# Patient Record
Sex: Male | Born: 1945 | Race: White | Hispanic: No | Marital: Married | Smoking: Former smoker
Health system: Southern US, Community
[De-identification: ages and names within clinical notes are randomized; demographics above are authoritative.]

## PROBLEM LIST (undated history)

## (undated) DIAGNOSIS — E119 Type 2 diabetes mellitus without complications: Secondary | ICD-10-CM

## (undated) DIAGNOSIS — I10 Essential (primary) hypertension: Secondary | ICD-10-CM

## (undated) DIAGNOSIS — J449 Chronic obstructive pulmonary disease, unspecified: Secondary | ICD-10-CM

## (undated) HISTORY — PX: CATARACT EXTRACTION: SUR2

## (undated) HISTORY — PX: CORONARY STENT PLACEMENT: SHX1402

## (undated) HISTORY — PX: COLONOSCOPY: SHX174

## (undated) HISTORY — PX: APPENDECTOMY: SHX54

## (undated) HISTORY — PX: CORONARY ARTERY BYPASS GRAFT: SHX141

## (undated) HISTORY — PX: WRIST SURGERY: SHX841

## (undated) HISTORY — PX: TOTAL KNEE ARTHROPLASTY: SHX125

---

## 2004-01-19 HISTORY — PX: CORONARY STENT PLACEMENT: SHX1402

## 2005-08-16 ENCOUNTER — Other Ambulatory Visit: Payer: Self-pay

## 2005-08-16 ENCOUNTER — Emergency Department: Payer: Self-pay

## 2009-10-08 ENCOUNTER — Emergency Department: Payer: Self-pay | Admitting: Emergency Medicine

## 2013-01-18 DIAGNOSIS — I2699 Other pulmonary embolism without acute cor pulmonale: Secondary | ICD-10-CM

## 2013-01-18 HISTORY — DX: Other pulmonary embolism without acute cor pulmonale: I26.99

## 2014-09-19 ENCOUNTER — Encounter: Payer: Self-pay | Admitting: *Deleted

## 2014-09-19 ENCOUNTER — Emergency Department
Admission: EM | Admit: 2014-09-19 | Discharge: 2014-09-19 | Disposition: A | Discharge status: Home / Self Care | Payer: Commercial Managed Care - HMO | Attending: Emergency Medicine | Admitting: Emergency Medicine

## 2014-09-19 ENCOUNTER — Other Ambulatory Visit: Payer: Self-pay

## 2014-09-19 DIAGNOSIS — I209 Angina pectoris, unspecified: Secondary | ICD-10-CM | POA: Diagnosis not present

## 2014-09-19 DIAGNOSIS — R079 Chest pain, unspecified: Secondary | ICD-10-CM | POA: Diagnosis present

## 2014-09-19 DIAGNOSIS — I208 Other forms of angina pectoris: Secondary | ICD-10-CM

## 2014-09-19 LAB — COMPREHENSIVE METABOLIC PANEL
ALK PHOS: 94 U/L (ref 38–126)
ALT: 35 U/L (ref 17–63)
AST: 34 U/L (ref 15–41)
Albumin: 4.5 g/dL (ref 3.5–5.0)
Anion gap: 11 (ref 5–15)
BUN: 25 mg/dL — AB (ref 6–20)
CALCIUM: 9.1 mg/dL (ref 8.9–10.3)
CO2: 22 mmol/L (ref 22–32)
CREATININE: 1.27 mg/dL — AB (ref 0.61–1.24)
Chloride: 103 mmol/L (ref 101–111)
GFR calc non Af Amer: 56 mL/min — ABNORMAL LOW (ref >60)
Glucose, Bld: 169 mg/dL — ABNORMAL HIGH (ref 65–99)
Potassium: 3.8 mmol/L (ref 3.5–5.1)
SODIUM: 136 mmol/L (ref 135–145)
Total Bilirubin: 0.4 mg/dL (ref 0.3–1.2)
Total Protein: 7.7 g/dL (ref 6.5–8.1)

## 2014-09-19 LAB — TROPONIN I: Troponin I: <0.03 ng/mL (ref <0.031)

## 2014-09-19 LAB — CBC
HCT: 42.1 % (ref 40.0–52.0)
HEMOGLOBIN: 13.8 g/dL (ref 13.0–18.0)
MCH: 29.3 pg (ref 26.0–34.0)
MCHC: 32.9 g/dL (ref 32.0–36.0)
MCV: 89.2 fL (ref 80.0–100.0)
Platelets: 304 10*3/uL (ref 150–440)
RBC: 4.72 MIL/uL (ref 4.40–5.90)
RDW: 15.7 % — ABNORMAL HIGH (ref 11.5–14.5)
WBC: 6.8 10*3/uL (ref 3.8–10.6)

## 2014-09-19 MED ORDER — ACETAMINOPHEN 325 MG PO TABS
650.0000 mg | ORAL_TABLET | Freq: Once | ORAL | Status: AC
  Administered 2014-09-19: 650 mg via ORAL

## 2014-09-19 MED ORDER — ACETAMINOPHEN 325 MG PO TABS
ORAL_TABLET | ORAL | Status: AC
  Administered 2014-09-19: 650 mg via ORAL
  Filled 2014-09-19: qty 2

## 2014-09-19 NOTE — ED Notes (Signed)
Pt to triage via wheelchair.  Pt has intermittent chest pain since last night.  Pt took ntg last night and pain went away.  Pain reoccurred today. Pt took another ntg 45 minutes ago.  No n/v/d.  Pt had episodes of diaphoresis.

## 2014-09-19 NOTE — Discharge Instructions (Signed)
Angina Pectoris  Angina pectoris, often just called angina, is extreme discomfort in your chest, neck, or arm caused by a lack of blood in the middle and thickest layer of your heart wall (myocardium). It may feel like tightness or heavy pressure. It may feel like a crushing or squeezing pain. Some people say it feels like gas or indigestion. It may go down your shoulders, back, and arms. Some people may have symptoms other than pain. These symptoms include fatigue, shortness of breath, cold sweats, or nausea. There are four different types of angina:  · Stable angina--Stable angina usually occurs in episodes of predictable frequency and duration. It usually is brought on by physical activity, emotional stress, or excitement. These are all times when the myocardium needs more oxygen. Stable angina usually lasts a few minutes and often is relieved by taking a medicine that can be taken under your tongue (sublingually). The medicine is called nitroglycerin. Stable angina is caused by a buildup of plaque inside the arteries, which restricts blood flow to the heart muscle (atherosclerosis).  · Unstable angina--Unstable angina can occur even when your body experiences little or no physical exertion. It can occur during sleep. It can also occur at rest. It can suddenly increase in severity or frequency. It might not be relieved by sublingual nitroglycerin. It can last up to 30 minutes. The most common cause of unstable angina is a blood clot that has developed on the top of plaque buildup inside a coronary artery. It can lead to a heart attack if the blood clot completely blocks the artery.  · Microvascular angina--This type of angina is caused by a disorder of tiny blood vessels called arterioles. Microvascular angina is more common in women. The pain may be more severe and last longer than other types of angina pectoris.  · Prinzmetal or variant angina--This type of angina pectoris usually occurs when your body  experiences little or no physical exertion. It especially occurs in the early morning hours. It is caused by a spasm of your coronary artery.  HOME CARE INSTRUCTIONS   · Only take over-the-counter and prescription medicines as directed by your health care provider.  · Stay active or increase your exercise as directed by your health care provider.  · Limit strenuous activity as directed by your health care provider.  · Limit heavy lifting as directed by your health care provider.  · Maintain a healthy weight.  · Learn about and eat heart-healthy foods.  · Do not use any tobacco products including cigarettes, chewing tobacco or electronic cigarettes.  SEEK IMMEDIATE MEDICAL CARE IF:   You experience the following symptoms:  · Chest, neck, deep shoulder, or arm pain or discomfort that lasts more than a few minutes.  · Chest, neck, deep shoulder, or arm pain or discomfort that goes away and comes back, repeatedly.  · Heavy sweating with discomfort, without a noticeable cause.  · Shortness of breath or difficulty breathing.  · Angina that does not get better after a few minutes of rest or after taking sublingual nitroglycerin.  These can all be symptoms of a heart attack, which is a medical emergency! Get medical help at once. Call your local emergency service (911 in [place] immediately. Do not  drive yourself to the hospital and do not  wait to for your symptoms to go away.  MAKE SURE YOU:  · Understand these instructions.  · Will watch your condition.  · Will get help right away if you are not   doing well or get worse.  Document Released: 01/04/2005 Document Revised: 01/09/2013 Document Reviewed: 05/08/2013  Exit[employer]® Patient Information ©2015 [employer]. This information is not intended to replace advice given to you by your health care provider. Make sure you discuss any questions you have with your health care provider.

## 2014-09-19 NOTE — ED Provider Notes (Signed)
[facility] Emergency Department Provider Note  ____________________________________________  Time seen: 4:50 PM  I have reviewed the triage vital signs and the nursing notes.   HISTORY  Chief Complaint Chest Pain    HPI Scott TASKER Sr. is a 69 y.o. male who complains of chest pain. He had an episode last night that was occurred while he was sitting at rest that was non-radiating describe as tightness with no shortness of breath. It felt like his usual angina so he took a nitroglycerin which resolved within a few minutes. He had no other symptoms last night so did not worry about it. Today he was accompanying his wife to her doctor's office when he became very hot and diaphoretic short of breath and recurrent of the chest pain and tightness. It was again nonradiating. He took 243 of aspirin and another nitroglycerin which caused the pain to resolve again. He is in the ED for evaluation of this recurrent pain. His primary care doctor is Cleatis Polka and he gets his care at the [place]. He denies any exertional or pleuritic symptoms recently. No recent illness.     No past medical history on file. CAD, angina  There are no active problems to display for this patient.    No past surgical history on file.   No current outpatient prescriptions on file. Nitroglycerin  Allergies Prednisone and Nitroglycerin The patient does not have a nitroglycerin allergy. He takes this at home.  No family history on file.  Social History Social History  Substance Use Topics  . Smoking status: Never Smoker   . Smokeless tobacco: Not on file  . Alcohol Use: No    Review of Systems  Constitutional:   No fever or chills. No weight changes Eyes:   No blurry vision or double vision.  ENT:   No sore throat. Cardiovascular:   Positive chest pain as above. Respiratory:   No dyspnea or cough. Gastrointestinal:   Negative for abdominal pain, vomiting and diarrhea.  No  BRBPR or melena. Genitourinary:   Negative for dysuria, urinary retention, bloody urine, or difficulty urinating. Musculoskeletal:   Negative for back pain. No joint swelling or pain. Skin:   Negative for rash. Neurological:   Negative for headaches, focal weakness or numbness. Psychiatric:  No anxiety or depression.   Endocrine:  No hot/cold intolerance, changes in energy, or sleep difficulty.  10-point ROS otherwise negative.  ____________________________________________   PHYSICAL EXAM:  VITAL SIGNS: ED Triage Vitals  Enc Vitals Group     BP 09/19/14 1641 121/75 mmHg     Pulse Rate 09/19/14 1641 93     Resp 09/19/14 1641 22     Temp 09/19/14 1641 98.1 F (36.7 C)     Temp Source 09/19/14 1641 Oral     SpO2 09/19/14 1641 95 %     Weight 09/19/14 1634 260 lb (117.935 kg)     Height 09/19/14 1634  (1.854 m)     Head Cir --      Peak Flow --      Pain Score 09/19/14 1634 2     Pain Loc --      Pain Edu? --      Excl. in GC? --      Constitutional:   Alert and oriented. Well appearing and in no distress. Eyes:   No scleral icterus. No conjunctival pallor. PERRL. EOMI ENT   Head:   Normocephalic and atraumatic.   Nose:   No  congestion/rhinnorhea. No septal hematoma   Mouth/Throat:   MMM, no pharyngeal erythema. No peritonsillar mass. No uvula shift.   Neck:   No stridor. No SubQ emphysema. No meningismus. Hematological/Lymphatic/Immunilogical:   No cervical lymphadenopathy. Cardiovascular:   RRR. Normal and symmetric distal pulses are present in all extremities. No murmurs, rubs, or gallops. Respiratory:   Normal respiratory effort without tachypnea nor retractions. Breath sounds are clear and equal bilaterally. No wheezes/rales/rhonchi. Gastrointestinal:   Soft and nontender. No distention. There is no CVA tenderness.  No rebound, rigidity, or guarding. Genitourinary:   deferred Musculoskeletal:   Nontender with normal range of motion in all  extremities. No joint effusions.  No lower extremity tenderness.  No edema. Neurologic:   Normal speech and language.  CN 2-10 normal. Motor grossly intact. No pronator drift.  Normal gait. No gross focal neurologic deficits are appreciated.  Skin:    Skin is warm, dry and intact. No rash noted.  No petechiae, purpura, or bullae. Psychiatric:   Mood and affect are normal. Speech and behavior are normal. Patient exhibits appropriate insight and judgment.  ____________________________________________    LABS (pertinent positives/negatives) (all labs ordered are listed, but only abnormal results are displayed) Labs Reviewed  CBC - Abnormal; Notable for the following:    RDW 15.7 (*)    All other components within normal limits  COMPREHENSIVE METABOLIC PANEL - Abnormal; Notable for the following:    Glucose, Bld 169 (*)    BUN 25 (*)    Creatinine, Ser 1.27 (*)    GFR calc non Af Amer 56 (*)    All other components within normal limits  TROPONIN I  TROPONIN I   ____________________________________________   EKG  Interpreted by me Normal sinus rhythm rate 86, normal axis intervals.  There is poor R progression in the anterior precordial leads. There is an incomplete right bundle branch block. Normal ST segments and T waves.  ____________________________________________    RADIOLOGY    ____________________________________________   PROCEDURES   ____________________________________________   INITIAL IMPRESSION / ASSESSMENT AND PLAN / ED COURSE  Pertinent labs & imaging results that were available during my care of the patient were reviewed by me and considered in my medical decision making (see chart for details).  Patient presents with episodes of chest pain at rest. He is now chest pain-free after taking a single nitroglycerin. We'll check troponin 2 and if he remains chest pain-free plan for outpatient follow-up.  ----------------------------------------- 8:39  PM on 09/19/2014 -----------------------------------------  Initial labs and repeat troponin all negative. Patient has not had any recurrent chest pain in the emergency department. As symptoms have been brief and rapidly resolved with a single dose of nitroglycerin and not associated with any changes in cardiac enzymes or EKG, the patient's episodes are low risk and suitable for outpatient follow-up. Instructed him to follow up with the [facility] cardiology clinic tomorrow, and if unable to see them he should pursue follow-up with our local cardiologists. Low suspicion for ACS PE TAD pneumothorax carditis mediastinitis pneumonia or sepsis. No evidence of AAA or abdominal pathology.   ____________________________________________   FINAL CLINICAL IMPRESSION(S) / ED DIAGNOSES  Final diagnoses:  Angina at rest      Sharman Cheek, MD 09/19/14 2041

## 2015-01-20 DIAGNOSIS — I5042 Chronic combined systolic (congestive) and diastolic (congestive) heart failure: Secondary | ICD-10-CM | POA: Diagnosis not present

## 2015-01-20 DIAGNOSIS — F419 Anxiety disorder, unspecified: Secondary | ICD-10-CM | POA: Diagnosis not present

## 2015-01-20 DIAGNOSIS — J449 Chronic obstructive pulmonary disease, unspecified: Secondary | ICD-10-CM | POA: Diagnosis not present

## 2015-01-20 DIAGNOSIS — I110 Hypertensive heart disease with heart failure: Secondary | ICD-10-CM | POA: Diagnosis not present

## 2015-01-20 DIAGNOSIS — I214 Non-ST elevation (NSTEMI) myocardial infarction: Secondary | ICD-10-CM | POA: Diagnosis not present

## 2015-01-20 DIAGNOSIS — I2510 Atherosclerotic heart disease of native coronary artery without angina pectoris: Secondary | ICD-10-CM | POA: Diagnosis not present

## 2015-01-20 DIAGNOSIS — M1991 Primary osteoarthritis, unspecified site: Secondary | ICD-10-CM | POA: Diagnosis not present

## 2015-01-20 DIAGNOSIS — E119 Type 2 diabetes mellitus without complications: Secondary | ICD-10-CM | POA: Diagnosis not present

## 2015-01-20 DIAGNOSIS — Z48812 Encounter for surgical aftercare following surgery on the circulatory system: Secondary | ICD-10-CM | POA: Diagnosis not present

## 2015-01-21 DIAGNOSIS — Z48812 Encounter for surgical aftercare following surgery on the circulatory system: Secondary | ICD-10-CM | POA: Diagnosis not present

## 2015-01-21 DIAGNOSIS — I110 Hypertensive heart disease with heart failure: Secondary | ICD-10-CM | POA: Diagnosis not present

## 2015-01-21 DIAGNOSIS — M1991 Primary osteoarthritis, unspecified site: Secondary | ICD-10-CM | POA: Diagnosis not present

## 2015-01-21 DIAGNOSIS — E119 Type 2 diabetes mellitus without complications: Secondary | ICD-10-CM | POA: Diagnosis not present

## 2015-01-21 DIAGNOSIS — F419 Anxiety disorder, unspecified: Secondary | ICD-10-CM | POA: Diagnosis not present

## 2015-01-21 DIAGNOSIS — J449 Chronic obstructive pulmonary disease, unspecified: Secondary | ICD-10-CM | POA: Diagnosis not present

## 2015-01-21 DIAGNOSIS — I214 Non-ST elevation (NSTEMI) myocardial infarction: Secondary | ICD-10-CM | POA: Diagnosis not present

## 2015-01-21 DIAGNOSIS — I5042 Chronic combined systolic (congestive) and diastolic (congestive) heart failure: Secondary | ICD-10-CM | POA: Diagnosis not present

## 2015-01-21 DIAGNOSIS — I2510 Atherosclerotic heart disease of native coronary artery without angina pectoris: Secondary | ICD-10-CM | POA: Diagnosis not present

## 2015-01-22 DIAGNOSIS — I214 Non-ST elevation (NSTEMI) myocardial infarction: Secondary | ICD-10-CM | POA: Diagnosis not present

## 2015-01-22 DIAGNOSIS — I110 Hypertensive heart disease with heart failure: Secondary | ICD-10-CM | POA: Diagnosis not present

## 2015-01-22 DIAGNOSIS — J449 Chronic obstructive pulmonary disease, unspecified: Secondary | ICD-10-CM | POA: Diagnosis not present

## 2015-01-22 DIAGNOSIS — M1991 Primary osteoarthritis, unspecified site: Secondary | ICD-10-CM | POA: Diagnosis not present

## 2015-01-22 DIAGNOSIS — E119 Type 2 diabetes mellitus without complications: Secondary | ICD-10-CM | POA: Diagnosis not present

## 2015-01-22 DIAGNOSIS — F419 Anxiety disorder, unspecified: Secondary | ICD-10-CM | POA: Diagnosis not present

## 2015-01-22 DIAGNOSIS — I2510 Atherosclerotic heart disease of native coronary artery without angina pectoris: Secondary | ICD-10-CM | POA: Diagnosis not present

## 2015-01-22 DIAGNOSIS — Z48812 Encounter for surgical aftercare following surgery on the circulatory system: Secondary | ICD-10-CM | POA: Diagnosis not present

## 2015-01-22 DIAGNOSIS — I5042 Chronic combined systolic (congestive) and diastolic (congestive) heart failure: Secondary | ICD-10-CM | POA: Diagnosis not present

## 2015-01-26 DIAGNOSIS — F419 Anxiety disorder, unspecified: Secondary | ICD-10-CM | POA: Diagnosis not present

## 2015-01-26 DIAGNOSIS — M1991 Primary osteoarthritis, unspecified site: Secondary | ICD-10-CM | POA: Diagnosis not present

## 2015-01-26 DIAGNOSIS — J449 Chronic obstructive pulmonary disease, unspecified: Secondary | ICD-10-CM | POA: Diagnosis not present

## 2015-01-26 DIAGNOSIS — I2510 Atherosclerotic heart disease of native coronary artery without angina pectoris: Secondary | ICD-10-CM | POA: Diagnosis not present

## 2015-01-26 DIAGNOSIS — E119 Type 2 diabetes mellitus without complications: Secondary | ICD-10-CM | POA: Diagnosis not present

## 2015-01-26 DIAGNOSIS — Z48812 Encounter for surgical aftercare following surgery on the circulatory system: Secondary | ICD-10-CM | POA: Diagnosis not present

## 2015-01-26 DIAGNOSIS — I110 Hypertensive heart disease with heart failure: Secondary | ICD-10-CM | POA: Diagnosis not present

## 2015-01-26 DIAGNOSIS — I5042 Chronic combined systolic (congestive) and diastolic (congestive) heart failure: Secondary | ICD-10-CM | POA: Diagnosis not present

## 2015-01-26 DIAGNOSIS — I214 Non-ST elevation (NSTEMI) myocardial infarction: Secondary | ICD-10-CM | POA: Diagnosis not present

## 2015-01-27 DIAGNOSIS — E119 Type 2 diabetes mellitus without complications: Secondary | ICD-10-CM | POA: Diagnosis not present

## 2015-01-27 DIAGNOSIS — I214 Non-ST elevation (NSTEMI) myocardial infarction: Secondary | ICD-10-CM | POA: Diagnosis not present

## 2015-01-27 DIAGNOSIS — I5042 Chronic combined systolic (congestive) and diastolic (congestive) heart failure: Secondary | ICD-10-CM | POA: Diagnosis not present

## 2015-01-27 DIAGNOSIS — I110 Hypertensive heart disease with heart failure: Secondary | ICD-10-CM | POA: Diagnosis not present

## 2015-01-27 DIAGNOSIS — J449 Chronic obstructive pulmonary disease, unspecified: Secondary | ICD-10-CM | POA: Diagnosis not present

## 2015-01-27 DIAGNOSIS — I2510 Atherosclerotic heart disease of native coronary artery without angina pectoris: Secondary | ICD-10-CM | POA: Diagnosis not present

## 2015-01-27 DIAGNOSIS — M1991 Primary osteoarthritis, unspecified site: Secondary | ICD-10-CM | POA: Diagnosis not present

## 2015-01-27 DIAGNOSIS — Z48812 Encounter for surgical aftercare following surgery on the circulatory system: Secondary | ICD-10-CM | POA: Diagnosis not present

## 2015-01-27 DIAGNOSIS — F419 Anxiety disorder, unspecified: Secondary | ICD-10-CM | POA: Diagnosis not present

## 2015-01-28 DIAGNOSIS — I214 Non-ST elevation (NSTEMI) myocardial infarction: Secondary | ICD-10-CM | POA: Diagnosis not present

## 2015-01-28 DIAGNOSIS — E119 Type 2 diabetes mellitus without complications: Secondary | ICD-10-CM | POA: Diagnosis not present

## 2015-01-28 DIAGNOSIS — I2510 Atherosclerotic heart disease of native coronary artery without angina pectoris: Secondary | ICD-10-CM | POA: Diagnosis not present

## 2015-01-28 DIAGNOSIS — M1991 Primary osteoarthritis, unspecified site: Secondary | ICD-10-CM | POA: Diagnosis not present

## 2015-01-28 DIAGNOSIS — I110 Hypertensive heart disease with heart failure: Secondary | ICD-10-CM | POA: Diagnosis not present

## 2015-01-28 DIAGNOSIS — J449 Chronic obstructive pulmonary disease, unspecified: Secondary | ICD-10-CM | POA: Diagnosis not present

## 2015-01-28 DIAGNOSIS — Z48812 Encounter for surgical aftercare following surgery on the circulatory system: Secondary | ICD-10-CM | POA: Diagnosis not present

## 2015-01-28 DIAGNOSIS — F419 Anxiety disorder, unspecified: Secondary | ICD-10-CM | POA: Diagnosis not present

## 2015-01-28 DIAGNOSIS — I5042 Chronic combined systolic (congestive) and diastolic (congestive) heart failure: Secondary | ICD-10-CM | POA: Diagnosis not present

## 2015-01-30 DIAGNOSIS — Z48812 Encounter for surgical aftercare following surgery on the circulatory system: Secondary | ICD-10-CM | POA: Diagnosis not present

## 2015-01-30 DIAGNOSIS — I2510 Atherosclerotic heart disease of native coronary artery without angina pectoris: Secondary | ICD-10-CM | POA: Diagnosis not present

## 2015-01-30 DIAGNOSIS — E119 Type 2 diabetes mellitus without complications: Secondary | ICD-10-CM | POA: Diagnosis not present

## 2015-01-30 DIAGNOSIS — I110 Hypertensive heart disease with heart failure: Secondary | ICD-10-CM | POA: Diagnosis not present

## 2015-01-30 DIAGNOSIS — J449 Chronic obstructive pulmonary disease, unspecified: Secondary | ICD-10-CM | POA: Diagnosis not present

## 2015-01-30 DIAGNOSIS — F419 Anxiety disorder, unspecified: Secondary | ICD-10-CM | POA: Diagnosis not present

## 2015-01-30 DIAGNOSIS — I5042 Chronic combined systolic (congestive) and diastolic (congestive) heart failure: Secondary | ICD-10-CM | POA: Diagnosis not present

## 2015-01-30 DIAGNOSIS — I214 Non-ST elevation (NSTEMI) myocardial infarction: Secondary | ICD-10-CM | POA: Diagnosis not present

## 2015-01-30 DIAGNOSIS — M1991 Primary osteoarthritis, unspecified site: Secondary | ICD-10-CM | POA: Diagnosis not present

## 2015-02-02 DIAGNOSIS — I5042 Chronic combined systolic (congestive) and diastolic (congestive) heart failure: Secondary | ICD-10-CM | POA: Diagnosis not present

## 2015-02-02 DIAGNOSIS — M1991 Primary osteoarthritis, unspecified site: Secondary | ICD-10-CM | POA: Diagnosis not present

## 2015-02-02 DIAGNOSIS — I110 Hypertensive heart disease with heart failure: Secondary | ICD-10-CM | POA: Diagnosis not present

## 2015-02-02 DIAGNOSIS — J449 Chronic obstructive pulmonary disease, unspecified: Secondary | ICD-10-CM | POA: Diagnosis not present

## 2015-02-02 DIAGNOSIS — I214 Non-ST elevation (NSTEMI) myocardial infarction: Secondary | ICD-10-CM | POA: Diagnosis not present

## 2015-02-02 DIAGNOSIS — Z48812 Encounter for surgical aftercare following surgery on the circulatory system: Secondary | ICD-10-CM | POA: Diagnosis not present

## 2015-02-02 DIAGNOSIS — F419 Anxiety disorder, unspecified: Secondary | ICD-10-CM | POA: Diagnosis not present

## 2015-02-02 DIAGNOSIS — E119 Type 2 diabetes mellitus without complications: Secondary | ICD-10-CM | POA: Diagnosis not present

## 2015-02-02 DIAGNOSIS — I2510 Atherosclerotic heart disease of native coronary artery without angina pectoris: Secondary | ICD-10-CM | POA: Diagnosis not present

## 2015-02-03 DIAGNOSIS — I5042 Chronic combined systolic (congestive) and diastolic (congestive) heart failure: Secondary | ICD-10-CM | POA: Diagnosis not present

## 2015-02-03 DIAGNOSIS — E119 Type 2 diabetes mellitus without complications: Secondary | ICD-10-CM | POA: Diagnosis not present

## 2015-02-03 DIAGNOSIS — Z48812 Encounter for surgical aftercare following surgery on the circulatory system: Secondary | ICD-10-CM | POA: Diagnosis not present

## 2015-02-03 DIAGNOSIS — M1991 Primary osteoarthritis, unspecified site: Secondary | ICD-10-CM | POA: Diagnosis not present

## 2015-02-03 DIAGNOSIS — F419 Anxiety disorder, unspecified: Secondary | ICD-10-CM | POA: Diagnosis not present

## 2015-02-03 DIAGNOSIS — J449 Chronic obstructive pulmonary disease, unspecified: Secondary | ICD-10-CM | POA: Diagnosis not present

## 2015-02-03 DIAGNOSIS — I2510 Atherosclerotic heart disease of native coronary artery without angina pectoris: Secondary | ICD-10-CM | POA: Diagnosis not present

## 2015-02-03 DIAGNOSIS — I214 Non-ST elevation (NSTEMI) myocardial infarction: Secondary | ICD-10-CM | POA: Diagnosis not present

## 2015-02-03 DIAGNOSIS — I110 Hypertensive heart disease with heart failure: Secondary | ICD-10-CM | POA: Diagnosis not present

## 2015-02-06 DIAGNOSIS — Z48812 Encounter for surgical aftercare following surgery on the circulatory system: Secondary | ICD-10-CM | POA: Diagnosis not present

## 2015-02-06 DIAGNOSIS — I110 Hypertensive heart disease with heart failure: Secondary | ICD-10-CM | POA: Diagnosis not present

## 2015-02-06 DIAGNOSIS — J449 Chronic obstructive pulmonary disease, unspecified: Secondary | ICD-10-CM | POA: Diagnosis not present

## 2015-02-06 DIAGNOSIS — F419 Anxiety disorder, unspecified: Secondary | ICD-10-CM | POA: Diagnosis not present

## 2015-02-06 DIAGNOSIS — E119 Type 2 diabetes mellitus without complications: Secondary | ICD-10-CM | POA: Diagnosis not present

## 2015-02-06 DIAGNOSIS — I2510 Atherosclerotic heart disease of native coronary artery without angina pectoris: Secondary | ICD-10-CM | POA: Diagnosis not present

## 2015-02-06 DIAGNOSIS — I214 Non-ST elevation (NSTEMI) myocardial infarction: Secondary | ICD-10-CM | POA: Diagnosis not present

## 2015-02-06 DIAGNOSIS — I5042 Chronic combined systolic (congestive) and diastolic (congestive) heart failure: Secondary | ICD-10-CM | POA: Diagnosis not present

## 2015-02-06 DIAGNOSIS — M1991 Primary osteoarthritis, unspecified site: Secondary | ICD-10-CM | POA: Diagnosis not present

## 2015-02-10 DIAGNOSIS — I110 Hypertensive heart disease with heart failure: Secondary | ICD-10-CM | POA: Diagnosis not present

## 2015-02-10 DIAGNOSIS — F419 Anxiety disorder, unspecified: Secondary | ICD-10-CM | POA: Diagnosis not present

## 2015-02-10 DIAGNOSIS — E119 Type 2 diabetes mellitus without complications: Secondary | ICD-10-CM | POA: Diagnosis not present

## 2015-02-10 DIAGNOSIS — Z48812 Encounter for surgical aftercare following surgery on the circulatory system: Secondary | ICD-10-CM | POA: Diagnosis not present

## 2015-02-10 DIAGNOSIS — J449 Chronic obstructive pulmonary disease, unspecified: Secondary | ICD-10-CM | POA: Diagnosis not present

## 2015-02-10 DIAGNOSIS — I214 Non-ST elevation (NSTEMI) myocardial infarction: Secondary | ICD-10-CM | POA: Diagnosis not present

## 2015-02-10 DIAGNOSIS — I2510 Atherosclerotic heart disease of native coronary artery without angina pectoris: Secondary | ICD-10-CM | POA: Diagnosis not present

## 2015-02-10 DIAGNOSIS — I5042 Chronic combined systolic (congestive) and diastolic (congestive) heart failure: Secondary | ICD-10-CM | POA: Diagnosis not present

## 2015-02-10 DIAGNOSIS — M1991 Primary osteoarthritis, unspecified site: Secondary | ICD-10-CM | POA: Diagnosis not present

## 2015-02-11 DIAGNOSIS — E119 Type 2 diabetes mellitus without complications: Secondary | ICD-10-CM | POA: Diagnosis not present

## 2015-02-11 DIAGNOSIS — I214 Non-ST elevation (NSTEMI) myocardial infarction: Secondary | ICD-10-CM | POA: Diagnosis not present

## 2015-02-11 DIAGNOSIS — F419 Anxiety disorder, unspecified: Secondary | ICD-10-CM | POA: Diagnosis not present

## 2015-02-11 DIAGNOSIS — J449 Chronic obstructive pulmonary disease, unspecified: Secondary | ICD-10-CM | POA: Diagnosis not present

## 2015-02-11 DIAGNOSIS — Z48812 Encounter for surgical aftercare following surgery on the circulatory system: Secondary | ICD-10-CM | POA: Diagnosis not present

## 2015-02-11 DIAGNOSIS — I2510 Atherosclerotic heart disease of native coronary artery without angina pectoris: Secondary | ICD-10-CM | POA: Diagnosis not present

## 2015-02-11 DIAGNOSIS — I5042 Chronic combined systolic (congestive) and diastolic (congestive) heart failure: Secondary | ICD-10-CM | POA: Diagnosis not present

## 2015-02-11 DIAGNOSIS — I110 Hypertensive heart disease with heart failure: Secondary | ICD-10-CM | POA: Diagnosis not present

## 2015-02-11 DIAGNOSIS — M1991 Primary osteoarthritis, unspecified site: Secondary | ICD-10-CM | POA: Diagnosis not present

## 2015-02-12 DIAGNOSIS — J449 Chronic obstructive pulmonary disease, unspecified: Secondary | ICD-10-CM | POA: Diagnosis not present

## 2015-02-12 DIAGNOSIS — I110 Hypertensive heart disease with heart failure: Secondary | ICD-10-CM | POA: Diagnosis not present

## 2015-02-12 DIAGNOSIS — I214 Non-ST elevation (NSTEMI) myocardial infarction: Secondary | ICD-10-CM | POA: Diagnosis not present

## 2015-02-12 DIAGNOSIS — Z48812 Encounter for surgical aftercare following surgery on the circulatory system: Secondary | ICD-10-CM | POA: Diagnosis not present

## 2015-02-12 DIAGNOSIS — I2510 Atherosclerotic heart disease of native coronary artery without angina pectoris: Secondary | ICD-10-CM | POA: Diagnosis not present

## 2015-02-12 DIAGNOSIS — I5042 Chronic combined systolic (congestive) and diastolic (congestive) heart failure: Secondary | ICD-10-CM | POA: Diagnosis not present

## 2015-02-12 DIAGNOSIS — E119 Type 2 diabetes mellitus without complications: Secondary | ICD-10-CM | POA: Diagnosis not present

## 2015-02-12 DIAGNOSIS — M1991 Primary osteoarthritis, unspecified site: Secondary | ICD-10-CM | POA: Diagnosis not present

## 2015-02-12 DIAGNOSIS — F419 Anxiety disorder, unspecified: Secondary | ICD-10-CM | POA: Diagnosis not present

## 2015-02-16 DIAGNOSIS — E119 Type 2 diabetes mellitus without complications: Secondary | ICD-10-CM | POA: Diagnosis not present

## 2015-02-16 DIAGNOSIS — I5042 Chronic combined systolic (congestive) and diastolic (congestive) heart failure: Secondary | ICD-10-CM | POA: Diagnosis not present

## 2015-02-16 DIAGNOSIS — Z48812 Encounter for surgical aftercare following surgery on the circulatory system: Secondary | ICD-10-CM | POA: Diagnosis not present

## 2015-02-16 DIAGNOSIS — I2510 Atherosclerotic heart disease of native coronary artery without angina pectoris: Secondary | ICD-10-CM | POA: Diagnosis not present

## 2015-02-16 DIAGNOSIS — F419 Anxiety disorder, unspecified: Secondary | ICD-10-CM | POA: Diagnosis not present

## 2015-02-16 DIAGNOSIS — I214 Non-ST elevation (NSTEMI) myocardial infarction: Secondary | ICD-10-CM | POA: Diagnosis not present

## 2015-02-16 DIAGNOSIS — J449 Chronic obstructive pulmonary disease, unspecified: Secondary | ICD-10-CM | POA: Diagnosis not present

## 2015-02-16 DIAGNOSIS — M1991 Primary osteoarthritis, unspecified site: Secondary | ICD-10-CM | POA: Diagnosis not present

## 2015-02-16 DIAGNOSIS — I110 Hypertensive heart disease with heart failure: Secondary | ICD-10-CM | POA: Diagnosis not present

## 2015-02-17 DIAGNOSIS — I110 Hypertensive heart disease with heart failure: Secondary | ICD-10-CM | POA: Diagnosis not present

## 2015-02-17 DIAGNOSIS — I5042 Chronic combined systolic (congestive) and diastolic (congestive) heart failure: Secondary | ICD-10-CM | POA: Diagnosis not present

## 2015-02-17 DIAGNOSIS — I214 Non-ST elevation (NSTEMI) myocardial infarction: Secondary | ICD-10-CM | POA: Diagnosis not present

## 2015-02-17 DIAGNOSIS — I2510 Atherosclerotic heart disease of native coronary artery without angina pectoris: Secondary | ICD-10-CM | POA: Diagnosis not present

## 2015-02-17 DIAGNOSIS — M1991 Primary osteoarthritis, unspecified site: Secondary | ICD-10-CM | POA: Diagnosis not present

## 2015-02-17 DIAGNOSIS — E119 Type 2 diabetes mellitus without complications: Secondary | ICD-10-CM | POA: Diagnosis not present

## 2015-02-17 DIAGNOSIS — F419 Anxiety disorder, unspecified: Secondary | ICD-10-CM | POA: Diagnosis not present

## 2015-02-17 DIAGNOSIS — J449 Chronic obstructive pulmonary disease, unspecified: Secondary | ICD-10-CM | POA: Diagnosis not present

## 2015-02-17 DIAGNOSIS — Z48812 Encounter for surgical aftercare following surgery on the circulatory system: Secondary | ICD-10-CM | POA: Diagnosis not present

## 2015-02-19 DIAGNOSIS — F419 Anxiety disorder, unspecified: Secondary | ICD-10-CM | POA: Diagnosis not present

## 2015-02-19 DIAGNOSIS — E119 Type 2 diabetes mellitus without complications: Secondary | ICD-10-CM | POA: Diagnosis not present

## 2015-02-19 DIAGNOSIS — I2510 Atherosclerotic heart disease of native coronary artery without angina pectoris: Secondary | ICD-10-CM | POA: Diagnosis not present

## 2015-02-19 DIAGNOSIS — I110 Hypertensive heart disease with heart failure: Secondary | ICD-10-CM | POA: Diagnosis not present

## 2015-02-19 DIAGNOSIS — I214 Non-ST elevation (NSTEMI) myocardial infarction: Secondary | ICD-10-CM | POA: Diagnosis not present

## 2015-02-19 DIAGNOSIS — I5042 Chronic combined systolic (congestive) and diastolic (congestive) heart failure: Secondary | ICD-10-CM | POA: Diagnosis not present

## 2015-02-19 DIAGNOSIS — J449 Chronic obstructive pulmonary disease, unspecified: Secondary | ICD-10-CM | POA: Diagnosis not present

## 2015-02-19 DIAGNOSIS — M1991 Primary osteoarthritis, unspecified site: Secondary | ICD-10-CM | POA: Diagnosis not present

## 2015-02-19 DIAGNOSIS — Z48812 Encounter for surgical aftercare following surgery on the circulatory system: Secondary | ICD-10-CM | POA: Diagnosis not present

## 2015-02-23 DIAGNOSIS — I2510 Atherosclerotic heart disease of native coronary artery without angina pectoris: Secondary | ICD-10-CM | POA: Diagnosis not present

## 2015-02-23 DIAGNOSIS — J449 Chronic obstructive pulmonary disease, unspecified: Secondary | ICD-10-CM | POA: Diagnosis not present

## 2015-02-23 DIAGNOSIS — Z48812 Encounter for surgical aftercare following surgery on the circulatory system: Secondary | ICD-10-CM | POA: Diagnosis not present

## 2015-02-23 DIAGNOSIS — I110 Hypertensive heart disease with heart failure: Secondary | ICD-10-CM | POA: Diagnosis not present

## 2015-02-23 DIAGNOSIS — I214 Non-ST elevation (NSTEMI) myocardial infarction: Secondary | ICD-10-CM | POA: Diagnosis not present

## 2015-02-23 DIAGNOSIS — F419 Anxiety disorder, unspecified: Secondary | ICD-10-CM | POA: Diagnosis not present

## 2015-02-23 DIAGNOSIS — M1991 Primary osteoarthritis, unspecified site: Secondary | ICD-10-CM | POA: Diagnosis not present

## 2015-02-23 DIAGNOSIS — E119 Type 2 diabetes mellitus without complications: Secondary | ICD-10-CM | POA: Diagnosis not present

## 2015-02-23 DIAGNOSIS — I5042 Chronic combined systolic (congestive) and diastolic (congestive) heart failure: Secondary | ICD-10-CM | POA: Diagnosis not present

## 2015-02-25 DIAGNOSIS — I2510 Atherosclerotic heart disease of native coronary artery without angina pectoris: Secondary | ICD-10-CM | POA: Diagnosis not present

## 2015-02-25 DIAGNOSIS — I5042 Chronic combined systolic (congestive) and diastolic (congestive) heart failure: Secondary | ICD-10-CM | POA: Diagnosis not present

## 2015-02-25 DIAGNOSIS — Z48812 Encounter for surgical aftercare following surgery on the circulatory system: Secondary | ICD-10-CM | POA: Diagnosis not present

## 2015-02-25 DIAGNOSIS — F419 Anxiety disorder, unspecified: Secondary | ICD-10-CM | POA: Diagnosis not present

## 2015-02-25 DIAGNOSIS — I214 Non-ST elevation (NSTEMI) myocardial infarction: Secondary | ICD-10-CM | POA: Diagnosis not present

## 2015-02-25 DIAGNOSIS — J449 Chronic obstructive pulmonary disease, unspecified: Secondary | ICD-10-CM | POA: Diagnosis not present

## 2015-02-25 DIAGNOSIS — I110 Hypertensive heart disease with heart failure: Secondary | ICD-10-CM | POA: Diagnosis not present

## 2015-02-25 DIAGNOSIS — E119 Type 2 diabetes mellitus without complications: Secondary | ICD-10-CM | POA: Diagnosis not present

## 2015-02-25 DIAGNOSIS — M1991 Primary osteoarthritis, unspecified site: Secondary | ICD-10-CM | POA: Diagnosis not present

## 2015-03-03 DIAGNOSIS — M1991 Primary osteoarthritis, unspecified site: Secondary | ICD-10-CM | POA: Diagnosis not present

## 2015-03-03 DIAGNOSIS — F419 Anxiety disorder, unspecified: Secondary | ICD-10-CM | POA: Diagnosis not present

## 2015-03-03 DIAGNOSIS — I2510 Atherosclerotic heart disease of native coronary artery without angina pectoris: Secondary | ICD-10-CM | POA: Diagnosis not present

## 2015-03-03 DIAGNOSIS — Z48812 Encounter for surgical aftercare following surgery on the circulatory system: Secondary | ICD-10-CM | POA: Diagnosis not present

## 2015-03-03 DIAGNOSIS — E119 Type 2 diabetes mellitus without complications: Secondary | ICD-10-CM | POA: Diagnosis not present

## 2015-03-03 DIAGNOSIS — I214 Non-ST elevation (NSTEMI) myocardial infarction: Secondary | ICD-10-CM | POA: Diagnosis not present

## 2015-03-03 DIAGNOSIS — I5042 Chronic combined systolic (congestive) and diastolic (congestive) heart failure: Secondary | ICD-10-CM | POA: Diagnosis not present

## 2015-03-03 DIAGNOSIS — J449 Chronic obstructive pulmonary disease, unspecified: Secondary | ICD-10-CM | POA: Diagnosis not present

## 2015-03-03 DIAGNOSIS — I110 Hypertensive heart disease with heart failure: Secondary | ICD-10-CM | POA: Diagnosis not present

## 2015-03-06 DIAGNOSIS — I5042 Chronic combined systolic (congestive) and diastolic (congestive) heart failure: Secondary | ICD-10-CM | POA: Diagnosis not present

## 2015-03-06 DIAGNOSIS — I214 Non-ST elevation (NSTEMI) myocardial infarction: Secondary | ICD-10-CM | POA: Diagnosis not present

## 2015-03-06 DIAGNOSIS — Z48812 Encounter for surgical aftercare following surgery on the circulatory system: Secondary | ICD-10-CM | POA: Diagnosis not present

## 2015-03-06 DIAGNOSIS — J449 Chronic obstructive pulmonary disease, unspecified: Secondary | ICD-10-CM | POA: Diagnosis not present

## 2015-03-06 DIAGNOSIS — I110 Hypertensive heart disease with heart failure: Secondary | ICD-10-CM | POA: Diagnosis not present

## 2015-03-06 DIAGNOSIS — E119 Type 2 diabetes mellitus without complications: Secondary | ICD-10-CM | POA: Diagnosis not present

## 2015-03-06 DIAGNOSIS — M1991 Primary osteoarthritis, unspecified site: Secondary | ICD-10-CM | POA: Diagnosis not present

## 2015-03-06 DIAGNOSIS — I2510 Atherosclerotic heart disease of native coronary artery without angina pectoris: Secondary | ICD-10-CM | POA: Diagnosis not present

## 2015-03-06 DIAGNOSIS — F419 Anxiety disorder, unspecified: Secondary | ICD-10-CM | POA: Diagnosis not present

## 2015-12-28 DIAGNOSIS — Z87442 Personal history of urinary calculi: Secondary | ICD-10-CM | POA: Insufficient documentation

## 2015-12-28 DIAGNOSIS — L219 Seborrheic dermatitis, unspecified: Secondary | ICD-10-CM | POA: Insufficient documentation

## 2015-12-28 DIAGNOSIS — I214 Non-ST elevation (NSTEMI) myocardial infarction: Secondary | ICD-10-CM | POA: Diagnosis not present

## 2015-12-28 DIAGNOSIS — I2510 Atherosclerotic heart disease of native coronary artery without angina pectoris: Secondary | ICD-10-CM | POA: Diagnosis not present

## 2015-12-28 DIAGNOSIS — D62 Acute posthemorrhagic anemia: Secondary | ICD-10-CM | POA: Diagnosis not present

## 2015-12-28 DIAGNOSIS — Z955 Presence of coronary angioplasty implant and graft: Secondary | ICD-10-CM | POA: Diagnosis not present

## 2015-12-28 DIAGNOSIS — Z4682 Encounter for fitting and adjustment of non-vascular catheter: Secondary | ICD-10-CM | POA: Diagnosis not present

## 2015-12-28 DIAGNOSIS — Z01810 Encounter for preprocedural cardiovascular examination: Secondary | ICD-10-CM | POA: Diagnosis not present

## 2015-12-28 DIAGNOSIS — N200 Calculus of kidney: Secondary | ICD-10-CM | POA: Diagnosis not present

## 2015-12-28 DIAGNOSIS — E785 Hyperlipidemia, unspecified: Secondary | ICD-10-CM | POA: Insufficient documentation

## 2015-12-28 DIAGNOSIS — F419 Anxiety disorder, unspecified: Secondary | ICD-10-CM | POA: Insufficient documentation

## 2015-12-28 DIAGNOSIS — G4700 Insomnia, unspecified: Secondary | ICD-10-CM | POA: Insufficient documentation

## 2015-12-28 DIAGNOSIS — H814 Vertigo of central origin: Secondary | ICD-10-CM | POA: Insufficient documentation

## 2015-12-28 DIAGNOSIS — R918 Other nonspecific abnormal finding of lung field: Secondary | ICD-10-CM | POA: Diagnosis not present

## 2015-12-28 DIAGNOSIS — J90 Pleural effusion, not elsewhere classified: Secondary | ICD-10-CM | POA: Diagnosis not present

## 2015-12-28 DIAGNOSIS — M5416 Radiculopathy, lumbar region: Secondary | ICD-10-CM | POA: Insufficient documentation

## 2015-12-28 DIAGNOSIS — Z48812 Encounter for surgical aftercare following surgery on the circulatory system: Secondary | ICD-10-CM | POA: Diagnosis not present

## 2015-12-28 DIAGNOSIS — R739 Hyperglycemia, unspecified: Secondary | ICD-10-CM | POA: Diagnosis not present

## 2015-12-28 DIAGNOSIS — I10 Essential (primary) hypertension: Secondary | ICD-10-CM | POA: Diagnosis not present

## 2015-12-28 DIAGNOSIS — J811 Chronic pulmonary edema: Secondary | ICD-10-CM | POA: Diagnosis not present

## 2015-12-28 DIAGNOSIS — I5022 Chronic systolic (congestive) heart failure: Secondary | ICD-10-CM | POA: Diagnosis not present

## 2015-12-28 DIAGNOSIS — M542 Cervicalgia: Secondary | ICD-10-CM | POA: Insufficient documentation

## 2015-12-28 DIAGNOSIS — J951 Acute pulmonary insufficiency following thoracic surgery: Secondary | ICD-10-CM | POA: Diagnosis not present

## 2015-12-28 DIAGNOSIS — I493 Ventricular premature depolarization: Secondary | ICD-10-CM | POA: Diagnosis not present

## 2015-12-28 DIAGNOSIS — I2699 Other pulmonary embolism without acute cor pulmonale: Secondary | ICD-10-CM | POA: Insufficient documentation

## 2015-12-28 DIAGNOSIS — E119 Type 2 diabetes mellitus without complications: Secondary | ICD-10-CM | POA: Diagnosis not present

## 2015-12-28 DIAGNOSIS — I700 Atherosclerosis of aorta: Secondary | ICD-10-CM | POA: Diagnosis not present

## 2015-12-28 DIAGNOSIS — Z452 Encounter for adjustment and management of vascular access device: Secondary | ICD-10-CM | POA: Diagnosis not present

## 2015-12-28 DIAGNOSIS — I519 Heart disease, unspecified: Secondary | ICD-10-CM | POA: Diagnosis not present

## 2015-12-28 DIAGNOSIS — I4891 Unspecified atrial fibrillation: Secondary | ICD-10-CM | POA: Diagnosis not present

## 2015-12-28 DIAGNOSIS — Z7901 Long term (current) use of anticoagulants: Secondary | ICD-10-CM | POA: Insufficient documentation

## 2015-12-28 DIAGNOSIS — R000 Tachycardia, unspecified: Secondary | ICD-10-CM | POA: Diagnosis not present

## 2015-12-28 DIAGNOSIS — R570 Cardiogenic shock: Secondary | ICD-10-CM | POA: Diagnosis not present

## 2015-12-28 DIAGNOSIS — R0602 Shortness of breath: Secondary | ICD-10-CM | POA: Diagnosis not present

## 2015-12-28 DIAGNOSIS — I9789 Other postprocedural complications and disorders of the circulatory system, not elsewhere classified: Secondary | ICD-10-CM | POA: Diagnosis not present

## 2015-12-28 DIAGNOSIS — I5021 Acute systolic (congestive) heart failure: Secondary | ICD-10-CM | POA: Diagnosis not present

## 2015-12-28 DIAGNOSIS — R0902 Hypoxemia: Secondary | ICD-10-CM | POA: Diagnosis not present

## 2015-12-28 DIAGNOSIS — I252 Old myocardial infarction: Secondary | ICD-10-CM | POA: Diagnosis not present

## 2015-12-28 DIAGNOSIS — I5043 Acute on chronic combined systolic (congestive) and diastolic (congestive) heart failure: Secondary | ICD-10-CM | POA: Diagnosis not present

## 2015-12-28 DIAGNOSIS — R1110 Vomiting, unspecified: Secondary | ICD-10-CM | POA: Diagnosis not present

## 2015-12-28 DIAGNOSIS — Z951 Presence of aortocoronary bypass graft: Secondary | ICD-10-CM | POA: Diagnosis not present

## 2015-12-28 DIAGNOSIS — G20 Parkinson's disease: Secondary | ICD-10-CM | POA: Insufficient documentation

## 2015-12-28 DIAGNOSIS — T82857A Stenosis of cardiac prosthetic devices, implants and grafts, initial encounter: Secondary | ICD-10-CM | POA: Diagnosis not present

## 2015-12-28 DIAGNOSIS — L570 Actinic keratosis: Secondary | ICD-10-CM | POA: Insufficient documentation

## 2015-12-28 DIAGNOSIS — M1990 Unspecified osteoarthritis, unspecified site: Secondary | ICD-10-CM | POA: Insufficient documentation

## 2015-12-28 DIAGNOSIS — R0689 Other abnormalities of breathing: Secondary | ICD-10-CM | POA: Diagnosis not present

## 2015-12-28 DIAGNOSIS — R05 Cough: Secondary | ICD-10-CM | POA: Diagnosis not present

## 2015-12-28 DIAGNOSIS — I517 Cardiomegaly: Secondary | ICD-10-CM | POA: Diagnosis not present

## 2015-12-28 DIAGNOSIS — R0989 Other specified symptoms and signs involving the circulatory and respiratory systems: Secondary | ICD-10-CM | POA: Diagnosis not present

## 2015-12-28 DIAGNOSIS — E875 Hyperkalemia: Secondary | ICD-10-CM | POA: Diagnosis not present

## 2015-12-28 DIAGNOSIS — E1165 Type 2 diabetes mellitus with hyperglycemia: Secondary | ICD-10-CM | POA: Diagnosis not present

## 2015-12-28 DIAGNOSIS — R319 Hematuria, unspecified: Secondary | ICD-10-CM | POA: Diagnosis not present

## 2015-12-28 DIAGNOSIS — Z87891 Personal history of nicotine dependence: Secondary | ICD-10-CM | POA: Diagnosis not present

## 2015-12-28 DIAGNOSIS — Z01818 Encounter for other preprocedural examination: Secondary | ICD-10-CM | POA: Diagnosis not present

## 2015-12-28 DIAGNOSIS — G8912 Acute post-thoracotomy pain: Secondary | ICD-10-CM | POA: Diagnosis not present

## 2015-12-28 DIAGNOSIS — J9811 Atelectasis: Secondary | ICD-10-CM | POA: Diagnosis not present

## 2015-12-28 DIAGNOSIS — R52 Pain, unspecified: Secondary | ICD-10-CM | POA: Diagnosis not present

## 2015-12-28 DIAGNOSIS — G2581 Restless legs syndrome: Secondary | ICD-10-CM | POA: Insufficient documentation

## 2015-12-28 DIAGNOSIS — I5030 Unspecified diastolic (congestive) heart failure: Secondary | ICD-10-CM | POA: Diagnosis not present

## 2015-12-28 DIAGNOSIS — N400 Enlarged prostate without lower urinary tract symptoms: Secondary | ICD-10-CM | POA: Insufficient documentation

## 2015-12-28 HISTORY — DX: Non-ST elevation (NSTEMI) myocardial infarction: I21.4

## 2015-12-29 HISTORY — PX: CARDIAC CATHETERIZATION: SHX172

## 2015-12-30 ENCOUNTER — Ambulatory Visit: Payer: Self-pay | Admitting: Gastroenterology

## 2015-12-31 DIAGNOSIS — I25700 Atherosclerosis of coronary artery bypass graft(s), unspecified, with unstable angina pectoris: Secondary | ICD-10-CM | POA: Insufficient documentation

## 2015-12-31 DIAGNOSIS — I9789 Other postprocedural complications and disorders of the circulatory system, not elsewhere classified: Secondary | ICD-10-CM

## 2015-12-31 DIAGNOSIS — Z951 Presence of aortocoronary bypass graft: Secondary | ICD-10-CM

## 2015-12-31 HISTORY — DX: Other postprocedural complications and disorders of the circulatory system, not elsewhere classified: I97.89

## 2015-12-31 HISTORY — PX: CORONARY ARTERY BYPASS GRAFT: SHX141

## 2015-12-31 HISTORY — DX: Presence of aortocoronary bypass graft: Z95.1

## 2016-01-08 DIAGNOSIS — I2510 Atherosclerotic heart disease of native coronary artery without angina pectoris: Secondary | ICD-10-CM | POA: Diagnosis not present

## 2016-01-08 DIAGNOSIS — I110 Hypertensive heart disease with heart failure: Secondary | ICD-10-CM | POA: Diagnosis not present

## 2016-01-08 DIAGNOSIS — J449 Chronic obstructive pulmonary disease, unspecified: Secondary | ICD-10-CM | POA: Diagnosis not present

## 2016-01-08 DIAGNOSIS — I214 Non-ST elevation (NSTEMI) myocardial infarction: Secondary | ICD-10-CM | POA: Diagnosis not present

## 2016-01-08 DIAGNOSIS — F419 Anxiety disorder, unspecified: Secondary | ICD-10-CM | POA: Diagnosis not present

## 2016-01-08 DIAGNOSIS — M1991 Primary osteoarthritis, unspecified site: Secondary | ICD-10-CM | POA: Diagnosis not present

## 2016-01-08 DIAGNOSIS — E119 Type 2 diabetes mellitus without complications: Secondary | ICD-10-CM | POA: Diagnosis not present

## 2016-01-08 DIAGNOSIS — Z48812 Encounter for surgical aftercare following surgery on the circulatory system: Secondary | ICD-10-CM | POA: Diagnosis not present

## 2016-01-08 DIAGNOSIS — I5042 Chronic combined systolic (congestive) and diastolic (congestive) heart failure: Secondary | ICD-10-CM | POA: Diagnosis not present

## 2016-01-09 DIAGNOSIS — R918 Other nonspecific abnormal finding of lung field: Secondary | ICD-10-CM | POA: Diagnosis not present

## 2016-01-09 DIAGNOSIS — R000 Tachycardia, unspecified: Secondary | ICD-10-CM | POA: Diagnosis not present

## 2016-01-09 DIAGNOSIS — S2232XA Fracture of one rib, left side, initial encounter for closed fracture: Secondary | ICD-10-CM | POA: Diagnosis not present

## 2016-01-09 DIAGNOSIS — I499 Cardiac arrhythmia, unspecified: Secondary | ICD-10-CM

## 2016-01-09 DIAGNOSIS — I4589 Other specified conduction disorders: Secondary | ICD-10-CM | POA: Diagnosis not present

## 2016-01-09 DIAGNOSIS — Z87891 Personal history of nicotine dependence: Secondary | ICD-10-CM | POA: Diagnosis not present

## 2016-01-09 DIAGNOSIS — I25709 Atherosclerosis of coronary artery bypass graft(s), unspecified, with unspecified angina pectoris: Secondary | ICD-10-CM | POA: Diagnosis not present

## 2016-01-09 DIAGNOSIS — R0602 Shortness of breath: Secondary | ICD-10-CM | POA: Diagnosis not present

## 2016-01-09 DIAGNOSIS — Z48812 Encounter for surgical aftercare following surgery on the circulatory system: Secondary | ICD-10-CM | POA: Diagnosis not present

## 2016-01-09 DIAGNOSIS — J9811 Atelectasis: Secondary | ICD-10-CM | POA: Diagnosis not present

## 2016-01-09 DIAGNOSIS — R9431 Abnormal electrocardiogram [ECG] [EKG]: Secondary | ICD-10-CM | POA: Diagnosis not present

## 2016-01-09 DIAGNOSIS — I313 Pericardial effusion (noninflammatory): Secondary | ICD-10-CM | POA: Diagnosis not present

## 2016-01-09 DIAGNOSIS — J90 Pleural effusion, not elsewhere classified: Secondary | ICD-10-CM | POA: Diagnosis not present

## 2016-01-09 DIAGNOSIS — R42 Dizziness and giddiness: Secondary | ICD-10-CM | POA: Diagnosis not present

## 2016-01-09 DIAGNOSIS — I25700 Atherosclerosis of coronary artery bypass graft(s), unspecified, with unstable angina pectoris: Secondary | ICD-10-CM | POA: Diagnosis not present

## 2016-01-09 HISTORY — DX: Cardiac arrhythmia, unspecified: I49.9

## 2016-01-10 DIAGNOSIS — I950 Idiopathic hypotension: Secondary | ICD-10-CM | POA: Diagnosis not present

## 2016-01-10 DIAGNOSIS — I2510 Atherosclerotic heart disease of native coronary artery without angina pectoris: Secondary | ICD-10-CM | POA: Diagnosis not present

## 2016-01-10 DIAGNOSIS — Z951 Presence of aortocoronary bypass graft: Secondary | ICD-10-CM | POA: Diagnosis not present

## 2016-01-10 DIAGNOSIS — I471 Supraventricular tachycardia: Secondary | ICD-10-CM | POA: Diagnosis not present

## 2016-01-10 DIAGNOSIS — R9431 Abnormal electrocardiogram [ECG] [EKG]: Secondary | ICD-10-CM | POA: Diagnosis not present

## 2016-01-10 DIAGNOSIS — K5900 Constipation, unspecified: Secondary | ICD-10-CM | POA: Diagnosis not present

## 2016-01-10 DIAGNOSIS — Z86711 Personal history of pulmonary embolism: Secondary | ICD-10-CM | POA: Diagnosis not present

## 2016-01-10 DIAGNOSIS — N179 Acute kidney failure, unspecified: Secondary | ICD-10-CM | POA: Diagnosis not present

## 2016-01-11 DIAGNOSIS — I2510 Atherosclerotic heart disease of native coronary artery without angina pectoris: Secondary | ICD-10-CM | POA: Diagnosis not present

## 2016-01-11 DIAGNOSIS — Z951 Presence of aortocoronary bypass graft: Secondary | ICD-10-CM | POA: Diagnosis not present

## 2016-01-11 DIAGNOSIS — I471 Supraventricular tachycardia: Secondary | ICD-10-CM | POA: Diagnosis not present

## 2016-01-11 DIAGNOSIS — Z86711 Personal history of pulmonary embolism: Secondary | ICD-10-CM | POA: Diagnosis not present

## 2016-01-11 DIAGNOSIS — N179 Acute kidney failure, unspecified: Secondary | ICD-10-CM | POA: Diagnosis not present

## 2016-01-13 DIAGNOSIS — J449 Chronic obstructive pulmonary disease, unspecified: Secondary | ICD-10-CM | POA: Diagnosis not present

## 2016-01-13 DIAGNOSIS — Z48812 Encounter for surgical aftercare following surgery on the circulatory system: Secondary | ICD-10-CM | POA: Diagnosis not present

## 2016-01-13 DIAGNOSIS — I2510 Atherosclerotic heart disease of native coronary artery without angina pectoris: Secondary | ICD-10-CM | POA: Diagnosis not present

## 2016-01-13 DIAGNOSIS — I110 Hypertensive heart disease with heart failure: Secondary | ICD-10-CM | POA: Diagnosis not present

## 2016-01-13 DIAGNOSIS — E119 Type 2 diabetes mellitus without complications: Secondary | ICD-10-CM | POA: Diagnosis not present

## 2016-01-13 DIAGNOSIS — M1991 Primary osteoarthritis, unspecified site: Secondary | ICD-10-CM | POA: Diagnosis not present

## 2016-01-13 DIAGNOSIS — I214 Non-ST elevation (NSTEMI) myocardial infarction: Secondary | ICD-10-CM | POA: Diagnosis not present

## 2016-01-13 DIAGNOSIS — I5042 Chronic combined systolic (congestive) and diastolic (congestive) heart failure: Secondary | ICD-10-CM | POA: Diagnosis not present

## 2016-01-13 DIAGNOSIS — F419 Anxiety disorder, unspecified: Secondary | ICD-10-CM | POA: Diagnosis not present

## 2016-01-15 DIAGNOSIS — I959 Hypotension, unspecified: Secondary | ICD-10-CM | POA: Diagnosis not present

## 2016-01-15 DIAGNOSIS — I471 Supraventricular tachycardia: Secondary | ICD-10-CM | POA: Diagnosis not present

## 2016-01-15 DIAGNOSIS — J9811 Atelectasis: Secondary | ICD-10-CM | POA: Diagnosis not present

## 2016-01-15 DIAGNOSIS — Z7982 Long term (current) use of aspirin: Secondary | ICD-10-CM | POA: Diagnosis not present

## 2016-01-15 DIAGNOSIS — Z951 Presence of aortocoronary bypass graft: Secondary | ICD-10-CM | POA: Diagnosis not present

## 2016-01-15 DIAGNOSIS — R531 Weakness: Secondary | ICD-10-CM | POA: Diagnosis not present

## 2016-01-15 DIAGNOSIS — Z48812 Encounter for surgical aftercare following surgery on the circulatory system: Secondary | ICD-10-CM | POA: Diagnosis not present

## 2016-01-15 DIAGNOSIS — J90 Pleural effusion, not elsewhere classified: Secondary | ICD-10-CM | POA: Diagnosis not present

## 2016-01-15 DIAGNOSIS — Z7901 Long term (current) use of anticoagulants: Secondary | ICD-10-CM | POA: Diagnosis not present

## 2016-01-15 DIAGNOSIS — E876 Hypokalemia: Secondary | ICD-10-CM | POA: Diagnosis not present

## 2016-01-16 DIAGNOSIS — I2510 Atherosclerotic heart disease of native coronary artery without angina pectoris: Secondary | ICD-10-CM | POA: Diagnosis not present

## 2016-01-16 DIAGNOSIS — Z48812 Encounter for surgical aftercare following surgery on the circulatory system: Secondary | ICD-10-CM | POA: Diagnosis not present

## 2016-01-16 DIAGNOSIS — I5042 Chronic combined systolic (congestive) and diastolic (congestive) heart failure: Secondary | ICD-10-CM | POA: Diagnosis not present

## 2016-01-16 DIAGNOSIS — F419 Anxiety disorder, unspecified: Secondary | ICD-10-CM | POA: Diagnosis not present

## 2016-01-16 DIAGNOSIS — I480 Paroxysmal atrial fibrillation: Secondary | ICD-10-CM | POA: Diagnosis not present

## 2016-01-16 DIAGNOSIS — E119 Type 2 diabetes mellitus without complications: Secondary | ICD-10-CM | POA: Diagnosis not present

## 2016-01-16 DIAGNOSIS — I110 Hypertensive heart disease with heart failure: Secondary | ICD-10-CM | POA: Diagnosis not present

## 2016-01-16 DIAGNOSIS — M1991 Primary osteoarthritis, unspecified site: Secondary | ICD-10-CM | POA: Diagnosis not present

## 2016-01-16 DIAGNOSIS — I214 Non-ST elevation (NSTEMI) myocardial infarction: Secondary | ICD-10-CM | POA: Diagnosis not present

## 2016-01-16 DIAGNOSIS — J449 Chronic obstructive pulmonary disease, unspecified: Secondary | ICD-10-CM | POA: Diagnosis not present

## 2016-01-16 DIAGNOSIS — Z951 Presence of aortocoronary bypass graft: Secondary | ICD-10-CM | POA: Diagnosis not present

## 2016-01-17 DIAGNOSIS — Z48812 Encounter for surgical aftercare following surgery on the circulatory system: Secondary | ICD-10-CM | POA: Diagnosis not present

## 2016-01-17 DIAGNOSIS — F419 Anxiety disorder, unspecified: Secondary | ICD-10-CM | POA: Diagnosis not present

## 2016-01-17 DIAGNOSIS — I2510 Atherosclerotic heart disease of native coronary artery without angina pectoris: Secondary | ICD-10-CM | POA: Diagnosis not present

## 2016-01-17 DIAGNOSIS — M1991 Primary osteoarthritis, unspecified site: Secondary | ICD-10-CM | POA: Diagnosis not present

## 2016-01-17 DIAGNOSIS — I214 Non-ST elevation (NSTEMI) myocardial infarction: Secondary | ICD-10-CM | POA: Diagnosis not present

## 2016-01-17 DIAGNOSIS — I110 Hypertensive heart disease with heart failure: Secondary | ICD-10-CM | POA: Diagnosis not present

## 2016-01-17 DIAGNOSIS — J449 Chronic obstructive pulmonary disease, unspecified: Secondary | ICD-10-CM | POA: Diagnosis not present

## 2016-01-17 DIAGNOSIS — E119 Type 2 diabetes mellitus without complications: Secondary | ICD-10-CM | POA: Diagnosis not present

## 2016-01-17 DIAGNOSIS — I5042 Chronic combined systolic (congestive) and diastolic (congestive) heart failure: Secondary | ICD-10-CM | POA: Diagnosis not present

## 2016-01-20 DIAGNOSIS — I509 Heart failure, unspecified: Secondary | ICD-10-CM | POA: Diagnosis not present

## 2016-01-20 DIAGNOSIS — I2510 Atherosclerotic heart disease of native coronary artery without angina pectoris: Secondary | ICD-10-CM | POA: Diagnosis not present

## 2016-01-20 DIAGNOSIS — J449 Chronic obstructive pulmonary disease, unspecified: Secondary | ICD-10-CM | POA: Diagnosis not present

## 2016-01-20 DIAGNOSIS — R072 Precordial pain: Secondary | ICD-10-CM | POA: Diagnosis not present

## 2016-01-20 DIAGNOSIS — R55 Syncope and collapse: Secondary | ICD-10-CM | POA: Diagnosis not present

## 2016-01-20 DIAGNOSIS — R079 Chest pain, unspecified: Secondary | ICD-10-CM | POA: Diagnosis not present

## 2016-01-20 DIAGNOSIS — J90 Pleural effusion, not elsewhere classified: Secondary | ICD-10-CM | POA: Diagnosis not present

## 2016-01-20 DIAGNOSIS — I959 Hypotension, unspecified: Secondary | ICD-10-CM | POA: Diagnosis not present

## 2016-01-20 DIAGNOSIS — I110 Hypertensive heart disease with heart failure: Secondary | ICD-10-CM | POA: Diagnosis not present

## 2016-01-20 DIAGNOSIS — R0602 Shortness of breath: Secondary | ICD-10-CM | POA: Diagnosis not present

## 2016-01-20 DIAGNOSIS — E785 Hyperlipidemia, unspecified: Secondary | ICD-10-CM | POA: Diagnosis not present

## 2016-01-20 DIAGNOSIS — Z951 Presence of aortocoronary bypass graft: Secondary | ICD-10-CM | POA: Diagnosis not present

## 2016-01-20 DIAGNOSIS — R918 Other nonspecific abnormal finding of lung field: Secondary | ICD-10-CM | POA: Diagnosis not present

## 2016-01-20 DIAGNOSIS — I252 Old myocardial infarction: Secondary | ICD-10-CM | POA: Diagnosis not present

## 2016-01-20 DIAGNOSIS — I471 Supraventricular tachycardia: Secondary | ICD-10-CM | POA: Diagnosis not present

## 2016-01-21 DIAGNOSIS — I2510 Atherosclerotic heart disease of native coronary artery without angina pectoris: Secondary | ICD-10-CM | POA: Diagnosis not present

## 2016-01-21 DIAGNOSIS — N179 Acute kidney failure, unspecified: Secondary | ICD-10-CM | POA: Diagnosis not present

## 2016-01-21 DIAGNOSIS — R55 Syncope and collapse: Secondary | ICD-10-CM | POA: Diagnosis not present

## 2016-01-21 DIAGNOSIS — I517 Cardiomegaly: Secondary | ICD-10-CM | POA: Diagnosis not present

## 2016-01-21 DIAGNOSIS — I519 Heart disease, unspecified: Secondary | ICD-10-CM | POA: Diagnosis not present

## 2016-01-21 DIAGNOSIS — I10 Essential (primary) hypertension: Secondary | ICD-10-CM | POA: Diagnosis not present

## 2016-01-23 DIAGNOSIS — I25700 Atherosclerosis of coronary artery bypass graft(s), unspecified, with unstable angina pectoris: Secondary | ICD-10-CM | POA: Diagnosis not present

## 2016-01-30 DIAGNOSIS — R918 Other nonspecific abnormal finding of lung field: Secondary | ICD-10-CM | POA: Diagnosis not present

## 2016-01-30 DIAGNOSIS — R0602 Shortness of breath: Secondary | ICD-10-CM | POA: Diagnosis not present

## 2016-01-30 DIAGNOSIS — I2699 Other pulmonary embolism without acute cor pulmonale: Secondary | ICD-10-CM | POA: Diagnosis not present

## 2016-01-30 DIAGNOSIS — G2581 Restless legs syndrome: Secondary | ICD-10-CM | POA: Diagnosis not present

## 2016-01-30 DIAGNOSIS — I25810 Atherosclerosis of coronary artery bypass graft(s) without angina pectoris: Secondary | ICD-10-CM | POA: Diagnosis not present

## 2016-01-30 DIAGNOSIS — I509 Heart failure, unspecified: Secondary | ICD-10-CM | POA: Diagnosis not present

## 2016-01-30 DIAGNOSIS — I2510 Atherosclerotic heart disease of native coronary artery without angina pectoris: Secondary | ICD-10-CM | POA: Diagnosis not present

## 2016-01-30 DIAGNOSIS — I110 Hypertensive heart disease with heart failure: Secondary | ICD-10-CM | POA: Diagnosis not present

## 2016-01-30 DIAGNOSIS — Z7982 Long term (current) use of aspirin: Secondary | ICD-10-CM | POA: Diagnosis not present

## 2016-01-30 DIAGNOSIS — I4581 Long QT syndrome: Secondary | ICD-10-CM | POA: Diagnosis not present

## 2016-01-30 DIAGNOSIS — I471 Supraventricular tachycardia: Secondary | ICD-10-CM | POA: Diagnosis not present

## 2016-01-30 DIAGNOSIS — F419 Anxiety disorder, unspecified: Secondary | ICD-10-CM | POA: Diagnosis not present

## 2016-01-30 DIAGNOSIS — J449 Chronic obstructive pulmonary disease, unspecified: Secondary | ICD-10-CM | POA: Insufficient documentation

## 2016-01-30 DIAGNOSIS — N400 Enlarged prostate without lower urinary tract symptoms: Secondary | ICD-10-CM | POA: Diagnosis not present

## 2016-01-30 DIAGNOSIS — J90 Pleural effusion, not elsewhere classified: Secondary | ICD-10-CM | POA: Diagnosis not present

## 2016-01-30 DIAGNOSIS — J441 Chronic obstructive pulmonary disease with (acute) exacerbation: Secondary | ICD-10-CM | POA: Diagnosis not present

## 2016-01-30 DIAGNOSIS — R079 Chest pain, unspecified: Secondary | ICD-10-CM | POA: Diagnosis not present

## 2016-01-30 DIAGNOSIS — I10 Essential (primary) hypertension: Secondary | ICD-10-CM | POA: Diagnosis not present

## 2016-01-30 DIAGNOSIS — E119 Type 2 diabetes mellitus without complications: Secondary | ICD-10-CM | POA: Diagnosis not present

## 2016-01-30 DIAGNOSIS — R05 Cough: Secondary | ICD-10-CM | POA: Diagnosis not present

## 2016-01-30 DIAGNOSIS — J029 Acute pharyngitis, unspecified: Secondary | ICD-10-CM | POA: Diagnosis not present

## 2016-01-30 DIAGNOSIS — N401 Enlarged prostate with lower urinary tract symptoms: Secondary | ICD-10-CM | POA: Diagnosis not present

## 2016-01-30 DIAGNOSIS — Z8679 Personal history of other diseases of the circulatory system: Secondary | ICD-10-CM | POA: Diagnosis not present

## 2016-01-30 DIAGNOSIS — J9811 Atelectasis: Secondary | ICD-10-CM | POA: Diagnosis not present

## 2016-01-31 DIAGNOSIS — F419 Anxiety disorder, unspecified: Secondary | ICD-10-CM | POA: Diagnosis not present

## 2016-01-31 DIAGNOSIS — I25810 Atherosclerosis of coronary artery bypass graft(s) without angina pectoris: Secondary | ICD-10-CM | POA: Diagnosis not present

## 2016-01-31 DIAGNOSIS — N401 Enlarged prostate with lower urinary tract symptoms: Secondary | ICD-10-CM | POA: Diagnosis not present

## 2016-01-31 DIAGNOSIS — E119 Type 2 diabetes mellitus without complications: Secondary | ICD-10-CM | POA: Diagnosis not present

## 2016-01-31 DIAGNOSIS — I10 Essential (primary) hypertension: Secondary | ICD-10-CM | POA: Diagnosis not present

## 2016-01-31 DIAGNOSIS — J441 Chronic obstructive pulmonary disease with (acute) exacerbation: Secondary | ICD-10-CM | POA: Diagnosis not present

## 2016-01-31 DIAGNOSIS — G2581 Restless legs syndrome: Secondary | ICD-10-CM | POA: Diagnosis not present

## 2016-01-31 DIAGNOSIS — I2699 Other pulmonary embolism without acute cor pulmonale: Secondary | ICD-10-CM | POA: Diagnosis not present

## 2016-01-31 DIAGNOSIS — Z8679 Personal history of other diseases of the circulatory system: Secondary | ICD-10-CM | POA: Diagnosis not present

## 2016-02-01 DIAGNOSIS — I25810 Atherosclerosis of coronary artery bypass graft(s) without angina pectoris: Secondary | ICD-10-CM | POA: Diagnosis not present

## 2016-02-01 DIAGNOSIS — F419 Anxiety disorder, unspecified: Secondary | ICD-10-CM | POA: Diagnosis not present

## 2016-02-01 DIAGNOSIS — I10 Essential (primary) hypertension: Secondary | ICD-10-CM | POA: Diagnosis not present

## 2016-02-01 DIAGNOSIS — N401 Enlarged prostate with lower urinary tract symptoms: Secondary | ICD-10-CM | POA: Diagnosis not present

## 2016-02-01 DIAGNOSIS — E119 Type 2 diabetes mellitus without complications: Secondary | ICD-10-CM | POA: Diagnosis not present

## 2016-02-01 DIAGNOSIS — J441 Chronic obstructive pulmonary disease with (acute) exacerbation: Secondary | ICD-10-CM | POA: Diagnosis not present

## 2016-02-01 DIAGNOSIS — I509 Heart failure, unspecified: Secondary | ICD-10-CM | POA: Diagnosis not present

## 2016-02-01 DIAGNOSIS — I2699 Other pulmonary embolism without acute cor pulmonale: Secondary | ICD-10-CM | POA: Diagnosis not present

## 2016-04-02 ENCOUNTER — Emergency Department: Payer: Medicare HMO

## 2016-04-02 ENCOUNTER — Encounter: Payer: Self-pay | Admitting: *Deleted

## 2016-04-02 ENCOUNTER — Emergency Department
Admission: EM | Admit: 2016-04-02 | Discharge: 2016-04-02 | Disposition: A | Discharge status: Home / Self Care | Payer: Medicare HMO | Attending: Emergency Medicine | Admitting: Emergency Medicine

## 2016-04-02 DIAGNOSIS — Z7982 Long term (current) use of aspirin: Secondary | ICD-10-CM | POA: Insufficient documentation

## 2016-04-02 DIAGNOSIS — Z79899 Other long term (current) drug therapy: Secondary | ICD-10-CM | POA: Diagnosis not present

## 2016-04-02 DIAGNOSIS — J449 Chronic obstructive pulmonary disease, unspecified: Secondary | ICD-10-CM | POA: Diagnosis not present

## 2016-04-02 DIAGNOSIS — G20 Parkinson's disease: Secondary | ICD-10-CM | POA: Insufficient documentation

## 2016-04-02 DIAGNOSIS — Y9301 Activity, walking, marching and hiking: Secondary | ICD-10-CM | POA: Insufficient documentation

## 2016-04-02 DIAGNOSIS — Y92512 Supermarket, store or market as the place of occurrence of the external cause: Secondary | ICD-10-CM | POA: Diagnosis not present

## 2016-04-02 DIAGNOSIS — S0990XA Unspecified injury of head, initial encounter: Secondary | ICD-10-CM

## 2016-04-02 DIAGNOSIS — S60211A Contusion of right wrist, initial encounter: Secondary | ICD-10-CM | POA: Insufficient documentation

## 2016-04-02 DIAGNOSIS — E119 Type 2 diabetes mellitus without complications: Secondary | ICD-10-CM | POA: Diagnosis not present

## 2016-04-02 DIAGNOSIS — W19XXXA Unspecified fall, initial encounter: Secondary | ICD-10-CM

## 2016-04-02 DIAGNOSIS — I10 Essential (primary) hypertension: Secondary | ICD-10-CM | POA: Insufficient documentation

## 2016-04-02 DIAGNOSIS — Z7984 Long term (current) use of oral hypoglycemic drugs: Secondary | ICD-10-CM | POA: Insufficient documentation

## 2016-04-02 DIAGNOSIS — W01198A Fall on same level from slipping, tripping and stumbling with subsequent striking against other object, initial encounter: Secondary | ICD-10-CM | POA: Diagnosis not present

## 2016-04-02 DIAGNOSIS — S022XXA Fracture of nasal bones, initial encounter for closed fracture: Secondary | ICD-10-CM | POA: Insufficient documentation

## 2016-04-02 DIAGNOSIS — Y999 Unspecified external cause status: Secondary | ICD-10-CM | POA: Insufficient documentation

## 2016-04-02 DIAGNOSIS — I2510 Atherosclerotic heart disease of native coronary artery without angina pectoris: Secondary | ICD-10-CM | POA: Diagnosis not present

## 2016-04-02 DIAGNOSIS — S0993XA Unspecified injury of face, initial encounter: Secondary | ICD-10-CM | POA: Diagnosis not present

## 2016-04-02 HISTORY — DX: Type 2 diabetes mellitus without complications: E11.9

## 2016-04-02 HISTORY — DX: Essential (primary) hypertension: I10

## 2016-04-02 HISTORY — DX: Chronic obstructive pulmonary disease, unspecified: J44.9

## 2016-04-02 LAB — BASIC METABOLIC PANEL
ANION GAP: 7 (ref 5–15)
BUN: 24 mg/dL — ABNORMAL HIGH (ref 6–20)
CALCIUM: 9.1 mg/dL (ref 8.9–10.3)
CHLORIDE: 104 mmol/L (ref 101–111)
CO2: 23 mmol/L (ref 22–32)
Creatinine, Ser: 0.96 mg/dL (ref 0.61–1.24)
GFR calc non Af Amer: >60 mL/min (ref >60)
Glucose, Bld: 158 mg/dL — ABNORMAL HIGH (ref 65–99)
POTASSIUM: 3.6 mmol/L (ref 3.5–5.1)
Sodium: 134 mmol/L — ABNORMAL LOW (ref 135–145)

## 2016-04-02 LAB — PROTIME-INR
INR: 1.09
Prothrombin Time: 14.1 seconds (ref 11.4–15.2)

## 2016-04-02 LAB — CBC
HEMATOCRIT: 37.5 % — AB (ref 40.0–52.0)
HEMOGLOBIN: 12.3 g/dL — AB (ref 13.0–18.0)
MCH: 27.4 pg (ref 26.0–34.0)
MCHC: 32.8 g/dL (ref 32.0–36.0)
MCV: 83.5 fL (ref 80.0–100.0)
Platelets: 354 10*3/uL (ref 150–440)
RBC: 4.49 MIL/uL (ref 4.40–5.90)
RDW: 18.6 % — ABNORMAL HIGH (ref 11.5–14.5)
WBC: 7.5 10*3/uL (ref 3.8–10.6)

## 2016-04-02 LAB — APTT: APTT: 39 s — AB (ref 24–36)

## 2016-04-02 MED ORDER — MORPHINE SULFATE (PF) 2 MG/ML IV SOLN: 2.0000 mg | Freq: Once | INTRAVENOUS | Status: DC

## 2016-04-02 MED ORDER — ONDANSETRON HCL 4 MG/2ML IJ SOLN
4.0000 mg | Freq: Once | INTRAMUSCULAR | Status: AC
  Administered 2016-04-02: 4 mg via INTRAVENOUS
  Filled 2016-04-02: qty 2

## 2016-04-02 MED ORDER — HYDROCODONE-ACETAMINOPHEN 5-325 MG PO TABS
1.0000 | ORAL_TABLET | Freq: Once | ORAL | Status: AC
  Administered 2016-04-02: 1 via ORAL
  Filled 2016-04-02: qty 1

## 2016-04-02 MED ORDER — MORPHINE SULFATE (PF) 4 MG/ML IV SOLN
4.0000 mg | Freq: Once | INTRAVENOUS | Status: AC
  Administered 2016-04-02: 4 mg via INTRAVENOUS
  Filled 2016-04-02: qty 1

## 2016-04-02 MED ORDER — CEPHALEXIN 500 MG PO CAPS: 500.0000 mg | ORAL_CAPSULE | Freq: Four times a day (QID) | ORAL | 0 refills | Status: DC

## 2016-04-02 NOTE — ED Triage Notes (Signed)
Pt states his right foot got caught on a curb and he fell face first, arrives with nose bleed and hematoma to forehead, states he is on eliquis, awake and alert in no acute distress, denies any LOC

## 2016-04-02 NOTE — Discharge Instructions (Signed)
You were seen in the Emergency Department (ED) today for a head injury.  If you had a CT scan done, it did not show any evidence of serious injury or bleeding.    Signs of a more serious head injury include vomiting, severe headache, excessive sleepiness or confusion, and weakness or numbness in your face, arms or legs.  Return immediately to the Emergency Department if you experience any of these more concerning symptoms.

## 2016-04-02 NOTE — ED Provider Notes (Signed)
[facility] Emergency Department Provider Note   ____________________________________________   First MD Initiated Contact with Patient 04/02/16 1526     (approximate)  I have reviewed the triage vital signs and the nursing notes.   HISTORY  Chief Complaint Fall and Epistaxis    HPI Scott BOLEY Sr. is a 71 y.o. male here for evaluation after a fall  Patient was walking into a store, tripped going over the curb falling forward grabbing himself with his hands breaking his fall, but striking his face on the pavement. There was no loss of consciousness. He reports significant pain around his nose and over a area of a "goose egg" just above the right eye. Denies any visual changes. No chest pain or trouble breathing. He is on anticoagulant 22 pulmonary embolisms.  He did not initially notice it, but now feels like his right wrist is slightly sore. Denies any neck pain numbness tingling or weakness.  Reports moderate to severe pain over the nasal bridge. Reports as a throbbing discomfort. Reports he has taken morphine and hydrocodone in the past without problems.   Past Medical History:  Diagnosis Date  . COPD (chronic obstructive pulmonary disease) (HCC)   . Diabetes mellitus without complication (HCC)   . Hypertension     Patient Active Problem List   Diagnosis Date Noted  . Actinic keratosis 12/28/2015  . Anxiety 12/28/2015  . Benign essential hypertension 12/28/2015  . Benign prostatic hyperplasia 12/28/2015  . Coronary artery disease 12/28/2015  . Diabetes mellitus (HCC) 12/28/2015  . History of renal stone 12/28/2015  . Hyperlipidemia 12/28/2015  . Insomnia 12/28/2015  . Long term current use of anticoagulant 12/28/2015  . Lumbar radiculopathy 12/28/2015  . Neck pain 12/28/2015  . Osteoarthritis 12/28/2015  . Parkinsonism (HCC) 12/28/2015  . Pulmonary embolism (HCC) 12/28/2015  . Restless legs syndrome 12/28/2015  . Seborrheic  dermatitis 12/28/2015  . Vertigo of central origin 12/28/2015    Past Surgical History:  Procedure Laterality Date  . CORONARY ARTERY BYPASS GRAFT    . CORONARY STENT PLACEMENT      Prior to Admission medications   Medication Sig Start Date End Date Taking? Authorizing Provider  acetaminophen (TYLENOL) 500 MG tablet Take 1,000 mg by mouth 3 (three) times daily.    Yes Historical Provider, MD  albuterol (PROVENTIL HFA;VENTOLIN HFA) 108 (90 Base) MCG/ACT inhaler Inhale 2 puffs into the lungs every 6 (six) hours as needed for wheezing.   Yes Historical Provider, MD  amiodarone (PACERONE) 200 MG tablet Take 200 mg by mouth daily. 01/21/16  Yes Historical Provider, MD  apixaban (ELIQUIS) 2.5 MG TABS tablet Take 2.5 mg by mouth 2 (two) times daily.   Yes Historical Provider, MD  aspirin EC 81 MG tablet Take 81 mg by mouth.   Yes Historical Provider, MD  atorvastatin (LIPITOR) 80 MG tablet Take 80 mg by mouth daily.   Yes Historical Provider, MD  Calcium Carbonate-Vitamin D 600-400 MG-UNIT tablet Take 1 tablet by mouth 2 (two) times daily with a meal.   Yes Historical Provider, MD  cetirizine (ZYRTEC) 10 MG tablet Take 10 mg by mouth daily.   Yes Historical Provider, MD  FLUoxetine (PROZAC) 20 MG tablet Take 80 mg by mouth daily.   Yes Historical Provider, MD  gabapentin (NEURONTIN) 300 MG capsule Take 600 mg by mouth 3 (three) times daily.   Yes Historical Provider, MD  glucosamine-chondroitin 500-400 MG tablet Take 1 tablet by mouth 2 (two) times daily.  Yes Historical Provider, MD  hydrocortisone 2.5 % cream Apply 1 application topically 2 (two) times daily.   Yes Historical Provider, MD  lisinopril (PRINIVIL,ZESTRIL) 2.5 MG tablet Take 2.5 mg by mouth daily. 01/06/16  Yes Historical Provider, MD  magnesium oxide (MAG-OX) 400 MG tablet Take 400 mg by mouth 2 (two) times daily.   Yes Historical Provider, MD  metFORMIN (GLUCOPHAGE) 500 MG tablet Take 500 mg by mouth 2 (two) times daily with a  meal.   Yes Historical Provider, MD  metoprolol succinate (TOPROL-XL) 25 MG 24 hr tablet Take 12.5 mg by mouth daily.  01/12/16  Yes Historical Provider, MD  mirtazapine (REMERON) 15 MG tablet Take 45 mg by mouth at bedtime.   Yes Historical Provider, MD  pramipexole (MIRAPEX) 1 MG tablet Take 0.5 mg by mouth every evening.   Yes Historical Provider, MD  prochlorperazine (COMPAZINE) 5 MG tablet Take 10 mg by mouth every 8 (eight) hours as needed for nausea.   Yes Historical Provider, MD  ranitidine (ZANTAC) 150 MG tablet Take 150 mg by mouth 2 (two) times daily.   Yes Historical Provider, MD  cephALEXin (KEFLEX) 500 MG capsule Take 1 capsule (500 mg total) by mouth 4 (four) times daily. 04/02/16   Sharyn Creamer, MD    Allergies Prednisone and Nitroglycerin  History reviewed. No pertinent family history.  Social History Social History  Substance Use Topics  . Smoking status: Never Smoker  . Smokeless tobacco: Never Used  . Alcohol use No    Review of Systems Constitutional: No fever/chills Eyes: No visual changes. ENT: No sore throat. Cardiovascular: Denies chest pain. Respiratory: Denies shortness of breath. Gastrointestinal: No abdominal pain.  No nausea, no vomiting.  No diarrhea.  No constipation. Genitourinary: Negative for dysuria. Musculoskeletal: Negative for back pain.No neck pain. No pain in his arms or legs except for discomfort over the right wrist. Skin: Negative for rash. Neurological: Negative for headaches, focal weakness or numbness.  10-point ROS otherwise negative.  ____________________________________________   PHYSICAL EXAM:  VITAL SIGNS: ED Triage Vitals [04/02/16 1449]  Enc Vitals Group     BP (!) 138/97     Pulse Rate 82     Resp 18     Temp 98.2 F (36.8 C)     Temp Source Oral     SpO2 97 %     Weight 254 lb (115.2 kg)     Height 6\' 1"  (1.854 m)     Head Circumference      Peak Flow      Pain Score 9     Pain Loc      Pain Edu?      Excl.  in GC?    Constitutional: Alert and oriented. Well appearing and in no acute distress. Eyes: Conjunctivae are normal. PERRL. EOMI. Head: Atraumatic. Nose: No congestion/rhinnorhea. Mouth/Throat: Mucous membranes are moist.  Oropharynx non-erythematous. Neck: No stridor.  No cervical spine tenderness. Full range of motion of the neck without pain or discomfort. No tenderness of thoracic or lumbar spine. Cardiovascular: Normal rate, regular rhythm. Grossly normal heart sounds.  Good peripheral circulation. Respiratory: Normal respiratory effort.  No retractions. Lungs CTAB. Gastrointestinal: Soft and nontender.  Musculoskeletal:   RIGHT Right upper extremity demonstrates normal strength, good use of all muscles. No edema bruising or contusions of the right shoulder/upper arm, right elbow, right forearm / hand. The right wrist is mildly tender over the ventral portion of the wrist without obvious deformity or bruising noted. Full  range of motion of the right right upper extremity without pain. No evidence of trauma. Strong radial pulse. Intact median/ulnar/radial neuro-muscular exam.  LEFT Left upper extremity demonstrates normal strength, good use of all muscles. No edema bruising or contusions of the left shoulder/upper arm, left elbow, left forearm / hand. Full range of motion of the left  upper extremity without pain. No evidence of trauma. Strong radial pulse. Intact median/ulnar/radial neuro-muscular exam.  Lower Extremities  No edema. Normal DP/PT pulses bilateral with good cap refill.  Normal neuro-motor function lower extremities bilateral.  RIGHT Right lower extremity demonstrates normal strength, good use of all muscles. No edema bruising or contusions of the right hip, right knee, right ankle set for a small abrasion over the left anterior knee without deformity and he is able to range it well without. Full range of motion of the right lower extremity without pain. No pain on axial  loading. No evidence of trauma.  LEFT Left lower extremity demonstrates normal strength, good use of all muscles. No edema bruising or contusions of the hip,  knee, ankle. Full range of motion of the left lower extremity without pain. No pain on axial loading. No evidence of trauma.   Neurologic:  Normal speech and language. No gross focal neurologic deficits are appreciated. No gait instability. Skin:  Skin is warm, dry and intact. No rash noted. Psychiatric: Mood and affect are normal. Speech and behavior are normal.  ____________________________________________   LABS (all labs ordered are listed, but only abnormal results are displayed)  Labs Reviewed  CBC - Abnormal; Notable for the following:       Result Value   Hemoglobin 12.3 (*)    HCT 37.5 (*)    RDW 18.6 (*)    All other components within normal limits  BASIC METABOLIC PANEL - Abnormal; Notable for the following:    Sodium 134 (*)    Glucose, Bld 158 (*)    BUN 24 (*)    All other components within normal limits  APTT - Abnormal; Notable for the following:    aPTT 39 (*)    All other components within normal limits  PROTIME-INR   ____________________________________________  EKG   ____________________________________________  RADIOLOGY  Dg Wrist Complete Right  Result Date: 04/02/2016 CLINICAL DATA:  Pt states his right foot got caught on a curb and he fell face first this afternoon. Pain in right wrist, worse with supination. IV catheter is placed in right hand. No prior injury or surgery. EXAM: RIGHT WRIST - COMPLETE 3+ VIEW COMPARISON:  None. FINDINGS: No fracture or dislocation. There is joint space narrowing with mild subchondral sclerosis and marginal osteophytes at the trapezium first metacarpal articulation, as well as at scaphoid trapezium articulation. Remaining joints are normally spaced and aligned. Bones are demineralized. Soft tissues are unremarkable. IMPRESSION: 1. No fracture, dislocation or  acute finding. 2. Osteoarthritis involving the first carpometacarpal articulation and the scaphoid trapezium articulation. Electronically Signed   By: Amie Portlandavid  Ormond M.D.   On: 04/02/2016 16:36   Ct Head Wo Contrast  Result Date: 04/02/2016 CLINICAL DATA:  Recent fall with forehead hematoma EXAM: CT HEAD WITHOUT CONTRAST TECHNIQUE: Contiguous axial images were obtained from the base of the skull through the vertex without intravenous contrast. COMPARISON:  None. FINDINGS: Brain: No evidence of acute infarction, hemorrhage, hydrocephalus, extra-axial collection or mass lesion/mass effect.Mild atrophic changes are noted. Vascular: No hyperdense vessel or unexpected calcification. Skull: Normal. Negative for fracture or focal lesion. Sinuses/Orbits: Bilateral air-fluid levels  are noted within the maxillary antra as well as mucosal retention cysts consistent with the recent injury. Air-fluid level is also noted in the sphenoid sinus. Other: Comminuted bilateral nasal bone fractures are seen with mild displacement. A right forehead hematoma is noted. IMPRESSION: Atrophic changes. Bilateral nasal bone fractures and right forehead hematoma. Electronically Signed   By: Alcide Clever M.D.   On: 04/02/2016 15:13    ____________________________________________   PROCEDURES  Procedure(s) performed: None  Procedures  Critical Care performed: No  ____________________________________________   INITIAL IMPRESSION / ASSESSMENT AND PLAN / ED COURSE  Pertinent labs & imaging results that were available during my care of the patient were reviewed by me and considered in my medical decision making (see chart for details).  Patient tripped, fell forward striking his face. Also tenderness over the right wrist. He is anticoagulated, thankfully his CT scan does not demonstrate intracranial hemorrhage however his CT does demonstrate nasal fractures and air-fluid levels in multiple sinuses. Discussed the CT results  with Dr. Elenore Rota from ear nose [facility] throat, recommends placing patient on cephalexin and follow-up in about 1 week in his clinic  ----------------------------------------- 5:29 PM on 04/02/2016 -----------------------------------------  Patient reports headache improving. He is awake alert in no distress at this time. No longer bleeding from the nose. No septal hematoma.      ____________________________________________   FINAL CLINICAL IMPRESSION(S) / ED DIAGNOSES  Final diagnoses:  Fall  Closed fracture of nasal bone, initial encounter  Closed head injury, initial encounter  Contusion of right wrist, initial encounter      NEW MEDICATIONS STARTED DURING THIS VISIT:  New Prescriptions   CEPHALEXIN (KEFLEX) 500 MG CAPSULE    Take 1 capsule (500 mg total) by mouth 4 (four) times daily.     Note:  This document was prepared using Dragon voice recognition software and may include unintentional dictation errors.     Sharyn Creamer, MD 04/02/16 [id number]

## 2016-04-02 NOTE — ED Notes (Signed)
Pt returned from US, resting in bed, family at bedside 

## 2016-04-02 NOTE — ED Notes (Signed)
Patient transported to CT 

## 2016-04-02 NOTE — ED Notes (Signed)
Pt denies any neck pain.

## 2016-04-02 NOTE — ED Notes (Signed)
EDP at bedside  

## 2016-04-07 DIAGNOSIS — S022XXA Fracture of nasal bones, initial encounter for closed fracture: Secondary | ICD-10-CM | POA: Diagnosis not present

## 2016-04-12 ENCOUNTER — Encounter: Payer: No Typology Code available for payment source | Attending: Internal Medicine | Admitting: *Deleted

## 2016-04-12 VITALS — Ht 73.0 in | Wt 260.8 lb

## 2016-04-12 DIAGNOSIS — Z951 Presence of aortocoronary bypass graft: Secondary | ICD-10-CM | POA: Diagnosis not present

## 2016-04-12 DIAGNOSIS — I2510 Atherosclerotic heart disease of native coronary artery without angina pectoris: Secondary | ICD-10-CM | POA: Diagnosis not present

## 2016-04-12 NOTE — Progress Notes (Signed)
Cardiac Individual Treatment Plan  Patient Details  Name: RIDGELY ANASTACIO Sr. MRN: [id number] Date of Birth: [birth year] Referring Provider:     Cardiac Rehab from 04/12/2016 in [facility] Cardiac and Pulmonary Rehab  Referring Provider  Hinderliter, Hessie Diener MD      Initial Encounter Date:    Cardiac Rehab from 04/12/2016 in [facility] Cardiac and Pulmonary Rehab  Date  04/12/16  Referring Provider  Hinderliter, Hessie Diener MD      Visit Diagnosis: S/P CABG (coronary artery bypass graft)  Patient's Home Medications on Admission:  Current Outpatient Prescriptions:  .  acetaminophen (TYLENOL) 500 MG tablet, Take 1,000 mg by mouth 3 (three) times daily. , Disp: , Rfl:  .  albuterol (PROVENTIL HFA;VENTOLIN HFA) 108 (90 Base) MCG/ACT inhaler, Inhale 2 puffs into the lungs every 6 (six) hours as needed for wheezing., Disp: , Rfl:  .  amiodarone (PACERONE) 200 MG tablet, Take 200 mg by mouth daily., Disp: , Rfl:  .  apixaban (ELIQUIS) 2.5 MG TABS tablet, Take 2.5 mg by mouth 2 (two) times daily., Disp: , Rfl:  .  aspirin EC 81 MG tablet, Take 81 mg by mouth., Disp: , Rfl:  .  atorvastatin (LIPITOR) 80 MG tablet, Take 80 mg by mouth daily., Disp: , Rfl:  .  Calcium Carbonate-Vitamin D 600-400 MG-UNIT tablet, Take 1 tablet by mouth 2 (two) times daily with a meal., Disp: , Rfl:  .  cephALEXin (KEFLEX) 500 MG capsule, Take 1 capsule (500 mg total) by mouth 4 (four) times daily., Disp: 40 capsule, Rfl: 0 .  cetirizine (ZYRTEC) 10 MG tablet, Take 10 mg by mouth daily., Disp: , Rfl:  .  FLUoxetine (PROZAC) 20 MG tablet, Take 80 mg by mouth daily., Disp: , Rfl:  .  gabapentin (NEURONTIN) 300 MG capsule, Take 600 mg by mouth 3 (three) times daily., Disp: , Rfl:  .  glucosamine-chondroitin 500-400 MG tablet, Take 1 tablet by mouth 2 (two) times daily., Disp: , Rfl:  .  hydrocortisone 2.5 % cream, Apply 1 application topically 2 (two) times daily., Disp: , Rfl:  .  lisinopril (PRINIVIL,ZESTRIL) 2.5 MG tablet, Take 2.5 mg by  mouth daily., Disp: , Rfl:  .  magnesium oxide (MAG-OX) 400 MG tablet, Take 400 mg by mouth 2 (two) times daily., Disp: , Rfl:  .  metFORMIN (GLUCOPHAGE) 500 MG tablet, Take 500 mg by mouth 2 (two) times daily with a meal., Disp: , Rfl:  .  metoprolol succinate (TOPROL-XL) 25 MG 24 hr tablet, Take 12.5 mg by mouth daily. , Disp: , Rfl:  .  mirtazapine (REMERON) 15 MG tablet, Take 45 mg by mouth at bedtime., Disp: , Rfl:  .  pramipexole (MIRAPEX) 1 MG tablet, Take 0.5 mg by mouth every evening., Disp: , Rfl:  .  prochlorperazine (COMPAZINE) 5 MG tablet, Take 10 mg by mouth every 8 (eight) hours as needed for nausea., Disp: , Rfl:  .  ranitidine (ZANTAC) 150 MG tablet, Take 150 mg by mouth 2 (two) times daily., Disp: , Rfl:   Past Medical History: Past Medical History:  Diagnosis Date  . COPD (chronic obstructive pulmonary disease) (HCC)   . Diabetes mellitus without complication (HCC)   . Hypertension     Tobacco Use: History  Smoking Status  . Never Smoker  Smokeless Tobacco  . Never Used    Labs: Recent Review Flowsheet Data    There is no flowsheet data to display.       Exercise Target Goals: Date:  04/12/16  Exercise Program Goal: Individual exercise prescription set with THRR, safety & activity barriers. Participant demonstrates ability to understand and report RPE using BORG scale, to self-measure pulse accurately, and to acknowledge the importance of the exercise prescription.  Exercise Prescription Goal: Starting with aerobic activity 30 plus minutes a day, 3 days per week for initial exercise prescription. Provide home exercise prescription and guidelines that participant acknowledges understanding prior to discharge.  Activity Barriers & Risk Stratification:     Activity Barriers & Cardiac Risk Stratification - 04/12/16 1308      Activity Barriers & Cardiac Risk Stratification   Activity Barriers Arthritis;Back Problems;Joint Problems;Deconditioning;Muscular  Weakness;Shortness of Breath;Assistive Device;History of Falls;Balance Concerns  fractured hip in [employer]   Cardiac Risk Stratification High      6 Minute Walk:     6 Minute Walk    Row Name 04/12/16 1532         6 Minute Walk   Phase Initial     Distance 1130 feet     Walk Time 6 minutes     # of Rest Breaks 0     MPH 2.14     METS 2.47     RPE 15     Perceived Dyspnea  4     VO2 Peak 8.66     Symptoms Yes (comment)     Comments SOB     Resting HR 77 bpm     Resting BP 180/80  recheck 134/74     Max Ex. HR 93 bpm     Max Ex. BP 186/74     2 Minute Post BP 146/74        Oxygen Initial Assessment:   Oxygen Re-Evaluation:   Oxygen Discharge (Final Oxygen Re-Evaluation):   Initial Exercise Prescription:     Initial Exercise Prescription - 04/12/16 1500      Date of Initial Exercise RX and Referring Provider   Date 04/12/16   Referring Provider Hinderliter, Alan MD     Treadmill   MPH 1.8   Grade 0.5   Minutes 15   METs 2.5     NuStep   Level 2   SPM 80   Minutes 15   METs 2     Biostep-RELP   Level 3   SPM 50   Minutes 15   METs 2     Prescription Details   Frequency (times per week) 2   Duration Progress to 45 minutes of aerobic exercise without signs/symptoms of physical distress     Intensity   THRR 40-80% of Max Heartrate 106-135   Ratings of Perceived Exertion 11-13   Perceived Dyspnea 0-4     Progression   Progression Continue to progress workloads to maintain intensity without signs/symptoms of physical distress.     Resistance Training   Training Prescription Yes   Weight 2 lbs   Reps 10-15      Perform Capillary Blood Glucose checks as needed.  Exercise Prescription Changes:   Exercise Comments:   Exercise Goals and Review:      Exercise Goals    Row Name 04/12/16 1536             Exercise Goals   Increase Physical Activity Yes       Intervention Provide advice, education, support and counseling about  physical activity/exercise needs.;Develop an individualized exercise prescription for aerobic and resistive training based on initial evaluation findings, risk stratification, comorbidities and participant's personal goals.  Expected Outcomes Achievement of increased cardiorespiratory fitness and enhanced flexibility, muscular endurance and strength shown through measurements of functional capacity and personal statement of participant.       Increase Strength and Stamina Yes  more energy and strength       Intervention Provide advice, education, support and counseling about physical activity/exercise needs.;Develop an individualized exercise prescription for aerobic and resistive training based on initial evaluation findings, risk stratification, comorbidities and participant's personal goals.       Expected Outcomes Achievement of increased cardiorespiratory fitness and enhanced flexibility, muscular endurance and strength shown through measurements of functional capacity and personal statement of participant.          Exercise Goals Re-Evaluation :   Discharge Exercise Prescription (Final Exercise Prescription Changes):   Nutrition:  Target Goals: Understanding of nutrition guidelines, daily intake of sodium 1500mg , cholesterol 200mg , calories 30% from fat and 7% or less from saturated fats, daily to have 5 or more servings of fruits and vegetables.  Biometrics:     Pre Biometrics - 04/12/16 1536      Pre Biometrics   Height 6\' 1"  (1.854 m)   Weight 260 lb 12.8 oz (118.3 kg)   Waist Circumference 47 inches   Hip Circumference 47 inches   Waist to Hip Ratio 1 %   BMI (Calculated) 34.5   Single Leg Stand 9.28 seconds       Nutrition Therapy Plan and Nutrition Goals:     Nutrition Therapy & Goals - 04/12/16 1308      Nutrition Therapy   Drug/Food Interactions Statins/Certain Fruits      Nutrition Discharge: Rate Your Plate Scores:   Nutrition Goals  Re-Evaluation:   Nutrition Goals Discharge (Final Nutrition Goals Re-Evaluation):   Psychosocial: Target Goals: Acknowledge presence or absence of significant depression and/or stress, maximize coping skills, provide positive support system. Participant is able to verbalize types and ability to use techniques and skills needed for reducing stress and depression.   Initial Review & Psychosocial Screening:     Initial Psych Review & Screening - 04/12/16 1311      Initial Review   Current issues with Current Stress Concerns   Source of Stress Concerns Chronic Illness;Occupation     Family Dynamics   Good Support System? Yes     Barriers   Psychosocial barriers to participate in program The patient should benefit from training in stress management and relaxation.     Screening Interventions   Interventions Yes;Encouraged to exercise;Program counselor consult   Expected Outcomes Short Term goal: Utilizing psychosocial counselor, staff and physician to assist with identification of specific Stressors or current issues interfering with healing process. Setting desired goal for each stressor or current issue identified.;Long Term Goal: Stressors or current issues are controlled or eliminated.;Short Term goal: Identification and review with participant of any Quality of Life or Depression concerns found by scoring the questionnaire.      Quality of Life Scores:    PHQ-9: Recent Review Flowsheet Data    Depression screen [facility] 2/9 04/12/2016   Decreased Interest 0   Down, Depressed, Hopeless 2   PHQ - 2 Score 2   Altered sleeping 2   Tired, decreased energy 3   Change in appetite 0   Feeling bad or failure about yourself  1   Trouble concentrating 2   Moving slowly or fidgety/restless 0   Suicidal thoughts 1   PHQ-9 Score 11   Difficult doing work/chores Somewhat difficult  Interpretation of Total Score  Total Score Depression Severity:  1-4 = Minimal depression, 5-9 = Mild  depression, 10-14 = Moderate depression, 15-19 = Moderately severe depression, 20-27 = Severe depression   Psychosocial Evaluation and Intervention:   Psychosocial Re-Evaluation:   Psychosocial Discharge (Final Psychosocial Re-Evaluation):   Vocational Rehabilitation: Provide vocational rehab assistance to qualifying candidates.   Vocational Rehab Evaluation & Intervention:     Vocational Rehab - 04/12/16 1255      Initial Vocational Rehab Evaluation & Intervention   Assessment shows need for Vocational Rehabilitation No      Education: Education Goals: Education classes will be provided on a weekly basis, covering required topics. Participant will state understanding/return demonstration of topics presented.  Learning Barriers/Preferences:     Learning Barriers/Preferences - 04/12/16 1255      Learning Barriers/Preferences   Learning Barriers Sight;Hearing   Learning Preferences None      Education Topics: General Nutrition Guidelines/Fats and Fiber: -Group instruction provided by verbal, written material, models and posters to present the general guidelines for heart healthy nutrition. Gives an explanation and review of dietary fats and fiber.   Controlling Sodium/Reading Food Labels: -Group verbal and written material supporting the discussion of sodium use in heart healthy nutrition. Review and explanation with models, verbal and written materials for utilization of the food label.   Exercise Physiology & Risk Factors: - Group verbal and written instruction with models to review the exercise physiology of the cardiovascular system and associated critical values. Details cardiovascular disease risk factors and the goals associated with each risk factor.   Aerobic Exercise & Resistance Training: - Gives group verbal and written discussion on the health impact of inactivity. On the components of aerobic and resistive training programs and the benefits of this  training and how to safely progress through these programs.   Flexibility, Balance, General Exercise Guidelines: - Provides group verbal and written instruction on the benefits of flexibility and balance training programs. Provides general exercise guidelines with specific guidelines to those with heart or lung disease. Demonstration and skill practice provided.   Stress Management: - Provides group verbal and written instruction about the health risks of elevated stress, cause of high stress, and healthy ways to reduce stress.   Depression: - Provides group verbal and written instruction on the correlation between heart/lung disease and depressed mood, treatment options, and the stigmas associated with seeking treatment.   Anatomy & Physiology of the Heart: - Group verbal and written instruction and models provide basic cardiac anatomy and physiology, with the coronary electrical and arterial systems. Review of: AMI, Angina, Valve disease, Heart Failure, Cardiac Arrhythmia, Pacemakers, and the ICD.   Cardiac Procedures: - Group verbal and written instruction and models to describe the testing methods done to diagnose heart disease. Reviews the outcomes of the test results. Describes the treatment choices: Medical Management, Angioplasty, or Coronary Bypass Surgery.   Cardiac Medications: - Group verbal and written instruction to review commonly prescribed medications for heart disease. Reviews the medication, class of the drug, and side effects. Includes the steps to properly store meds and maintain the prescription regimen.   Go Sex-Intimacy & Heart Disease, Get SMART - Goal Setting: - Group verbal and written instruction through game format to discuss heart disease and the return to sexual intimacy. Provides group verbal and written material to discuss and apply goal setting through the application of the S.M.A.R.T. Method.   Other Matters of the Heart: - Provides group verbal,  written materials  and models to describe Heart Failure, Angina, Valve Disease, and Diabetes in the realm of heart disease. Includes description of the disease process and treatment options available to the cardiac patient.   Exercise & Equipment Safety: - Individual verbal instruction and demonstration of equipment use and safety with use of the equipment.   Cardiac Rehab from 04/12/2016 in [facility] Cardiac and Pulmonary Rehab  Date  04/12/16  Educator  C. EnterkinRN  Instruction Review Code  1- partially meets, needs review/practice      Infection Prevention: - Provides verbal and written material to individual with discussion of infection control including proper hand washing and proper equipment cleaning during exercise session.   Cardiac Rehab from 04/12/2016 in [facility] Cardiac and Pulmonary Rehab  Date  04/12/16  Educator  C. Lafayette Dunlevy,RN  Instruction Review Code  2- meets goals/outcomes      Falls Prevention: - Provides verbal and written material to individual with discussion of falls prevention and safety.   Cardiac Rehab from 04/12/2016 in [facility] Cardiac and Pulmonary Rehab  Date  04/12/16  Educator  C. EnterkinRN  Instruction Review Code  1- partially meets, needs review/practice      Diabetes: - Individual verbal and written instruction to review signs/symptoms of diabetes, desired ranges of glucose level fasting, after meals and with exercise. Advice that pre and post exercise glucose checks will be done for 3 sessions at entry of program.   Cardiac Rehab from 04/12/2016 in [facility] Cardiac and Pulmonary Rehab  Date  04/12/16  Educator  C. EnterkinRN  Instruction Review Code  1- partially meets, needs review/practice       Knowledge Questionnaire Score:     Knowledge Questionnaire Score - 04/12/16 1255      Knowledge Questionnaire Score   Pre Score 18/28      Core Components/Risk Factors/Patient Goals at Admission:     Personal Goals and Risk Factors at Admission -  04/12/16 1308      Core Components/Risk Factors/Patient Goals on Admission    Weight Management Yes;Obesity;Weight Maintenance   Intervention Weight Management: Develop a combined nutrition and exercise program designed to reach desired caloric intake, while maintaining appropriate intake of nutrient and fiber, sodium and fats, and appropriate energy expenditure required for the weight goal.;Weight Management: Provide education and appropriate resources to help participant work on and attain dietary goals.;Weight Management/Obesity: Establish reasonable short term and long term weight goals.;Obesity: Provide education and appropriate resources to help participant work on and attain dietary goals.   Admit Weight 260 lb 12.8 oz (118.3 kg)   Goal Weight: Short Term 230 lb (104.3 kg)   Goal Weight: Long Term 185 lb (83.9 kg)   Expected Outcomes Short Term: Continue to assess and modify interventions until short term weight is achieved;Weight Loss: Understanding of general recommendations for a balanced deficit meal plan, which promotes 1-2 lb weight loss per week and includes a negative energy balance of [phone] kcal/d;Understanding recommendations for meals to include 15-35% energy as protein, 25-35% energy from fat, 35-60% energy from carbohydrates, less than 200mg  of dietary cholesterol, 20-35 gm of total fiber daily;Understanding of distribution of calorie intake throughout the day with the consumption of 4-5 meals/snacks   Improve shortness of breath with ADL's Yes   Intervention Provide education, individualized exercise plan and daily activity instruction to help decrease symptoms of SOB with activities of daily living.   Expected Outcomes Short Term: Achieves a reduction of symptoms when performing activities of daily living.   Develop more efficient  breathing techniques such as purse lipped breathing and diaphragmatic breathing; and practicing self-pacing with activity Yes   Intervention Provide  education, demonstration and support about specific breathing techniuqes utilized for more efficient breathing. Include techniques such as pursed lipped breathing, diaphragmatic breathing and self-pacing activity.   Expected Outcomes Short Term: Participant will be able to demonstrate and use breathing techniques as needed throughout daily activities.   Diabetes Yes   Intervention Provide education about signs/symptoms and action to take for hypo/hyperglycemia.;Provide education about proper nutrition, including hydration, and aerobic/resistive exercise prescription along with prescribed medications to achieve blood glucose in normal ranges: Fasting glucose 65-99 mg/dL   Expected Outcomes Short Term: Participant verbalizes understanding of the signs/symptoms and immediate care of hyper/hypoglycemia, proper foot care and importance of medication, aerobic/resistive exercise and nutrition plan for blood glucose control.;Long Term: Attainment of HbA1C < 7%.   Hypertension Yes   Intervention Monitor prescription use compliance.;Provide education on lifestyle modifcations including regular physical activity/exercise, weight management, moderate sodium restriction and increased consumption of fresh fruit, vegetables, and low fat dairy, alcohol moderation, and smoking cessation.   Expected Outcomes Short Term: Continued assessment and intervention until BP is < 140/73mm HG in hypertensive participants. < 130/21mm HG in hypertensive participants with diabetes, heart failure or chronic kidney disease.;Long Term: Maintenance of blood pressure at goal levels.   Lipids Yes   Intervention Provide education and support for participant on nutrition & aerobic/resistive exercise along with prescribed medications to achieve LDL 70mg , HDL >40mg .   Expected Outcomes Short Term: Participant states understanding of desired cholesterol values and is compliant with medications prescribed. Participant is following exercise  prescription and nutrition guidelines.;Long Term: Cholesterol controlled with medications as prescribed, with individualized exercise RX and with personalized nutrition plan. Value goals: LDL < 70mg , HDL > 40 mg.   Stress Yes   Intervention Offer individual and/or small group education and counseling on adjustment to heart disease, stress management and health-related lifestyle change. Teach and support self-help strategies.;Refer participants experiencing significant psychosocial distress to appropriate mental health specialists for further evaluation and treatment. When possible, include family members and significant others in education/counseling sessions.   Expected Outcomes Short Term: Participant demonstrates changes in health-related behavior, relaxation and other stress management skills, ability to obtain effective social support, and compliance with psychotropic medications if prescribed.;Long Term: Emotional wellbeing is indicated by absence of clinically significant psychosocial distress or social isolation.      Core Components/Risk Factors/Patient Goals Review:    Core Components/Risk Factors/Patient Goals at Discharge (Final Review):    ITP Comments:     ITP Comments    Row Name 04/12/16 1305           ITP Comments ITP Created during Medical Review, Documenation of DX Veteran's Admin Notes which will be scanned.           Comments: Ready to start Cardiac Rehab. Evelio got short of breath during his 6 minute walk although his pulse ox was 95% upon completion. He used his can also.

## 2016-04-12 NOTE — Progress Notes (Signed)
Daily Session Note  Patient Details  Name: Scott BLAHUT Sr. MRN: [id number] Date of Birth: 03-Oct-1945 Referring Provider:    Encounter Date: 04/12/2016  Check In:     Session Check In - 04/12/16 1303      Check-In   Location [facility]-Cardiac & Pulmonary Rehab   Staff Present Heath Lark, RN, BSN, CCRP;Stokes Rattigan, RN, Levie Heritage, MA, ACSM RCEP, Exercise Physiologist   Supervising physician immediately available to respond to emergencies See telemetry face sheet for immediately available ER MD   Medication changes reported     No   Fall or balance concerns reported    Yes   Warm-up and Cool-down Performed as group-led instruction   Resistance Training Performed Yes   VAD Patient? No     Pain Assessment   Currently in Pain? No/denies         History  Smoking Status  . Never Smoker  Smokeless Tobacco  . Never Used    Goals Met:  Personal goals reviewed No report of cardiac concerns or symptoms  Goals Unmet:  Not Applicable  Comments:     Dr. Emily Filbert is Medical Director for [facility].

## 2016-04-12 NOTE — Patient Instructions (Addendum)
Patient Instructions  Patient Details  Name: Scott REFFETT Sr. MRN: [id number] Date of Birth: 12/01/1945 Referring Provider:  [facility]  Below are the personal goals you chose as well as exercise and nutrition goals. Our goal is to help you keep on track towards obtaining and maintaining your goals. We will be discussing your progress on these goals with you throughout the program.  Initial Exercise Prescription:     Initial Exercise Prescription - 04/12/16 1500      Date of Initial Exercise RX and Referring Provider   Date 04/12/16   Referring Provider Hinderliter, Alan MD     Treadmill   MPH 1.8   Grade 0.5   Minutes 15   METs 2.5     NuStep   Level 2   SPM 80   Minutes 15   METs 2     Biostep-RELP   Level 3   SPM 50   Minutes 15   METs 2     Prescription Details   Frequency (times per week) 2   Duration Progress to 45 minutes of aerobic exercise without signs/symptoms of physical distress     Intensity   THRR 40-80% of Max Heartrate 106-135   Ratings of Perceived Exertion 11-13   Perceived Dyspnea 0-4     Progression   Progression Continue to progress workloads to maintain intensity without signs/symptoms of physical distress.     Resistance Training   Training Prescription Yes   Weight 2 lbs   Reps 10-15      Exercise Goals: Frequency: Be able to perform aerobic exercise three times per week working toward 3-5 days per week.  Intensity: Work with a perceived exertion of 11 (fairly light) - 15 (hard) as tolerated. Follow your new exercise prescription and watch for changes in prescription as you progress with the program. Changes will be reviewed with you when they are made.  Duration: You should be able to do 30 minutes of continuous aerobic exercise in addition to a 5 minute warm-up and a 5 minute cool-down routine.  Nutrition Goals: Your personal nutrition goals will be established when you do your nutrition analysis with the  dietician.  The following are nutrition guidelines to follow: Cholesterol < 200mg /day Sodium < 1500mg /day Fiber: Men over 50 yrs - 30 grams per day  Personal Goals:     Personal Goals and Risk Factors at Admission - 04/12/16 1308      Core Components/Risk Factors/Patient Goals on Admission    Weight Management Yes;Obesity;Weight Maintenance   Intervention Weight Management: Develop a combined nutrition and exercise program designed to reach desired caloric intake, while maintaining appropriate intake of nutrient and fiber, sodium and fats, and appropriate energy expenditure required for the weight goal.;Weight Management: Provide education and appropriate resources to help participant work on and attain dietary goals.;Weight Management/Obesity: Establish reasonable short term and long term weight goals.;Obesity: Provide education and appropriate resources to help participant work on and attain dietary goals.   Admit Weight 257 lb 11.2 oz (116.9 kg)   Goal Weight: Short Term 230 lb (104.3 kg)   Goal Weight: Long Term 185 lb (83.9 kg)   Expected Outcomes Short Term: Continue to assess and modify interventions until short term weight is achieved;Weight Loss: Understanding of general recommendations for a balanced deficit meal plan, which promotes 1-2 lb weight loss per week and includes a negative energy balance of [phone] kcal/d;Understanding recommendations for meals to include 15-35% energy as protein, 25-35% energy from  fat, 35-60% energy from carbohydrates, less than 200mg  of dietary cholesterol, 20-35 gm of total fiber daily;Understanding of distribution of calorie intake throughout the day with the consumption of 4-5 meals/snacks   Improve shortness of breath with ADL's Yes   Intervention Provide education, individualized exercise plan and daily activity instruction to help decrease symptoms of SOB with activities of daily living.   Expected Outcomes Short Term: Achieves a reduction of  symptoms when performing activities of daily living.   Develop more efficient breathing techniques such as purse lipped breathing and diaphragmatic breathing; and practicing self-pacing with activity Yes   Intervention Provide education, demonstration and support about specific breathing techniuqes utilized for more efficient breathing. Include techniques such as pursed lipped breathing, diaphragmatic breathing and self-pacing activity.   Expected Outcomes Short Term: Participant will be able to demonstrate and use breathing techniques as needed throughout daily activities.   Diabetes Yes   Intervention Provide education about signs/symptoms and action to take for hypo/hyperglycemia.;Provide education about proper nutrition, including hydration, and aerobic/resistive exercise prescription along with prescribed medications to achieve blood glucose in normal ranges: Fasting glucose 65-99 mg/dL   Expected Outcomes Short Term: Participant verbalizes understanding of the signs/symptoms and immediate care of hyper/hypoglycemia, proper foot care and importance of medication, aerobic/resistive exercise and nutrition plan for blood glucose control.;Long Term: Attainment of HbA1C < 7%.   Hypertension Yes   Intervention Monitor prescription use compliance.;Provide education on lifestyle modifcations including regular physical activity/exercise, weight management, moderate sodium restriction and increased consumption of fresh fruit, vegetables, and low fat dairy, alcohol moderation, and smoking cessation.   Expected Outcomes Short Term: Continued assessment and intervention until BP is < 140/6990mm HG in hypertensive participants. < 130/6080mm HG in hypertensive participants with diabetes, heart failure or chronic kidney disease.;Long Term: Maintenance of blood pressure at goal levels.   Lipids Yes   Intervention Provide education and support for participant on nutrition & aerobic/resistive exercise along with prescribed  medications to achieve LDL 70mg , HDL >40mg .   Expected Outcomes Short Term: Participant states understanding of desired cholesterol values and is compliant with medications prescribed. Participant is following exercise prescription and nutrition guidelines.;Long Term: Cholesterol controlled with medications as prescribed, with individualized exercise RX and with personalized nutrition plan. Value goals: LDL < 70mg , HDL > 40 mg.   Stress Yes   Intervention Offer individual and/or small group education and counseling on adjustment to heart disease, stress management and health-related lifestyle change. Teach and support self-help strategies.;Refer participants experiencing significant psychosocial distress to appropriate mental health specialists for further evaluation and treatment. When possible, include family members and significant others in education/counseling sessions.   Expected Outcomes Short Term: Participant demonstrates changes in health-related behavior, relaxation and other stress management skills, ability to obtain effective social support, and compliance with psychotropic medications if prescribed.;Long Term: Emotional wellbeing is indicated by absence of clinically significant psychosocial distress or social isolation.      Tobacco Use Initial Evaluation: History  Smoking Status  . Never Smoker  Smokeless Tobacco  . Never Used    Copy of goals given to participant.

## 2016-04-14 ENCOUNTER — Encounter: Payer: Self-pay | Admitting: *Deleted

## 2016-04-14 DIAGNOSIS — Z951 Presence of aortocoronary bypass graft: Secondary | ICD-10-CM

## 2016-04-14 NOTE — Progress Notes (Signed)
Cardiac Individual Treatment Plan  Patient Details  Name: Scott ISHAM Sr. MRN: [id number] Date of Birth: November 28, 1945 Referring Provider:     Cardiac Rehab from 04/12/2016 in [facility] Cardiac and Pulmonary Rehab  Referring Provider  Hinderliter, Antony Haste MD      Initial Encounter Date:    Cardiac Rehab from 04/12/2016 in [facility] Cardiac and Pulmonary Rehab  Date  04/12/16  Referring Provider  Hinderliter, Antony Haste MD      Visit Diagnosis: S/P CABG (coronary artery bypass graft)  Patient's Home Medications on Admission:  Current Outpatient Prescriptions:  .  acetaminophen (TYLENOL) 500 MG tablet, Take 1,000 mg by mouth 3 (three) times daily. , Disp: , Rfl:  .  albuterol (PROVENTIL HFA;VENTOLIN HFA) 108 (90 Base) MCG/ACT inhaler, Inhale 2 puffs into the lungs every 6 (six) hours as needed for wheezing., Disp: , Rfl:  .  amiodarone (PACERONE) 200 MG tablet, Take 200 mg by mouth daily., Disp: , Rfl:  .  apixaban (ELIQUIS) 2.5 MG TABS tablet, Take 2.5 mg by mouth 2 (two) times daily., Disp: , Rfl:  .  aspirin EC 81 MG tablet, Take 81 mg by mouth., Disp: , Rfl:  .  atorvastatin (LIPITOR) 80 MG tablet, Take 80 mg by mouth daily., Disp: , Rfl:  .  Calcium Carbonate-Vitamin D 600-400 MG-UNIT tablet, Take 1 tablet by mouth 2 (two) times daily with a meal., Disp: , Rfl:  .  cephALEXin (KEFLEX) 500 MG capsule, Take 1 capsule (500 mg total) by mouth 4 (four) times daily., Disp: 40 capsule, Rfl: 0 .  cetirizine (ZYRTEC) 10 MG tablet, Take 10 mg by mouth daily., Disp: , Rfl:  .  FLUoxetine (PROZAC) 20 MG tablet, Take 80 mg by mouth daily., Disp: , Rfl:  .  gabapentin (NEURONTIN) 300 MG capsule, Take 600 mg by mouth 3 (three) times daily., Disp: , Rfl:  .  glucosamine-chondroitin 500-400 MG tablet, Take 1 tablet by mouth 2 (two) times daily., Disp: , Rfl:  .  hydrocortisone 2.5 % cream, Apply 1 application topically 2 (two) times daily., Disp: , Rfl:  .  lisinopril (PRINIVIL,ZESTRIL) 2.5 MG tablet, Take 2.5 mg by  mouth daily., Disp: , Rfl:  .  magnesium oxide (MAG-OX) 400 MG tablet, Take 400 mg by mouth 2 (two) times daily., Disp: , Rfl:  .  metFORMIN (GLUCOPHAGE) 500 MG tablet, Take 500 mg by mouth 2 (two) times daily with a meal., Disp: , Rfl:  .  metoprolol succinate (TOPROL-XL) 25 MG 24 hr tablet, Take 12.5 mg by mouth daily. , Disp: , Rfl:  .  mirtazapine (REMERON) 15 MG tablet, Take 45 mg by mouth at bedtime., Disp: , Rfl:  .  pramipexole (MIRAPEX) 1 MG tablet, Take 0.5 mg by mouth every evening., Disp: , Rfl:  .  prochlorperazine (COMPAZINE) 5 MG tablet, Take 10 mg by mouth every 8 (eight) hours as needed for nausea., Disp: , Rfl:  .  ranitidine (ZANTAC) 150 MG tablet, Take 150 mg by mouth 2 (two) times daily., Disp: , Rfl:   Past Medical History: Past Medical History:  Diagnosis Date  . COPD (chronic obstructive pulmonary disease) ([place])   . Diabetes mellitus without complication ([place])   . Hypertension     Tobacco Use: History  Smoking Status  . Never Smoker  Smokeless Tobacco  . Never Used    Labs: Recent Review Flowsheet Data    There is no flowsheet data to display.       Exercise Target Goals:  Exercise Program Goal: Individual exercise prescription set with THRR, safety & activity barriers. Participant demonstrates ability to understand and report RPE using BORG scale, to self-measure pulse accurately, and to acknowledge the importance of the exercise prescription.  Exercise Prescription Goal: Starting with aerobic activity 30 plus minutes a day, 3 days per week for initial exercise prescription. Provide home exercise prescription and guidelines that participant acknowledges understanding prior to discharge.  Activity Barriers & Risk Stratification:     Activity Barriers & Cardiac Risk Stratification - 04/12/16 1308      Activity Barriers & Cardiac Risk Stratification   Activity Barriers Arthritis;Back Problems;Joint Problems;Deconditioning;Muscular  Weakness;Shortness of Breath;Assistive Device;History of Falls;Balance Concerns  fractured hip in [employer]   Cardiac Risk Stratification High      6 Minute Walk:     6 Minute Walk    Row Name 04/12/16 1532         6 Minute Walk   Phase Initial     Distance 1130 feet     Walk Time 6 minutes     # of Rest Breaks 0     MPH 2.14     METS 2.47     RPE 15     Perceived Dyspnea  4     VO2 Peak 8.66     Symptoms Yes (comment)     Comments SOB     Resting HR 77 bpm     Resting BP 180/80  recheck 134/74     Max Ex. HR 93 bpm     Max Ex. BP 186/74     2 Minute Post BP 146/74        Oxygen Initial Assessment:   Oxygen Re-Evaluation:   Oxygen Discharge (Final Oxygen Re-Evaluation):   Initial Exercise Prescription:     Initial Exercise Prescription - 04/12/16 1500      Date of Initial Exercise RX and Referring Provider   Date 04/12/16   Referring Provider Hinderliter, Alan MD     Treadmill   MPH 1.8   Grade 0.5   Minutes 15   METs 2.5     NuStep   Level 2   SPM 80   Minutes 15   METs 2     Biostep-RELP   Level 3   SPM 50   Minutes 15   METs 2     Prescription Details   Frequency (times per week) 2   Duration Progress to 45 minutes of aerobic exercise without signs/symptoms of physical distress     Intensity   THRR 40-80% of Max Heartrate 106-135   Ratings of Perceived Exertion 11-13   Perceived Dyspnea 0-4     Progression   Progression Continue to progress workloads to maintain intensity without signs/symptoms of physical distress.     Resistance Training   Training Prescription Yes   Weight 2 lbs   Reps 10-15      Perform Capillary Blood Glucose checks as needed.  Exercise Prescription Changes:   Exercise Comments:   Exercise Goals and Review:     Exercise Goals    Row Name 04/12/16 1536             Exercise Goals   Increase Physical Activity Yes       Intervention Provide advice, education, support and counseling about  physical activity/exercise needs.;Develop an individualized exercise prescription for aerobic and resistive training based on initial evaluation findings, risk stratification, comorbidities and participant's personal goals.       Expected Outcomes  Achievement of increased cardiorespiratory fitness and enhanced flexibility, muscular endurance and strength shown through measurements of functional capacity and personal statement of participant.       Increase Strength and Stamina Yes  more energy and strength       Intervention Provide advice, education, support and counseling about physical activity/exercise needs.;Develop an individualized exercise prescription for aerobic and resistive training based on initial evaluation findings, risk stratification, comorbidities and participant's personal goals.       Expected Outcomes Achievement of increased cardiorespiratory fitness and enhanced flexibility, muscular endurance and strength shown through measurements of functional capacity and personal statement of participant.          Exercise Goals Re-Evaluation :   Discharge Exercise Prescription (Final Exercise Prescription Changes):   Nutrition:  Target Goals: Understanding of nutrition guidelines, daily intake of sodium 1500mg , cholesterol 200mg , calories 30% from fat and 7% or less from saturated fats, daily to have 5 or more servings of fruits and vegetables.  Biometrics:     Pre Biometrics - 04/12/16 1536      Pre Biometrics   Height 6\' 1"  (1.854 m)   Weight 260 lb 12.8 oz (118.3 kg)   Waist Circumference 47 inches   Hip Circumference 47 inches   Waist to Hip Ratio 1 %   BMI (Calculated) 34.5   Single Leg Stand 9.28 seconds       Nutrition Therapy Plan and Nutrition Goals:     Nutrition Therapy & Goals - 04/12/16 1308      Nutrition Therapy   Drug/Food Interactions Statins/Certain Fruits      Nutrition Discharge: Rate Your Plate Scores:   Nutrition Goals  Re-Evaluation:   Nutrition Goals Discharge (Final Nutrition Goals Re-Evaluation):   Psychosocial: Target Goals: Acknowledge presence or absence of significant depression and/or stress, maximize coping skills, provide positive support system. Participant is able to verbalize types and ability to use techniques and skills needed for reducing stress and depression.   Initial Review & Psychosocial Screening:     Initial Psych Review & Screening - 04/12/16 1311      Initial Review   Current issues with Current Stress Concerns   Source of Stress Concerns Chronic Illness;Occupation     Family Dynamics   Good Support System? Yes     Barriers   Psychosocial barriers to participate in program The patient should benefit from training in stress management and relaxation.     Screening Interventions   Interventions Yes;Encouraged to exercise;Program counselor consult   Expected Outcomes Short Term goal: Utilizing psychosocial counselor, staff and physician to assist with identification of specific Stressors or current issues interfering with healing process. Setting desired goal for each stressor or current issue identified.;Long Term Goal: Stressors or current issues are controlled or eliminated.;Short Term goal: Identification and review with participant of any Quality of Life or Depression concerns found by scoring the questionnaire.      Quality of Life Scores:    PHQ-9: Recent Review Flowsheet Data    Dep[facility]2016   Decreased Interest 0   Down, Depressed, Hopeless 2   PHQ - 2 Score 2   Altered sleeping 2   Tired, decreased energy 3   Change in appetite 0   Feeling bad or failure about yourself  1   Trouble concentrating 2   Moving slowly or fidgety/restless 0   Suicidal thoughts 1   PHQ-9 Score 11   Difficult doing work/chores Somewhat difficult     Interpretation  of Total Score  Total Score Depression Severity:  1-4 = Minimal depression, 5-9 = Mild  depression, 10-14 = Moderate depression, 15-19 = Moderately severe depression, 20-27 = Severe depression   Psychosocial Evaluation and Intervention:   Psychosocial Re-Evaluation:   Psychosocial Discharge (Final Psychosocial Re-Evaluation):   Vocational Rehabilitation: Provide vocational rehab assistance to qualifying candidates.   Vocational Rehab Evaluation & Intervention:     Vocational Rehab - 04/12/16 1255      Initial Vocational Rehab Evaluation & Intervention   Assessment shows need for Vocational Rehabilitation No      Education: Education Goals: Education classes will be provided on a weekly basis, covering required topics. Participant will state understanding/return demonstration of topics presented.  Learning Barriers/Preferences:     Learning Barriers/Preferences - 04/12/16 1255      Learning Barriers/Preferences   Learning Barriers Sight;Hearing   Learning Preferences None      Education Topics: General Nutrition Guidelines/Fats and Fiber: -Group instruction provided by verbal, written material, models and posters to present the general guidelines for heart healthy nutrition. Gives an explanation and review of dietary fats and fiber.   Controlling Sodium/Reading Food Labels: -Group verbal and written material supporting the discussion of sodium use in heart healthy nutrition. Review and explanation with models, verbal and written materials for utilization of the food label.   Exercise Physiology & Risk Factors: - Group verbal and written instruction with models to review the exercise physiology of the cardiovascular system and associated critical values. Details cardiovascular disease risk factors and the goals associated with each risk factor.   Aerobic Exercise & Resistance Training: - Gives group verbal and written discussion on the health impact of inactivity. On the components of aerobic and resistive training programs and the benefits of this  training and how to safely progress through these programs.   Flexibility, Balance, General Exercise Guidelines: - Provides group verbal and written instruction on the benefits of flexibility and balance training programs. Provides general exercise guidelines with specific guidelines to those with heart or lung disease. Demonstration and skill practice provided.   Stress Management: - Provides group verbal and written instruction about the health risks of elevated stress, cause of high stress, and healthy ways to reduce stress.   Depression: - Provides group verbal and written instruction on the correlation between heart/lung disease and depressed mood, treatment options, and the stigmas associated with seeking treatment.   Anatomy & Physiology of the Heart: - Group verbal and written instruction and models provide basic cardiac anatomy and physiology, with the coronary electrical and arterial systems. Review of: AMI, Angina, Valve disease, Heart Failure, Cardiac Arrhythmia, Pacemakers, and the ICD.   Cardiac Procedures: - Group verbal and written instruction and models to describe the testing methods done to diagnose heart disease. Reviews the outcomes of the test results. Describes the treatment choices: Medical Management, Angioplasty, or Coronary Bypass Surgery.   Cardiac Medications: - Group verbal and written instruction to review commonly prescribed medications for heart disease. Reviews the medication, class of the drug, and side effects. Includes the steps to properly store meds and maintain the prescription regimen.   Go Sex-Intimacy & Heart Disease, Get SMART - Goal Setting: - Group verbal and written instruction through game format to discuss heart disease and the return to sexual intimacy. Provides group verbal and written material to discuss and apply goal setting through the application of the S.M.A.R.T. Method.   Other Matters of the Heart: - Provides group verbal,  written materials and  models to describe Heart Failure, Angina, Valve Disease, and Diabetes in the realm of heart disease. Includes description of the disease process and treatment options available to the cardiac patient.   Exercise & Equipment Safety: - Individual verbal instruction and demonstration of equipment use and safety with use of the equipment.   Cardiac Rehab from 04/12/2016 in [facility] Cardiac and Pulmonary Rehab  Date  04/12/16  Educator  C. EnterkinRN  Instruction Review Code  1- partially meets, needs review/practice      Infection Prevention: - Provides verbal and written material to individual with discussion of infection control including proper hand washing and proper equipment cleaning during exercise session.   Cardiac Rehab from 04/12/2016 in [facility] Cardiac and Pulmonary Rehab  Date  04/12/16  Educator  C. Enterkin,RN  Instruction Review Code  2- meets goals/outcomes      Falls Prevention: - Provides verbal and written material to individual with discussion of falls prevention and safety.   Cardiac Rehab from 04/12/2016 in [facility] Cardiac and Pulmonary Rehab  Date  04/12/16  Educator  C. EnterkinRN  Instruction Review Code  1- partially meets, needs review/practice      Diabetes: - Individual verbal and written instruction to review signs/symptoms of diabetes, desired ranges of glucose level fasting, after meals and with exercise. Advice that pre and post exercise glucose checks will be done for 3 sessions at entry of program.   Cardiac Rehab from 04/12/2016 in [facility] Cardiac and Pulmonary Rehab  Date  04/12/16  Educator  C. EnterkinRN  Instruction Review Code  1- partially meets, needs review/practice       Knowledge Questionnaire Score:     Knowledge Questionnaire Score - 04/12/16 1255      Knowledge Questionnaire Score   Pre Score 18/28      Core Components/Risk Factors/Patient Goals at Admission:     Personal Goals and Risk Factors at Admission -  04/12/16 1308      Core Components/Risk Factors/Patient Goals on Admission    Weight Management Yes;Obesity;Weight Maintenance   Intervention Weight Management: Develop a combined nutrition and exercise program designed to reach desired caloric intake, while maintaining appropriate intake of nutrient and fiber, sodium and fats, and appropriate energy expenditure required for the weight goal.;Weight Management: Provide education and appropriate resources to help participant work on and attain dietary goals.;Weight Management/Obesity: Establish reasonable short term and long term weight goals.;Obesity: Provide education and appropriate resources to help participant work on and attain dietary goals.   Admit Weight 260 lb 12.8 oz (118.3 kg)   Goal Weight: Short Term 230 lb (104.3 kg)   Goal Weight: Long Term 185 lb (83.9 kg)   Expected Outcomes Short Term: Continue to assess and modify interventions until short term weight is achieved;Weight Loss: Understanding of general recommendations for a balanced deficit meal plan, which promotes 1-2 lb weight loss per week and includes a negative energy balance of [phone] kcal/d;Understanding recommendations for meals to include 15-35% energy as protein, 25-35% energy from fat, 35-60% energy from carbohydrates, less than 200mg  of dietary cholesterol, 20-35 gm of total fiber daily;Understanding of distribution of calorie intake throughout the day with the consumption of 4-5 meals/snacks   Improve shortness of breath with ADL's Yes   Intervention Provide education, individualized exercise plan and daily activity instruction to help decrease symptoms of SOB with activities of daily living.   Expected Outcomes Short Term: Achieves a reduction of symptoms when performing activities of daily living.   Develop more efficient breathing  techniques such as purse lipped breathing and diaphragmatic breathing; and practicing self-pacing with activity Yes   Intervention Provide  education, demonstration and support about specific breathing techniuqes utilized for more efficient breathing. Include techniques such as pursed lipped breathing, diaphragmatic breathing and self-pacing activity.   Expected Outcomes Short Term: Participant will be able to demonstrate and use breathing techniques as needed throughout daily activities.   Diabetes Yes   Intervention Provide education about signs/symptoms and action to take for hypo/hyperglycemia.;Provide education about proper nutrition, including hydration, and aerobic/resistive exercise prescription along with prescribed medications to achieve blood glucose in normal ranges: Fasting glucose 65-99 mg/dL   Expected Outcomes Short Term: Participant verbalizes understanding of the signs/symptoms and immediate care of hyper/hypoglycemia, proper foot care and importance of medication, aerobic/resistive exercise and nutrition plan for blood glucose control.;Long Term: Attainment of HbA1C < 7%.   Hypertension Yes   Intervention Monitor prescription use compliance.;Provide education on lifestyle modifcations including regular physical activity/exercise, weight management, moderate sodium restriction and increased consumption of fresh fruit, vegetables, and low fat dairy, alcohol moderation, and smoking cessation.   Expected Outcomes Short Term: Continued assessment and intervention until BP is < 140/82mm HG in hypertensive participants. < 130/4mm HG in hypertensive participants with diabetes, heart failure or chronic kidney disease.;Long Term: Maintenance of blood pressure at goal levels.   Lipids Yes   Intervention Provide education and support for participant on nutrition & aerobic/resistive exercise along with prescribed medications to achieve LDL 70mg , HDL >40mg .   Expected Outcomes Short Term: Participant states understanding of desired cholesterol values and is compliant with medications prescribed. Participant is following exercise  prescription and nutrition guidelines.;Long Term: Cholesterol controlled with medications as prescribed, with individualized exercise RX and with personalized nutrition plan. Value goals: LDL < 70mg , HDL > 40 mg.   Stress Yes   Intervention Offer individual and/or small group education and counseling on adjustment to heart disease, stress management and health-related lifestyle change. Teach and support self-help strategies.;Refer participants experiencing significant psychosocial distress to appropriate mental health specialists for further evaluation and treatment. When possible, include family members and significant others in education/counseling sessions.   Expected Outcomes Short Term: Participant demonstrates changes in health-related behavior, relaxation and other stress management skills, ability to obtain effective social support, and compliance with psychotropic medications if prescribed.;Long Term: Emotional wellbeing is indicated by absence of clinically significant psychosocial distress or social isolation.      Core Components/Risk Factors/Patient Goals Review:    Core Components/Risk Factors/Patient Goals at Discharge (Final Review):    ITP Comments:     ITP Comments    Row Name 04/12/16 1305 04/14/16 0611         ITP Comments ITP Created during Medical Review, Documenation of DX Veteran's Admin Notes which will be scanned.  30 day review. Continue with ITP unless directed changes per Medical Director review  New to program         Comments:

## 2016-04-20 ENCOUNTER — Encounter: Payer: Non-veteran care | Attending: Internal Medicine | Admitting: *Deleted

## 2016-04-20 DIAGNOSIS — Z951 Presence of aortocoronary bypass graft: Secondary | ICD-10-CM | POA: Diagnosis not present

## 2016-04-20 DIAGNOSIS — I2510 Atherosclerotic heart disease of native coronary artery without angina pectoris: Secondary | ICD-10-CM | POA: Diagnosis not present

## 2016-04-20 LAB — GLUCOSE, CAPILLARY
Glucose-Capillary: 120 mg/dL — ABNORMAL HIGH (ref 65–99)
Glucose-Capillary: 133 mg/dL — ABNORMAL HIGH (ref 65–99)

## 2016-04-20 NOTE — Progress Notes (Signed)
Daily Session Note  Patient Details  Name: Scott RUFFINS Sr. MRN: [id number] Date of Birth: January 14, 1946 Referring Provider:     Cardiac Rehab from 04/12/2016 in [facility] Cardiac and Pulmonary Rehab  Referring Provider  Frankey Poot MD      Encounter Date: 04/20/2016  Check In:     Session Check In - 04/20/16 0901      Check-In   Location [facility]-Cardiac & Pulmonary Rehab   Staff Present Alberteen Sam, MA, ACSM RCEP, Exercise Physiologist;Susanne Bice, RN, BSN, CCRP;Laureen Owens Shark, BS, RRT, Respiratory Therapist   Supervising physician immediately available to respond to emergencies See telemetry face sheet for immediately available ER MD   Medication changes reported     No   Fall or balance concerns reported    No   Warm-up and Cool-down Performed on first and last piece of equipment   Resistance Training Performed Yes   VAD Patient? No     Pain Assessment   Currently in Pain? No/denies   Multiple Pain Sites No         History  Smoking Status  . Never Smoker  Smokeless Tobacco  . Never Used    Goals Met:  Exercise tolerated well Personal goals reviewed No report of cardiac concerns or symptoms Strength training completed today  Goals Unmet:  Not Applicable  Comments: First full day of exercise!  Patient was oriented to gym and equipment including functions, settings, policies, and procedures.  Patient's individual exercise prescription and treatment plan were reviewed.  All starting workloads were established based on the results of the 6 minute walk test done at initial orientation visit.  The plan for exercise progression was also introduced and progression will be customized based on patient's performance and goals.    Dr. Emily Filbert is Medical Director for [facility] and [facility].

## 2016-04-22 DIAGNOSIS — Z951 Presence of aortocoronary bypass graft: Secondary | ICD-10-CM

## 2016-04-22 LAB — GLUCOSE, CAPILLARY
Glucose-Capillary: 123 mg/dL — ABNORMAL HIGH (ref 65–99)
Glucose-Capillary: 121 mg/dL — ABNORMAL HIGH (ref 65–99)

## 2016-04-22 NOTE — Progress Notes (Addendum)
Daily Session Note  Patient Details  Name: Scott MATHESON Sr. MRN: [id number] Date of Birth: Oct 31, 1945 Referring Provider:     Cardiac Rehab from 04/12/2016 in [facility] Cardiac and Pulmonary Rehab  Referring Provider  Frankey Poot MD      Encounter Date: 04/22/2016  Check In:     Session Check In - 04/22/16 0836      Check-In   Location [facility]-Cardiac & Pulmonary Rehab   Staff Present Heath Lark, RN, BSN, CCRP;Jessica Luan Pulling, MA, ACSM RCEP, Exercise Physiologist;Buffie Herne Oletta Darter, BA, ACSM CEP, Exercise Physiologist   Supervising physician immediately available to respond to emergencies See telemetry face sheet for immediately available ER MD   Medication changes reported     No   Fall or balance concerns reported    No   Warm-up and Cool-down Performed on first and last piece of equipment   Resistance Training Performed Yes   VAD Patient? No     Pain Assessment   Currently in Pain? No/denies         History  Smoking Status  . Never Smoker  Smokeless Tobacco  . Never Used    Goals Met:  Proper associated with RPD/PD & O2 Sat Exercise tolerated well No report of cardiac concerns or symptoms Strength training completed today  Goals Unmet:  Not Applicable  Comments: Pt able to follow exercise prescription today without complaint.  Will continue to monitor for progression.  His weight was up two pounds today and he was encouraged to contact his doctor.  He was complaining of increased shortness of breath and swelling.  He says that his shortness of breath was not new.  Dr. Emily Filbert is Medical Director for [place] and [facility].

## 2016-04-27 ENCOUNTER — Encounter: Payer: Non-veteran care | Admitting: Respiratory Therapy

## 2016-04-27 DIAGNOSIS — Z951 Presence of aortocoronary bypass graft: Secondary | ICD-10-CM

## 2016-04-27 LAB — GLUCOSE, CAPILLARY
GLUCOSE-CAPILLARY: 101 mg/dL — AB (ref 65–99)
Glucose-Capillary: 88 mg/dL (ref 65–99)

## 2016-04-27 NOTE — Progress Notes (Signed)
Daily Session Note ° °Patient Details  °Name: Scott E Saperstein Sr. °MRN: [id number] °Date of Birth: 10/26/1945 °Referring Provider:   °  Cardiac Rehab from 04/12/2016 in [facility] Cardiac and Pulmonary Rehab  °Referring Provider  Hinderliter, Alan MD  °  ° ° °Encounter Date: 04/27/2016 ° °Check In: °  °  °Session Check In - 04/27/16 0837   °  ° Check-In  ° Location [facility]-Cardiac & Pulmonary Re[facility]  ° Staff Present Jessica Hawkins, MA, ACSM RCEP, Exercise Physiologist;Susanne Bice, RN, BSN, CCRP;Laureen Brown, BS, RRT, Respiratory Therapist  ° Supervising physician immediately available to respond to emergencies See telemetry face sheet for immediately available ER MD  ° Medication changes reported     No  ° Fall or balance concerns reported    No  ° Warm-up and Cool-down Performed on first and last piece of equipment  ° Resistance Training Performed Yes  ° VAD Patient? No  °  ° Pain Assessment  ° Currently in Pain? No/denies  ° Multiple Pain Sites No  °  ° ° ° ° ° °History  °Smoking Status  °• Never Smoker  °Smokeless Tobacco  °• Never Used  ° ° °Goals Met:  °Proper associated with RPD/PD & O2 Sat °Independence with exercise equipment °Exercise tolerated well °No report of cardiac concerns or symptoms °Strength training completed today ° °Goals Unmet:  °Not Applicable ° °Comments: Pt able to follow exercise prescription today without complaint.  Will continue to monitor for progression. ° ° °Dr. Mark Miller is Medical Director for [facility] and [facility]. °

## 2016-04-29 ENCOUNTER — Telehealth: Payer: Self-pay | Admitting: *Deleted

## 2016-04-29 NOTE — Telephone Encounter (Signed)
Scott Acevedo called out today saying that he had to take his wife to the doctor today.

## 2016-05-06 DIAGNOSIS — Z951 Presence of aortocoronary bypass graft: Secondary | ICD-10-CM

## 2016-05-06 NOTE — Progress Notes (Signed)
Daily Session Note  Patient Details  Name: GLENN GULLICKSON Sr. MRN: [id number] Date of Birth: 12-22-1945 Referring Provider:     Cardiac Rehab from 04/12/2016 in [facility] Cardiac and Pulmonary Rehab  Referring Provider  Frankey Poot MD      Encounter Date: 05/06/2016  Check In:     Session Check In - 05/06/16 0848      Check-In   Location [facility]-Cardiac & Pulmonary Rehab   Staff Present Alberteen Sam, MA, ACSM RCEP, Exercise Physiologist;Amanda Oletta Darter, BA, ACSM CEP, Exercise Physiologist;Other  Darel Hong RN   Supervising physician immediately available to respond to emergencies See telemetry face sheet for immediately available ER MD   Medication changes reported     No   Fall or balance concerns reported    No   Warm-up and Cool-down Performed on first and last piece of equipment   Resistance Training Performed Yes   VAD Patient? No     Pain Assessment   Currently in Pain? No/denies   Multiple Pain Sites No         History  Smoking Status  . Never Smoker  Smokeless Tobacco  . Never Used    Goals Met:  Independence with exercise equipment Exercise tolerated well Personal goals reviewed No report of cardiac concerns or symptoms Strength training completed today  Goals Unmet:  Not Applicable  Comments: Pt able to follow exercise prescription today without complaint.  Will continue to monitor for progression.  See ITP for goals reviewed Reviewed home exercise with pt today.  Pt plans to walk at home for exercise.  Reviewed THR, pulse, RPE, sign and symptoms, and when to call 911 or MD.  Also discussed weather considerations and indoor options.  Pt voiced understanding.    Dr. Emily Filbert is Medical Director for [place] and [facility].

## 2016-05-11 ENCOUNTER — Encounter: Payer: Non-veteran care | Admitting: Respiratory Therapy

## 2016-05-11 DIAGNOSIS — Z951 Presence of aortocoronary bypass graft: Secondary | ICD-10-CM

## 2016-05-11 NOTE — Progress Notes (Signed)
Daily Session Note  Patient Details  Name: Scott BAUMGARTEN Sr. MRN: [id number] Date of Birth: 03/15/1945 Referring Provider:     Cardiac Rehab from 04/12/2016 in [facility] Cardiac and Pulmonary Rehab  Referring Provider  Frankey Poot MD      Encounter Date: 05/11/2016  Check In:     Session Check In - 05/11/16 0948      Check-In   Location [facility]-Cardiac & Pulmonary Rehab   Staff Present Alberteen Sam, MA, ACSM RCEP, Exercise Physiologist;Susanne Bice, RN, BSN, CCRP;Laureen Owens Shark, BS, RRT, Respiratory Therapist   Supervising physician immediately available to respond to emergencies See telemetry face sheet for immediately available ER MD   Medication changes reported     No   Fall or balance concerns reported    No   Warm-up and Cool-down Performed on first and last piece of equipment   Resistance Training Performed Yes   VAD Patient? No     Pain Assessment   Currently in Pain? No/denies   Multiple Pain Sites No         History  Smoking Status  . Never Smoker  Smokeless Tobacco  . Never Used    Goals Met:  Independence with exercise equipment Exercise tolerated well No report of cardiac concerns or symptoms Strength training completed today  Goals Unmet:  Not Applicable  Comments:  Pt able to follow exercise prescription today without complaint.  Will continue to monitor for progression.  He talked to his doctor last week about his weight gain.  They did not feel that it was heart failure, but rather that he needed to control his eating and portion sizes better.   Dr. Emily Filbert is Medical Director for [place] and [facility].

## 2016-05-12 ENCOUNTER — Encounter: Payer: Self-pay | Admitting: *Deleted

## 2016-05-12 DIAGNOSIS — Z951 Presence of aortocoronary bypass graft: Secondary | ICD-10-CM

## 2016-05-12 NOTE — Progress Notes (Signed)
Cardiac Individual Treatment Plan  Patient Details  Name: Scott ISHAM Sr. MRN: [id number] Date of Birth: November 28, 1945 Referring Provider:     Cardiac Rehab from 04/12/2016 in [facility] Cardiac and Pulmonary Rehab  Referring Provider  Hinderliter, Antony Haste MD      Initial Encounter Date:    Cardiac Rehab from 04/12/2016 in [facility] Cardiac and Pulmonary Rehab  Date  04/12/16  Referring Provider  Hinderliter, Antony Haste MD      Visit Diagnosis: S/P CABG (coronary artery bypass graft)  Patient's Home Medications on Admission:  Current Outpatient Prescriptions:  .  acetaminophen (TYLENOL) 500 MG tablet, Take 1,000 mg by mouth 3 (three) times daily. , Disp: , Rfl:  .  albuterol (PROVENTIL HFA;VENTOLIN HFA) 108 (90 Base) MCG/ACT inhaler, Inhale 2 puffs into the lungs every 6 (six) hours as needed for wheezing., Disp: , Rfl:  .  amiodarone (PACERONE) 200 MG tablet, Take 200 mg by mouth daily., Disp: , Rfl:  .  apixaban (ELIQUIS) 2.5 MG TABS tablet, Take 2.5 mg by mouth 2 (two) times daily., Disp: , Rfl:  .  aspirin EC 81 MG tablet, Take 81 mg by mouth., Disp: , Rfl:  .  atorvastatin (LIPITOR) 80 MG tablet, Take 80 mg by mouth daily., Disp: , Rfl:  .  Calcium Carbonate-Vitamin D 600-400 MG-UNIT tablet, Take 1 tablet by mouth 2 (two) times daily with a meal., Disp: , Rfl:  .  cephALEXin (KEFLEX) 500 MG capsule, Take 1 capsule (500 mg total) by mouth 4 (four) times daily., Disp: 40 capsule, Rfl: 0 .  cetirizine (ZYRTEC) 10 MG tablet, Take 10 mg by mouth daily., Disp: , Rfl:  .  FLUoxetine (PROZAC) 20 MG tablet, Take 80 mg by mouth daily., Disp: , Rfl:  .  gabapentin (NEURONTIN) 300 MG capsule, Take 600 mg by mouth 3 (three) times daily., Disp: , Rfl:  .  glucosamine-chondroitin 500-400 MG tablet, Take 1 tablet by mouth 2 (two) times daily., Disp: , Rfl:  .  hydrocortisone 2.5 % cream, Apply 1 application topically 2 (two) times daily., Disp: , Rfl:  .  lisinopril (PRINIVIL,ZESTRIL) 2.5 MG tablet, Take 2.5 mg by  mouth daily., Disp: , Rfl:  .  magnesium oxide (MAG-OX) 400 MG tablet, Take 400 mg by mouth 2 (two) times daily., Disp: , Rfl:  .  metFORMIN (GLUCOPHAGE) 500 MG tablet, Take 500 mg by mouth 2 (two) times daily with a meal., Disp: , Rfl:  .  metoprolol succinate (TOPROL-XL) 25 MG 24 hr tablet, Take 12.5 mg by mouth daily. , Disp: , Rfl:  .  mirtazapine (REMERON) 15 MG tablet, Take 45 mg by mouth at bedtime., Disp: , Rfl:  .  pramipexole (MIRAPEX) 1 MG tablet, Take 0.5 mg by mouth every evening., Disp: , Rfl:  .  prochlorperazine (COMPAZINE) 5 MG tablet, Take 10 mg by mouth every 8 (eight) hours as needed for nausea., Disp: , Rfl:  .  ranitidine (ZANTAC) 150 MG tablet, Take 150 mg by mouth 2 (two) times daily., Disp: , Rfl:   Past Medical History: Past Medical History:  Diagnosis Date  . COPD (chronic obstructive pulmonary disease) ([place])   . Diabetes mellitus without complication ([place])   . Hypertension     Tobacco Use: History  Smoking Status  . Never Smoker  Smokeless Tobacco  . Never Used    Labs: Recent Review Flowsheet Data    There is no flowsheet data to display.       Exercise Target Goals:  Exercise Program Goal: Individual exercise prescription set with THRR, safety & activity barriers. Participant demonstrates ability to understand and report RPE using BORG scale, to self-measure pulse accurately, and to acknowledge the importance of the exercise prescription.  Exercise Prescription Goal: Starting with aerobic activity 30 plus minutes a day, 3 days per week for initial exercise prescription. Provide home exercise prescription and guidelines that participant acknowledges understanding prior to discharge.  Activity Barriers & Risk Stratification:     Activity Barriers & Cardiac Risk Stratification - 04/12/16 1308      Activity Barriers & Cardiac Risk Stratification   Activity Barriers Arthritis;Back Problems;Joint Problems;Deconditioning;Muscular  Weakness;Shortness of Breath;Assistive Device;History of Falls;Balance Concerns  fractured hip in [employer]   Cardiac Risk Stratification High      6 Minute Walk:     6 Minute Walk    Row Name 04/12/16 1532         6 Minute Walk   Phase Initial     Distance 1130 feet     Walk Time 6 minutes     # of Rest Breaks 0     MPH 2.14     METS 2.47     RPE 15     Perceived Dyspnea  4     VO2 Peak 8.66     Symptoms Yes (comment)     Comments SOB     Resting HR 77 bpm     Resting BP 180/80  recheck 134/74     Max Ex. HR 93 bpm     Max Ex. BP 186/74     2 Minute Post BP 146/74        Oxygen Initial Assessment:   Oxygen Re-Evaluation:   Oxygen Discharge (Final Oxygen Re-Evaluation):   Initial Exercise Prescription:     Initial Exercise Prescription - 04/12/16 1500      Date of Initial Exercise RX and Referring Provider   Date 04/12/16   Referring Provider Hinderliter, Alan MD     Treadmill   MPH 1.8   Grade 0.5   Minutes 15   METs 2.5     NuStep   Level 2   SPM 80   Minutes 15   METs 2     Biostep-RELP   Level 3   SPM 50   Minutes 15   METs 2     Prescription Details   Frequency (times per week) 2   Duration Progress to 45 minutes of aerobic exercise without signs/symptoms of physical distress     Intensity   THRR 40-80% of Max Heartrate 106-135   Ratings of Perceived Exertion 11-13   Perceived Dyspnea 0-4     Progression   Progression Continue to progress workloads to maintain intensity without signs/symptoms of physical distress.     Resistance Training   Training Prescription Yes   Weight 2 lbs   Reps 10-15      Perform Capillary Blood Glucose checks as needed.  Exercise Prescription Changes:     Exercise Prescription Changes    Row Name 04/22/16 1400 05/07/16 1500           Response to Exercise   Blood Pressure (Admit) 126/80 150/82      Blood Pressure (Exercise) 128/62 152/74      Blood Pressure (Exit) 114/56 128/74       Heart Rate (Admit) 74 bpm 71 bpm      Heart Rate (Exercise) 103 bpm 99 bpm      Heart Rate (Exit) 94  bpm 77 bpm      Rating of Perceived Exertion (Exercise) 13 13      Symptoms shortness of breath shortness of breath on the treadmill      Duration Progress to 45 minutes of aerobic exercise without signs/symptoms of physical distress Progress to 45 minutes of aerobic exercise without signs/symptoms of physical distress      Intensity THRR unchanged THRR unchanged        Progression   Progression Continue to progress workloads to maintain intensity without signs/symptoms of physical distress. Continue to progress workloads to maintain intensity without signs/symptoms of physical distress.      Average METs 2.7 2.3        Resistance Training   Training Prescription Yes Yes      Weight 3 lbs 3 lbs      Reps 10-15 10-15        Interval Training   Interval Training No No        Treadmill   MPH 1.8 1.8      Grade 0.5 0.5      Minutes 15 15      METs 2.5 2.5        NuStep   Level 1 2      SPM 80 100      Minutes 15 15      METs 3.6 2.5        Biostep-RELP   Level 1 1      SPM 50 50      Minutes 15 15      METs 2 2        Home Exercise Plan   Plans to continue exercise at  - Home (comment)  walking      Frequency  - Add 2 additional days to program exercise sessions.      Initial Home Exercises Provided  - 05/06/16         Exercise Comments:     Exercise Comments    Row Name 04/20/16 5498 04/22/16 1433         Exercise Comments First full day of exercise!  Patient was oriented to gym and equipment including functions, settings, policies, and procedures.  Patient's individual exercise prescription and treatment plan were reviewed.  All starting workloads were established based on the results of the 6 minute walk test done at initial orientation visit.  The plan for exercise progression was also introduced and progression will be customized based on patient's performance  and goals. His weight was up two pounds today and he was encouraged to contact his doctor.  He was complaining of increased shortness of breath and swelling.  He says that his shortness of breath was not new.         Exercise Goals and Review:     Exercise Goals    Row Name 04/12/16 1536             Exercise Goals   Increase Physical Activity Yes       Intervention Provide advice, education, support and counseling about physical activity/exercise needs.;Develop an individualized exercise prescription for aerobic and resistive training based on initial evaluation findings, risk stratification, comorbidities and participant's personal goals.       Expected Outcomes Achievement of increased cardiorespiratory fitness and enhanced flexibility, muscular endurance and strength shown through measurements of functional capacity and personal statement of participant.       Increase Strength and Stamina Yes  more energy and strength  Intervention Provide advice, education, support and counseling about physical activity/exercise needs.;Develop an individualized exercise prescription for aerobic and resistive training based on initial evaluation findings, risk stratification, comorbidities and participant's personal goals.       Expected Outcomes Achievement of increased cardiorespiratory fitness and enhanced flexibility, muscular endurance and strength shown through measurements of functional capacity and personal statement of participant.          Exercise Goals Re-Evaluation :     Exercise Goals Re-Evaluation    Row Name 04/22/16 1431 05/06/16 1042 05/07/16 1550         Exercise Goal Re-Evaluation   Exercise Goals Review Increase Physical Activity;Increase Strenth and Stamina Increase Physical Activity;Increase Strenth and Stamina Increase Physical Activity;Increase Strenth and Stamina     Comments Scott Acevedo has completed two full days of exercise.  He is off to a good start.  We will  continue to monitor his progression. Reviewed home exercise with pt today.  Pt plans to walk at home for exercise.  Reviewed THR, pulse, RPE, sign and symptoms, and when to call 911 or MD.  Also discussed weather considerations and indoor options.  Pt voiced understanding  He is already doing two days a week at home.  He has been able to tell a difference in how he is feeling as his strength has started to improve.  We will continue to work with him. Scott Acevedo has been doing well in rehab.  We have discussed using this time for himself and letting us know how exercise really is going for him.  He has SOB on the treadmill and usually doesn't want [place] to know about it.     Expected Outcomes Short and Long: Dahl will attend classes to work on strength and stamina. Short: Continue to exercise two days a week at home consistently.  Long: Add in thrid day at home and continue to work on increasing his stamina. Short: Add in his home exercise consistently.  Long: Continue to come to class to work on strength and stamina        Discharge Exercise Prescription (Final Exercise Prescription Changes):     Exercise Prescription Changes - 05/07/16 1500      Response to Exercise   Blood Pressure (Admit) 150/82   Blood Pressure (Exercise) 152/74   Blood Pressure (Exit) 128/74   Heart Rate (Admit) 71 bpm   Heart Rate (Exercise) 99 bpm   Heart Rate (Exit) 77 bpm   Rating of Perceived Exertion (Exercise) 13   Symptoms shortness of breath on the treadmill   Duration Progress to 45 minutes of aerobic exercise without signs/symptoms of physical distress   Intensity THRR unchanged     Progression   Progression Continue to progress workloads to maintain intensity without signs/symptoms of physical distress.   Average METs 2.3     Resistance Training   Training Prescription Yes   Weight 3 lbs   Reps 10-15     Interval Training   Interval Training No     Treadmill   MPH 1.8   Grade 0.5   Minutes 15   METs  2.5     NuStep   Level 2   SPM 100   Minutes 15   METs 2.5     Biostep-RELP   Level 1   SPM 50   Minutes 15   METs 2     Home Exercise Plan   Plans to continue exercise at Home (comment)  walking   Frequency Add 2  additional days to program exercise sessions.   Initial Home Exercises Provided 05/06/16      Nutrition:  Target Goals: Understanding of nutrition guidelines, daily intake of sodium '1500mg'$ , cholesterol '200mg'$ , calories 30% from fat and 7% or less from saturated fats, daily to have 5 or more servings of fruits and vegetables.  Biometrics:     Pre Biometrics - 04/12/16 1536      Pre Biometrics   Height '6\' 1"'$  (1.854 m)   Weight 260 lb 12.8 oz (118.3 kg)   Waist Circumference 47 inches   Hip Circumference 47 inches   Waist to Hip Ratio 1 %   BMI (Calculated) 34.5   Single Leg Stand 9.28 seconds       Nutrition Therapy Plan and Nutrition Goals:     Nutrition Therapy & Goals - 05/11/16 1108      Nutrition Therapy   Diet Instructed patient accompanied by his wife on a meal plan based on 2000 calories including heart healthy dietary guidelines as well as diabetes dietary guidelines.   Drug/Food Interactions Statins/Certain Fruits   Protein (specify units) 8 ounces   Fiber 30 grams   Whole Grain Foods 3 servings   Saturated Fats 13 max. grams   Fruits and Vegetables 5 servings/day   Sodium 2000 grams  '1500mg'$  idfeal     Personal Nutrition Goals   Nutrition Goal Increase fruits and vegetables to at least 5 servings daily.   Personal Goal #2 Read labels for saturated fat, trans fat and sodium   Personal Goal #3 Rinse canned vegetables; use fresh or frozen whenever possible.   Personal Goal #4 Balance meals with protein, 3-4 servings of carbohydrate and non-starchy vegetables. Refer to "Diabetes Plate" hand-out and "Planning a Balanced Meal" handout.     Intervention Plan   Intervention Prescribe, educate and counsel regarding individualized specific  dietary modifications aiming towards targeted core components such as weight, hypertension, lipid management, diabetes, heart failure and other comorbidities.;Nutrition handout(s) given to patient.   Expected Outcomes Short Term Goal: Understand basic principles of dietary content, such as calories, fat, sodium, cholesterol and nutrients.;Short Term Goal: A plan has been developed with personal nutrition goals set during dietitian appointment.;Long Term Goal: Adherence to prescribed nutrition plan.      Nutrition Discharge: Rate Your Plate Scores:   Nutrition Goals Re-Evaluation:     Nutrition Goals Re-Evaluation    Row Name 05/06/16 1054             Goals   Comment Scott Acevedo has started to work on his diet some.  He is trying to stay away from white grains.  He has an appointment to meet with the dietician on Tuesday April 24.       Expected Outcome Set nutrition goals.          Nutrition Goals Discharge (Final Nutrition Goals Re-Evaluation):     Nutrition Goals Re-Evaluation - 05/06/16 1054      Goals   Comment Scott Acevedo has started to work on his diet some.  He is trying to stay away from white grains.  He has an appointment to meet with the dietician on Tuesday April 24.   Expected Outcome Set nutrition goals.      Psychosocial: Target Goals: Acknowledge presence or absence of significant depression and/or stress, maximize coping skills, provide positive support system. Participant is able to verbalize types and ability to use techniques and skills needed for reducing stress and depression.   Initial Review & Psychosocial Screening:  Initial Psych Review & Screening - 04/12/16 1311      Initial Review   Current issues with Current Stress Concerns   Source of Stress Concerns Chronic Illness;Occupation     Family Dynamics   [place]? Yes     Barriers   Psychosocial barriers to participate in program The patient should benefit from training in stress  management and relaxation.     Screening Interventions   Interventions Yes;Encouraged to exercise;Program counselor consult   Expected Outcomes Short Term goal: Utilizing psychosocial counselor, staff and physician to assist with identification of specific Stressors or current issues interfering with healing process. Setting desired goal for each stressor or current issue identified.;Long Term Goal: Stressors or current issues are controlled or eliminated.;Short Term goal: Identification and review with participant of any Quality of Life or Depression concerns found by scoring the questionnaire.      Quality of Life Scores:    PHQ-9: Recent Review Flowsheet Data    Depression screen [facility] 2/9 04/12/2016   Decreased Interest 0   Down, Depressed, Hopeless 2   PHQ - 2 Score 2   Altered sleeping 2   Tired, decreased energy 3   Change in appetite 0   Feeling bad or failure about yourself  1   Trouble concentrating 2   Moving slowly or fidgety/restless 0   Suicidal thoughts 1   PHQ-9 Score 11   Difficult doing work/chores Somewhat difficult     Interpretation of Total Score  Total Score Depression Severity:  1-4 = Minimal depression, 5-9 = Mild depression, 10-14 = Moderate depression, 15-19 = Moderately severe depression, 20-27 = Severe depression   Psychosocial Evaluation and Intervention:     Psychosocial Evaluation - 05/06/16 1049      Psychosocial Evaluation & Interventions   Interventions Stress management education;Encouraged to exercise with the program and follow exercise prescription   Comments Donavyn has not yet met with Juliann Pulse.  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  He wears his pastoral care badges every day as most days he is on the go.  Tuesday was a rough day with multiple appointments in different locations and his son was having surgery as well.  He has admitted that his doctor has encouraged him to slow down some.  He is starting to take that to heart and rested most  of the day yesterday.  He finds his stress relief in his stamp collection.  He would like to be able to get off some of his meds, breathe better, and lose weight while in the program.  We will continue to check in with him to see how he is handling his stressors.   Expected Outcomes Short: Take more time for self and exercise.  Long: Continue to focus on self care as well.   Continue Psychosocial Services  Follow up required by counselor      Psychosocial Re-Evaluation:   Psychosocial Discharge (Final Psychosocial Re-Evaluation):   Vocational Rehabilitation: Provide vocational rehab assistance to qualifying candidates.   Vocational Rehab Evaluation & Intervention:     Vocational Rehab - 04/12/16 1255      Initial Vocational Rehab Evaluation & Intervention   Assessment shows need for Vocational Rehabilitation No      Education: Education Goals: Education classes will be provided on a weekly basis, covering required topics. Participant will state understanding/return demonstration of topics presented.  Learning Barriers/Preferences:     Learning Barriers/Preferences - 04/12/16 1255      Learning  Barriers/Preferences   Learning Barriers Sight;Hearing   Learning Preferences None      Education Topics: General Nutrition Guidelines/Fats and Fiber: -Group instruction provided by verbal, written material, models and posters to present the general guidelines for heart healthy nutrition. Gives an explanation and review of dietary fats and fiber.   Controlling Sodium/Reading Food Labels: -Group verbal and written material supporting the discussion of sodium use in heart healthy nutrition. Review and explanation with models, verbal and written materials for utilization of the food label.   Exercise Physiology & Risk Factors: - Group verbal and written instruction with models to review the exercise physiology of the cardiovascular system and associated critical values. Details  cardiovascular disease risk factors and the goals associated with each risk factor.   Aerobic Exercise & Resistance Training: - Gives group verbal and written discussion on the health impact of inactivity. On the components of aerobic and resistive training programs and the benefits of this training and how to safely progress through these programs.   Flexibility, Balance, General Exercise Guidelines: - Provides group verbal and written instruction on the benefits of flexibility and balance training programs. Provides general exercise guidelines with specific guidelines to those with heart or lung disease. Demonstration and skill practice provided.   Stress Management: - Provides group verbal and written instruction about the health risks of elevated stress, cause of high stress, and healthy ways to reduce stress.   Cardiac Rehab from 05/11/2016 in [facility] Cardiac and Pulmonary Rehab  Date  04/20/16  Educator  [facility]  Instruction Review Code  2- meets goals/outcomes      Depression: - Provides group verbal and written instruction on the correlation between heart/lung disease and depressed mood, treatment options, and the stigmas associated with seeking treatment.   Anatomy & Physiology of the Heart: - Group verbal and written instruction and models provide basic cardiac anatomy and physiology, with the coronary electrical and arterial systems. Review of: AMI, Angina, Valve disease, Heart Failure, Cardiac Arrhythmia, Pacemakers, and the ICD.   Cardiac Rehab from 05/11/2016 in [facility] Cardiac and Pulmonary Rehab  Date  04/22/16  Educator  SB  Instruction Review Code  2- meets goals/outcomes      Cardiac Procedures: - Group verbal and written instruction and models to describe the testing methods done to diagnose heart disease. Reviews the outcomes of the test results. Describes the treatment choices: Medical Management, Angioplasty, or Coronary Bypass Surgery.   [facility]11[facility]2016 [facility]iac [facility]  Date  04/27/16  Educator  SB  Instruction Review Code  2- meets goals/outcomes      Cardiac Medications: - Group verbal and written instruction to review commonly prescribed medications for heart disease. Reviews the medication, class of the drug, and side effects. Includes the steps to properly store meds and maintain the prescription regimen.   Cardiac Rehab from 05/11/2016 in [facility] Cardiac and Pulmonary Rehab  Date  05/06/16  Educator  KS  Instruction Review Code  2- meets goals/outcomes      Go Sex-Intimacy & Heart Disease, Get SMART - Goal Setting: - Group verbal and written instruction through game format to discuss heart disease and the return to sexual intimacy. Provides group verbal and written material to discuss and apply goal setting through the application of the S.M.A.R.T. Method.   Cardiac Rehab from 05/11/2016 in [facility] Cardiac and Pulmonary Rehab  Date  04/27/16  Educator  SB  Instruction Review Code  2- meets goals/outcomes  Other Matters of the Heart: - Provides group verbal, written materials and models to describe Heart Failure, Angina, Valve Disease, and Diabetes in the realm of heart disease. Includes description of the disease process and treatment options available to the cardiac patient.   Cardiac Rehab from 05/11/2016 in [facility] Cardiac and Pulmonary Rehab  Date  04/22/16  Educator  SB  Instruction Review Code  2- meets goals/outcomes      Exercise & Equipment Safety: - Individual verbal instruction and demonstration of equipment use and safety with use of the equipment.   Cardiac Rehab from 05/11/2016 in [facility] Cardiac and Pulmonary Rehab  Date  04/12/16  Educator  C. EnterkinRN  Instruction Review Code  1- partially meets, needs review/practice      Infection Prevention: - Provides verbal and written material to individual with discussion of infection control including proper hand washing and proper equipment cleaning  during exercise session.   Cardiac Rehab from 05/11/2016 in [facility] Cardiac and Pulmonary Rehab  Date  04/12/16  Educator  C. Enterkin,RN  Instruction Review Code  2- meets goals/outcomes      Falls Prevention: - Provides verbal and written material to individual with discussion of falls prevention and safety.   Cardiac Rehab from 05/11/2016 in [facility] Cardiac and Pulmonary Rehab  Date  04/12/16  Educator  C. Dillard  Instruction Review Code  1- partially meets, needs review/practice      Diabetes: - Individual verbal and written instruction to review signs/symptoms of diabetes, desired ranges of glucose level fasting, after meals and with exercise. Advice that pre and post exercise glucose checks will be done for 3 sessions at entry of program.   Cardiac Rehab from 05/11/2016 in [facility] Cardiac and Pulmonary Rehab  Date  04/12/16  Educator  C. [place]  Instruction Review Code  1- partially meets, needs review/practice       Knowledge Questionnaire Score:     Knowledge Questionnaire Score - 04/12/16 1255      Knowledge Questionnaire Score   Pre Score 18/28      Core Components/Risk Factors/Patient Goals at Admission:     Personal Goals and Risk Factors at Admission - 04/12/16 1308      Core Components/Risk Factors/Patient Goals on Admission    Weight Management Yes;Obesity;Weight Maintenance   Intervention Weight Management: Develop a combined nutrition and exercise program designed to reach desired caloric intake, while maintaining appropriate intake of nutrient and fiber, sodium and fats, and appropriate energy expenditure required for the weight goal.;Weight Management: Provide education and appropriate resources to help participant work on and attain dietary goals.;Weight Management/Obesity: Establish reasonable short term and long term weight goals.;Obesity: Provide education and appropriate resources to help participant work on and attain dietary goals.   Admit Weight 260  lb 12.8 oz (118.3 kg)   Goal Weight: Short Term 230 lb (104.3 kg)   Goal Weight: Long Term 185 lb (83.9 kg)   Expected Outcomes Short Term: Continue to assess and modify interventions until short term weight is achieved;Weight Loss: Understanding of general recommendations for a balanced deficit meal plan, which promotes 1-2 lb weight loss per week and includes a negative energy balance of [phone] kcal/d;Understanding recommendations for meals to include 15-35% energy as protein, 25-35% energy from fat, 35-60% energy from carbohydrates, less than '200mg'$  of dietary cholesterol, 20-35 gm of total fiber daily;Understanding of distribution of calorie intake throughout the day with the consumption of 4-5 meals/snacks   Improve shortness of breath with ADL's Yes  Intervention Provide education, individualized exercise plan and daily activity instruction to help decrease symptoms of SOB with activities of daily living.   Expected Outcomes Short Term: Achieves a reduction of symptoms when performing activities of daily living.   Develop more efficient breathing techniques such as purse lipped breathing and diaphragmatic breathing; and practicing self-pacing with activity Yes   Intervention Provide education, demonstration and support about specific breathing techniuqes utilized for more efficient breathing. Include techniques such as pursed lipped breathing, diaphragmatic breathing and self-pacing activity.   Expected Outcomes Short Term: Participant will be able to demonstrate and use breathing techniques as needed throughout daily activities.   Diabetes Yes   Intervention Provide education about signs/symptoms and action to take for hypo/hyperglycemia.;Provide education about proper nutrition, including hydration, and aerobic/resistive exercise prescription along with prescribed medications to achieve blood glucose in normal ranges: Fasting glucose 65-99 mg/dL   Expected Outcomes Short Term: Participant  verbalizes understanding of the signs/symptoms and immediate care of hyper/hypoglycemia, proper foot care and importance of medication, aerobic/resistive exercise and nutrition plan for blood glucose control.;Long Term: Attainment of HbA1C < 7%.   Hypertension Yes   Intervention Monitor prescription use compliance.;Provide education on lifestyle modifcations including regular physical activity/exercise, weight management, moderate sodium restriction and increased consumption of fresh fruit, vegetables, and low fat dairy, alcohol moderation, and smoking cessation.   Expected Outcomes Short Term: Continued assessment and intervention until BP is < 140/86m HG in hypertensive participants. < 130/880mHG in hypertensive participants with diabetes, heart failure or chronic kidney disease.;Long Term: Maintenance of blood pressure at goal levels.   Lipids Yes   Intervention Provide education and support for participant on nutrition & aerobic/resistive exercise along with prescribed medications to achieve LDL '70mg'$ , HDL >'40mg'$ .   Expected Outcomes Short Term: Participant states understanding of desired cholesterol values and is compliant with medications prescribed. Participant is following exercise prescription and nutrition guidelines.;Long Term: Cholesterol controlled with medications as prescribed, with individualized exercise RX and with personalized nutrition plan. Value goals: LDL < '70mg'$ , HDL > 40 mg.   Stress Yes   Intervention Offer individual and/or small group education and counseling on adjustment to heart disease, stress management and health-related lifestyle change. Teach and support self-help strategies.;Refer participants experiencing significant psychosocial distress to appropriate mental health specialists for further evaluation and treatment. When possible, include family members and significant others in education/counseling sessions.   Expected Outcomes Short Term: Participant demonstrates  changes in health-related behavior, relaxation and other stress management skills, ability to obtain effective social support, and compliance with psychotropic medications if prescribed.;Long Term: Emotional wellbeing is indicated by absence of clinically significant psychosocial distress or social isolation.      Core Components/Risk Factors/Patient Goals Review:      Goals and Risk Factor Review    Row Name 05/06/16 1043 05/11/16 1036           Core Components/Risk Factors/Patient Goals Review   Personal Goals Review Weight Management/Obesity;Develop more efficient breathing techniques such as purse lipped breathing and diaphragmatic breathing and practicing self-pacing with activity.;Improve shortness of breath with ADL's;Diabetes;Hypertension;Lipids Weight Management/Obesity      Review JaFelipeas been doing well in rehab.  He is still struggling with weight gain and shortness of breath.  [facility] [place] [facility] [place] [place] to keep exercising and drink plenty of water.  Since his weight was up again today and his breathing continues to not improve, I encouraged him to call them again to let  them know he has gained 4 lbs in a week.  We did review how to do pursed lip and diaphragmatic breathing to help with breath control during activity.  His blood sugars and statins have been doing well and stay stable. However, his blood pressures continue to be all over the map, no consistentsy.  He says that he is checking it at home and keeping a log which I encouraged him to show to his doctor at his next visit.   He talked to his doctor last week about his weight gain.  They did not feel that it was heart failure, but rather that he needed to control his eating and portion sizes better.      Expected Outcomes Short: Call doctor about shortness of breath and weight gain.  Long: Continue to work on weight loss, risk factors, and pursed lip breathing.   Short: Has appt with dietician today.  Long:  Work on improved diet and control.         Core Components/Risk Factors/Patient Goals at Discharge (Final Review):      Goals and Risk Factor Review - 05/11/16 1036      Core Components/Risk Factors/Patient Goals Review   Personal Goals Review Weight Management/Obesity   Review He talked to his doctor last week about his weight gain.  They did not feel that it was heart failure, but rather that he needed to control his eating and portion sizes better.   Expected Outcomes Short: Has appt with dietician today.  Long: Work on improved diet and control.      ITP Comments:     ITP Comments    Row Name 04/12/16 1305 04/14/16 0611 05/12/16 0552       ITP Comments ITP Created during Medical Review, Documenation of [facility] [employer]'s Admin Notes which will be scanned.  30 day review. Continue with ITP unless directed changes per Medical Director review  New to program 30 day review. Continue with ITP unless directed changes per Medical Director review        Comments:

## 2016-05-13 DIAGNOSIS — Z951 Presence of aortocoronary bypass graft: Secondary | ICD-10-CM | POA: Diagnosis not present

## 2016-05-13 NOTE — Progress Notes (Signed)
Daily Session Note  Patient Details  Name: SHYQUAN STALLBAUMER Sr. MRN: [id number] Date of Birth: 1945-01-22 Referring Provider:     Cardiac Rehab from 04/12/2016 in [facility] Cardiac and Pulmonary Rehab  Referring Provider  Frankey Poot MD      Encounter Date: 05/13/2016  Check In:     Session Check In - 05/13/16 1020      Check-In   Location [facility]-Cardiac & Pulmonary Rehab   Staff Present Alberteen Sam, MA, ACSM RCEP, Exercise Physiologist;Caroline Matters Oletta Darter, BA, ACSM CEP, Exercise Physiologist;Other  Darel Hong RN   Supervising physician immediately available to respond to emergencies [facility] immediately available ER MD   Medication changes reported     No   Fall or balance concerns reported    No   Warm-up and Cool-down Performed on first and last piece of equipment   Resistance Training Performed Yes   VAD Patient? No     Pain Assessment   Currently in Pain? No/denies         History  Smoking Status  . Never Smoker  Smokeless Tobacco  . Never Used    Goals Met:  Independence with exercise equipment Exercise tolerated well No report of cardiac concerns or symptoms Strength training completed today  Goals Unmet:  Not Applicable  Comments: Pt able to follow exercise prescription today without complaint.  Will continue to monitor for progression.    Dr. Emily Filbert is Medical Director for [place] and [facility].

## 2016-05-18 ENCOUNTER — Encounter: Payer: Self-pay | Admitting: Respiratory Therapy

## 2016-05-18 ENCOUNTER — Encounter: Payer: No Typology Code available for payment source | Attending: Internal Medicine

## 2016-05-18 ENCOUNTER — Telehealth: Payer: Self-pay | Admitting: Respiratory Therapy

## 2016-05-18 DIAGNOSIS — I2510 Atherosclerotic heart disease of native coronary artery without angina pectoris: Secondary | ICD-10-CM | POA: Insufficient documentation

## 2016-05-18 DIAGNOSIS — Z951 Presence of aortocoronary bypass graft: Secondary | ICD-10-CM | POA: Insufficient documentation

## 2016-05-18 NOTE — Telephone Encounter (Signed)
Scott Acevedo called out today due to another appointment.

## 2016-05-20 DIAGNOSIS — Z951 Presence of aortocoronary bypass graft: Secondary | ICD-10-CM

## 2016-05-20 DIAGNOSIS — I2510 Atherosclerotic heart disease of native coronary artery without angina pectoris: Secondary | ICD-10-CM | POA: Diagnosis not present

## 2016-05-20 NOTE — Progress Notes (Signed)
Daily Session Note  Patient Details  Name: ABOUBACAR MATSUO Sr. MRN: [id number] Date of Birth: January 25, 1945 Referring Provider:     Cardiac Rehab from 04/12/2016 in [facility] Cardiac and Pulmonary Rehab  Referring Provider  Frankey Poot MD      Encounter Date: 05/20/2016  Check In:     Session Check In - 05/20/16 0854      Check-In   Location [facility]-Cardiac & Pulmonary Rehab   Staff Present Alberteen Sam, MA, ACSM RCEP, Exercise Physiologist;Amanda Oletta Darter, BA, ACSM CEP, Exercise Physiologist;Other  Darel Hong RN   Supervising physician immediately available to respond to emergencies See telemetry face sheet for immediately available ER MD   Medication changes reported     No   Fall or balance concerns reported    No   Warm-up and Cool-down Performed on first and last piece of equipment   Resistance Training Performed Yes   VAD Patient? No     Pain Assessment   Currently in Pain? No/denies         History  Smoking Status  . Never Smoker  Smokeless Tobacco  . Never Used    Goals Met:  Independence with exercise equipment Exercise tolerated well No report of cardiac concerns or symptoms Strength training completed today  Goals Unmet:  Not Applicable  Comments: Pt able to follow exercise prescription today without complaint.  Will continue to monitor for progression.    Dr. Emily Filbert is Medical Director for [place] and [facility].

## 2016-05-25 ENCOUNTER — Encounter: Payer: No Typology Code available for payment source | Admitting: Respiratory Therapy

## 2016-05-25 DIAGNOSIS — Z951 Presence of aortocoronary bypass graft: Secondary | ICD-10-CM | POA: Diagnosis not present

## 2016-05-25 NOTE — Progress Notes (Signed)
Daily Session Note  Patient Details  Name: Scott KNOOP Sr. MRN: [id number] Date of Birth: 12/03/1945 Referring Provider:     Cardiac Rehab from 04/12/2016 in [facility] Cardiac and Pulmonary Rehab  Referring Provider  Frankey Poot MD      Encounter Date: 05/25/2016  Check In:     Session Check In - 05/25/16 0839      Check-In   Location [facility]-Cardiac & Pulmonary Rehab   Staff Present Alberteen Sam, MA, ACSM RCEP, Exercise Physiologist;Laureen Owens Shark, BS, RRT, Respiratory Therapist;Carroll Enterkin, RN, BSN   Supervising physician immediately available to respond to emergencies See telemetry face sheet for immediately available ER MD   Medication changes reported     No   Fall or balance concerns reported    No   Warm-up and Cool-down Performed on first and last piece of equipment   Resistance Training Performed Yes   VAD Patient? No     Pain Assessment   Currently in Pain? No/denies   Multiple Pain Sites No         History  Smoking Status   Never Smoker  Smokeless Tobacco   Never Used    Goals Met:  Proper associated with RPD/PD & O2 Sat Independence with exercise equipment Exercise tolerated well No report of cardiac concerns or symptoms Strength training completed today  Goals Unmet:  Not Applicable  Comments: Pt able to follow exercise prescription today without complaint.  Will continue to monitor for progression.   Dr. Emily Filbert is Medical Director for [place] and [facility].

## 2016-06-03 ENCOUNTER — Encounter: Payer: No Typology Code available for payment source | Admitting: *Deleted

## 2016-06-03 DIAGNOSIS — Z951 Presence of aortocoronary bypass graft: Secondary | ICD-10-CM | POA: Diagnosis not present

## 2016-06-03 NOTE — Progress Notes (Signed)
Daily Session Note  Patient Details  Name: Scott ENIS Sr. MRN: [id number] Date of Birth: 1945-06-20 Referring Provider:     Cardiac Rehab from 04/12/2016 in [facility] Cardiac and Pulmonary Rehab  Referring Provider  Frankey Poot MD      Encounter Date: 06/03/2016  Check In:     Session Check In - 06/03/16 0833      Check-In   Location [facility]-Cardiac & Pulmonary Rehab   Staff Present Renita Papa, RN BSN;Jessica Luan Pulling, MA, ACSM RCEP, Exercise Physiologist;Amanda Oletta Darter, BA, ACSM CEP, Exercise Physiologist;Krista Frederico Hamman, RN BSN   Supervising physician immediately available to respond to emergencies See telemetry face sheet for immediately available ER MD   Medication changes reported     No   Fall or balance concerns reported    No   Tobacco Cessation No Change   Warm-up and Cool-down Performed on first and last piece of equipment   Resistance Training Performed Yes   VAD Patient? No     Pain Assessment   Currently in Pain? No/denies   Multiple Pain Sites No           Exercise Prescription Changes - 06/02/16 1000      Response to Exercise   Blood Pressure (Admit) 126/76   Blood Pressure (Exercise) 122/69   Blood Pressure (Exit) 126/70   Heart Rate (Admit) 79 bpm   Heart Rate (Exercise) 103 bpm   Heart Rate (Exit) 74 bpm   Rating of Perceived Exertion (Exercise) 12   Symptoms shortness of breath on the treadmill   Duration Continue with 45 min of aerobic exercise without signs/symptoms of physical distress.   Intensity THRR unchanged     Progression   Progression Continue to progress workloads to maintain intensity without signs/symptoms of physical distress.   Average METs 2.3     Resistance Training   Training Prescription Yes   Weight 4 lbs   Reps 10-15     Interval Training   Interval Training No     Treadmill   MPH 1.8   Grade 0.5   Minutes 15   METs 2.5     NuStep   Level 3   Minutes 15   METs 2.4     Biostep-RELP   Level 3   Minutes  15   METs 2     Home Exercise Plan   Plans to continue exercise at Home (comment)  walking   Frequency Add 2 additional days to program exercise sessions.   Initial Home Exercises Provided 05/06/16      History  Smoking Status  . Never Smoker  Smokeless Tobacco  . Never Used    Goals Met:  Independence with exercise equipment Exercise tolerated well Personal goals reviewed No report of cardiac concerns or symptoms Strength training completed today  Goals Unmet:  Not Applicable  Comments: Pt able to follow exercise prescription today without complaint.  Will continue to monitor for progression.    Dr. Emily Filbert is Medical Director for [place] and [facility].

## 2016-06-08 ENCOUNTER — Encounter: Payer: No Typology Code available for payment source | Admitting: Respiratory Therapy

## 2016-06-08 DIAGNOSIS — Z951 Presence of aortocoronary bypass graft: Secondary | ICD-10-CM | POA: Diagnosis not present

## 2016-06-08 NOTE — Progress Notes (Signed)
Daily Session Note  Patient Details  Name: Scott Acevedo. MRN: [id number] Date of Birth: 1945/12/04 Referring Provider:     Cardiac Rehab from 04/12/2016 in [facility] Cardiac and Pulmonary Rehab  Referring Provider  Frankey Poot MD      Encounter Date: 06/08/2016  Check In:     Session Check In - 06/08/16 0828      Check-In   Location [facility]-Cardiac & Pulmonary Rehab   Staff Present Heath Lark, RN, BSN, Gordy Councilman, MA, ACSM RCEP, Exercise Physiologist;Laureen Janell Quiet, RRT, Respiratory Therapist   Supervising physician immediately available to respond to emergencies See telemetry face sheet for immediately available ER MD   Medication changes reported     No   Fall or balance concerns reported    No   Tobacco Cessation No Change   Warm-up and Cool-down Performed on first and last piece of equipment   Resistance Training Performed Yes   VAD Patient? No     Pain Assessment   Currently in Pain? No/denies   Multiple Pain Sites No         History  Smoking Status   Never Smoker  Smokeless Tobacco   Never Used    Goals Met:  Proper associated with RPD/PD & O2 Sat Independence with exercise equipment Exercise tolerated well No report of cardiac concerns or symptoms Strength training completed today  Goals Unmet:  Not Applicable  Comments: **Pt able to follow exercise prescription today without complaint.  Will continue to monitor for progression.*   Dr. Emily Filbert is Medical Director for [place] and [facility].

## 2016-06-09 ENCOUNTER — Encounter: Payer: Self-pay | Admitting: *Deleted

## 2016-06-09 DIAGNOSIS — Z951 Presence of aortocoronary bypass graft: Secondary | ICD-10-CM

## 2016-06-09 NOTE — Progress Notes (Signed)
Cardiac Individual Treatment Plan  Patient Details  Name: Scott ISHAM Sr. MRN: [id number] Date of Birth: November 28, 1945 Referring Provider:     Cardiac Rehab from 04/12/2016 in [facility] Cardiac and Pulmonary Rehab  Referring Provider  Hinderliter, Antony Haste MD      Initial Encounter Date:    Cardiac Rehab from 04/12/2016 in [facility] Cardiac and Pulmonary Rehab  Date  04/12/16  Referring Provider  Hinderliter, Antony Haste MD      Visit Diagnosis: S/P CABG (coronary artery bypass graft)  Patient's Home Medications on Admission:  Current Outpatient Prescriptions:  .  acetaminophen (TYLENOL) 500 MG tablet, Take 1,000 mg by mouth 3 (three) times daily. , Disp: , Rfl:  .  albuterol (PROVENTIL HFA;VENTOLIN HFA) 108 (90 Base) MCG/ACT inhaler, Inhale 2 puffs into the lungs every 6 (six) hours as needed for wheezing., Disp: , Rfl:  .  amiodarone (PACERONE) 200 MG tablet, Take 200 mg by mouth daily., Disp: , Rfl:  .  apixaban (ELIQUIS) 2.5 MG TABS tablet, Take 2.5 mg by mouth 2 (two) times daily., Disp: , Rfl:  .  aspirin EC 81 MG tablet, Take 81 mg by mouth., Disp: , Rfl:  .  atorvastatin (LIPITOR) 80 MG tablet, Take 80 mg by mouth daily., Disp: , Rfl:  .  Calcium Carbonate-Vitamin D 600-400 MG-UNIT tablet, Take 1 tablet by mouth 2 (two) times daily with a meal., Disp: , Rfl:  .  cephALEXin (KEFLEX) 500 MG capsule, Take 1 capsule (500 mg total) by mouth 4 (four) times daily., Disp: 40 capsule, Rfl: 0 .  cetirizine (ZYRTEC) 10 MG tablet, Take 10 mg by mouth daily., Disp: , Rfl:  .  FLUoxetine (PROZAC) 20 MG tablet, Take 80 mg by mouth daily., Disp: , Rfl:  .  gabapentin (NEURONTIN) 300 MG capsule, Take 600 mg by mouth 3 (three) times daily., Disp: , Rfl:  .  glucosamine-chondroitin 500-400 MG tablet, Take 1 tablet by mouth 2 (two) times daily., Disp: , Rfl:  .  hydrocortisone 2.5 % cream, Apply 1 application topically 2 (two) times daily., Disp: , Rfl:  .  lisinopril (PRINIVIL,ZESTRIL) 2.5 MG tablet, Take 2.5 mg by  mouth daily., Disp: , Rfl:  .  magnesium oxide (MAG-OX) 400 MG tablet, Take 400 mg by mouth 2 (two) times daily., Disp: , Rfl:  .  metFORMIN (GLUCOPHAGE) 500 MG tablet, Take 500 mg by mouth 2 (two) times daily with a meal., Disp: , Rfl:  .  metoprolol succinate (TOPROL-XL) 25 MG 24 hr tablet, Take 12.5 mg by mouth daily. , Disp: , Rfl:  .  mirtazapine (REMERON) 15 MG tablet, Take 45 mg by mouth at bedtime., Disp: , Rfl:  .  pramipexole (MIRAPEX) 1 MG tablet, Take 0.5 mg by mouth every evening., Disp: , Rfl:  .  prochlorperazine (COMPAZINE) 5 MG tablet, Take 10 mg by mouth every 8 (eight) hours as needed for nausea., Disp: , Rfl:  .  ranitidine (ZANTAC) 150 MG tablet, Take 150 mg by mouth 2 (two) times daily., Disp: , Rfl:   Past Medical History: Past Medical History:  Diagnosis Date  . COPD (chronic obstructive pulmonary disease) ([place])   . Diabetes mellitus without complication ([place])   . Hypertension     Tobacco Use: History  Smoking Status  . Never Smoker  Smokeless Tobacco  . Never Used    Labs: Recent Review Flowsheet Data    There is no flowsheet data to display.       Exercise Target Goals:  Exercise Program Goal: Individual exercise prescription set with THRR, safety & activity barriers. Participant demonstrates ability to understand and report RPE using BORG scale, to self-measure pulse accurately, and to acknowledge the importance of the exercise prescription.  Exercise Prescription Goal: Starting with aerobic activity 30 plus minutes a day, 3 days per week for initial exercise prescription. Provide home exercise prescription and guidelines that participant acknowledges understanding prior to discharge.  Activity Barriers & Risk Stratification:     Activity Barriers & Cardiac Risk Stratification - 04/12/16 1308      Activity Barriers & Cardiac Risk Stratification   Activity Barriers Arthritis;Back Problems;Joint Problems;Deconditioning;Muscular  Weakness;Shortness of Breath;Assistive Device;History of Falls;Balance Concerns  fractured hip in [employer]   Cardiac Risk Stratification High      6 Minute Walk:     6 Minute Walk    Row Name 04/12/16 1532         6 Minute Walk   Phase Initial     Distance 1130 feet     Walk Time 6 minutes     # of Rest Breaks 0     MPH 2.14     METS 2.47     RPE 15     Perceived Dyspnea  4     VO2 Peak 8.66     Symptoms Yes (comment)     Comments SOB     Resting HR 77 bpm     Resting BP 180/80  recheck 134/74     Max Ex. HR 93 bpm     Max Ex. BP 186/74     2 Minute Post BP 146/74        Oxygen Initial Assessment:   Oxygen Re-Evaluation:   Oxygen Discharge (Final Oxygen Re-Evaluation):   Initial Exercise Prescription:     Initial Exercise Prescription - 04/12/16 1500      Date of Initial Exercise RX and Referring Provider   Date 04/12/16   Referring Provider Hinderliter, Alan MD     Treadmill   MPH 1.8   Grade 0.5   Minutes 15   METs 2.5     NuStep   Level 2   SPM 80   Minutes 15   METs 2     Biostep-RELP   Level 3   SPM 50   Minutes 15   METs 2     Prescription Details   Frequency (times per week) 2   Duration Progress to 45 minutes of aerobic exercise without signs/symptoms of physical distress     Intensity   THRR 40-80% of Max Heartrate 106-135   Ratings of Perceived Exertion 11-13   Perceived Dyspnea 0-4     Progression   Progression Continue to progress workloads to maintain intensity without signs/symptoms of physical distress.     Resistance Training   Training Prescription Yes   Weight 2 lbs   Reps 10-15      Perform Capillary Blood Glucose checks as needed.  Exercise Prescription Changes:     Exercise Prescription Changes    Row Name 04/22/16 1400 05/07/16 1500 05/18/16 1500 06/02/16 1000       Response to Exercise   Blood Pressure (Admit) 126/80 150/82 138/78 126/76    Blood Pressure (Exercise) 128/62 152/74 134/72 122/69     Blood Pressure (Exit) 114/56 128/74 110/68 126/70    Heart Rate (Admit) 74 bpm 71 bpm 87 bpm 79 bpm    Heart Rate (Exercise) 103 bpm 99 bpm 111 bpm 103 bpm  Heart Rate (Exit) 94 bpm 77 bpm 85 bpm 74 bpm    Rating of Perceived Exertion (Exercise) _0 Symptoms shortness of breath shortness of breath on the treadmill shortness of breath on the treadmill shortness of breath on the treadmill    Duration Progress to 45 minutes of aerobic exercise without signs/symptoms of physical distress Progress to 45 minutes of aerobic exercise without signs/symptoms of physical distress Continue with 45 min of aerobic exercise without signs/symptoms of physical distress. Continue with 45 min of aerobic exercise without signs/symptoms of physical distress.    Intensity THRR unchanged THRR unchanged THRR unchanged THRR unchanged      Progression   Progression Continue to progress workloads to maintain intensity without signs/symptoms of physical distress. Continue to progress workloads to maintain intensity without signs/symptoms of physical distress. Continue to progress workloads to maintain intensity without signs/symptoms of physical distress. Continue to progress workloads to maintain intensity without signs/symptoms of physical distress.    Average METs 2.7 2.3 2.67 2.3      Resistance Training   Training Prescription Yes Yes Yes Yes    Weight 3 lbs 3 lbs 4 lbs 4 lbs    Reps 10-15 10-15 10-15 10-15      Interval Training   Interval Training No No No No      Treadmill   MPH 1.8 1.8 1.8 1.8    Grade 0.5 0.5 0.5 0.5    Minutes _1 METs 2.5 2.5 2.5 2.5      NuStep   Level _2 SPM 80 100 100  -    Minutes _3 METs 3.6 2.5 2.5 2.4      Biostep-RELP   Level _4 SPM 50 50  -  -    Minutes _5 METs _6 Home Exercise Plan   Plans to continue exercise at  - Home (comment)  walking Home (comment)  walking Home (comment)  walking     Frequency  - Add 2 additional days to program exercise sessions. Add 2 additional days to program exercise sessions. Add 2 additional days to program exercise sessions.    Initial Home Exercises Provided  - 05/06/16 05/06/16 05/06/16       Exercise Comments:     Exercise Comments    Row Name 04/20/16 7412 04/22/16 1433         Exercise Comments First full day of exercise!  Patient was oriented to gym and equipment including functions, settings, policies, and procedures.  Patient's individual exercise prescription and treatment plan were reviewed.  All starting workloads were established based on the results of the 6 minute walk test done at initial orientation visit.  The plan for exercise progression was also introduced and progression will be customized based on patient's performance and goals. His weight was up two pounds today and he was encouraged to contact his doctor.  He was complaining of increased shortness of breath and swelling.  He says that his shortness of breath was not new.         Exercise Goals and Review:     Exercise Goals    Row Name 04/12/16 1536             Exercise Goals   Increase Physical Activity Yes  Intervention Provide advice, education, support and counseling about physical activity/exercise needs.;Develop an individualized exercise prescription for aerobic and resistive training based on initial evaluation findings, risk stratification, comorbidities and participant's personal goals.       Expected Outcomes Achievement of increased cardiorespiratory fitness and enhanced flexibility, muscular endurance and strength shown through measurements of functional capacity and personal statement of participant.       Increase Strength and Stamina Yes  more energy and strength       Intervention Provide advice, education, support and counseling about physical activity/exercise needs.;Develop an individualized exercise prescription for aerobic and  resistive training based on initial evaluation findings, risk stratification, comorbidities and participant's personal goals.       Expected Outcomes Achievement of increased cardiorespiratory fitness and enhanced flexibility, muscular endurance and strength shown through measurements of functional capacity and personal statement of participant.          Exercise Goals Re-Evaluation :     Exercise Goals Re-Evaluation    Row Name 04/22/16 1431 05/06/16 1042 05/07/16 1550 05/18/16 1523 06/02/16 1035     Exercise Goal Re-Evaluation   Exercise Goals Review Increase Physical Activity;Increase Strenth and Stamina Increase Physical Activity;Increase Strenth and Stamina Increase Physical Activity;Increase Strenth and Stamina Increase Physical Activity;Increase Strenth and Stamina Increase Physical Activity;Increase Strenth and Stamina   Comments Scott Acevedo has completed two full days of exercise.  He is off to a good start.  We will continue to monitor his progression. Reviewed home exercise with pt today.  Pt plans to walk at home for exercise.  Reviewed THR, pulse, RPE, sign and symptoms, and when to call 911 or MD.  Also discussed weather considerations and indoor options.  Pt voiced understanding  He is already doing two days a week at home.  He has been able to tell a difference in how he is feeling as his strength has started to improve.  We will continue to work with him. Scott Acevedo has been doing well in rehab.  We have discussed using this time for himself and letting us know how exercise really is going for him.  He has SOB on the treadmill and usually doesn't want [place] to know about it. Scott Acevedo continues to do well in rehab.  He seems to be enjoying coming to classes.  He is now up to 3.0 METs on the BioStep.  We will be moving him up on that soon!  He has also increased his weights up to 4lbs.  We will continue to monitor his progression. Scott Acevedo has been doing well in rehab.  He has missed the last week due to  doctor's appointments for him and his wife.  He is up to level 2 now on the BioStep.  We will continue to monitor his progress.   Expected Outcomes Short and Long: Scott Acevedo will attend classes to work on strength and stamina. Short: Continue to exercise two days a week at home consistently.  Long: Add in thrid day at home and continue to work on increasing his stamina. Short: Add in his home exercise consistently.  Long: Continue to come to class to work on strength and stamina Short: Increase workload on the BioStep and increase spm on NuStep.  Long: Continue to come to class and add in exercise at home. Short: Increase speed on treadmill.  Long: Continue to exercise regularly.   [place] Name 06/03/16 1025             Exercise Goal Re-Evaluation   Exercise Goals  Review Increase Physical Activity;Increase Strenth and Stamina       Comments Scott Acevedo can tell the difference that rehab is making for him.  He can tell that he has increased his stamina and his endurance.  He is also less short of breath than when he started.  He is walking some with his wife and doing some squats to build up strength at home.         Expected Outcomes Short: Increase time walking at home.  Long: Exercise more regularly          Discharge Exercise Prescription (Final Exercise Prescription Changes):     Exercise Prescription Changes - 06/02/16 1000      Response to Exercise   Blood Pressure (Admit) 126/76   Blood Pressure (Exercise) 122/69   Blood Pressure (Exit) 126/70   Heart Rate (Admit) 79 bpm   Heart Rate (Exercise) 103 bpm   Heart Rate (Exit) 74 bpm   Rating of Perceived Exertion (Exercise) 12   Symptoms shortness of breath on the treadmill   Duration Continue with 45 min of aerobic exercise without signs/symptoms of physical distress.   Intensity THRR unchanged     Progression   Progression Continue to progress workloads to maintain intensity without signs/symptoms of physical distress.   Average METs 2.3      Resistance Training   Training Prescription Yes   Weight 4 lbs   Reps 10-15     Interval Training   Interval Training No     Treadmill   MPH 1.8   Grade 0.5   Minutes 15   METs 2.5     NuStep   Level 3   Minutes 15   METs 2.4     Biostep-RELP   Level 3   Minutes 15   METs 2     Home Exercise Plan   Plans to continue exercise at Home (comment)  walking   Frequency Add 2 additional days to program exercise sessions.   Initial Home Exercises Provided 05/06/16      Nutrition:  Target Goals: Understanding of nutrition guidelines, daily intake of sodium <1534m, cholesterol <2051m calories 30% from fat and 7% or less from saturated fats, daily to have 5 or more servings of fruits and vegetables.  Biometrics:     Pre Biometrics - 04/12/16 1536      Pre Biometrics   Height _0  (1.854 m)   Weight 260 lb 12.8 oz (118.3 kg)   Waist Circumference 47 inches   Hip Circumference 47 inches   Waist to Hip Ratio 1 %   BMI (Calculated) 34.5   Single Leg Stand 9.28 seconds       Nutrition Therapy Plan and Nutrition Goals:     Nutrition Therapy & Goals - 05/11/16 1108      Nutrition Therapy   Diet Instructed patient accompanied by his wife on a meal plan based on 2000 calories including heart healthy dietary guidelines as well as diabetes dietary guidelines.   Drug/Food Interactions Statins/Certain Fruits   Protein (specify units) 8 ounces   Fiber 30 grams   Whole Grain Foods 3 servings   Saturated Fats 13 max. grams   Fruits and Vegetables 5 servings/day   Sodium 2000 grams  [id number]     Personal Nutrition Goals   Nutrition Goal Increase fruits and vegetables to at least 5 servings daily.   Personal Goal #2 Read labels for saturated fat, trans fat and sodium   Personal Goal #3  Rinse canned vegetables; use fresh or frozen whenever possible.   Personal Goal #4 Balance meals with protein, 3-4 servings of carbohydrate and non-starchy vegetables. Refer to  "Diabetes Plate" hand-out and "Planning a Balanced Meal" handout.     Intervention Plan   Intervention Prescribe, educate and counsel regarding individualized specific dietary modifications aiming towards targeted core components such as weight, hypertension, lipid management, diabetes, heart failure and other comorbidities.;Nutrition handout(s) given to patient.   Expected Outcomes Short Term Goal: Understand basic principles of dietary content, such as calories, fat, sodium, cholesterol and nutrients.;Short Term Goal: A plan has been developed with personal nutrition goals set during dietitian appointment.;Long Term Goal: Adherence to prescribed nutrition plan.      Nutrition Discharge: Rate Your Plate Scores:   Nutrition Goals Re-Evaluation:     Nutrition Goals Re-Evaluation    Row Name 05/06/16 1054 06/03/16 1039           Goals   Nutrition Goal  - Increase fruits and vegetables to at least 5 servings daily. Read labels, Rinse canned vegetables.  More protein.      Comment Scott Acevedo has started to work on his diet some.  He is trying to stay away from white grains.  He has an appointment to meet with the dietician on Tuesday April 24. Sayan has increased his intake of fruits and vegetables, but his weakness is a tomato sandwich.  They are reading labels.  His wife has started to rinse some vegetables, but not all of them yet.  They are working on more protein, but still need more.  They are still eating white grains and was encouraged to switch to whole grains.      Expected Outcome Set nutrition goals. Short: Eat more protein and watch sugars and simple carbs.  Long: Eat a heart healthy diet.         Nutrition Goals Discharge (Final Nutrition Goals Re-Evaluation):     Nutrition Goals Re-Evaluation - 06/03/16 1039      Goals   Nutrition Goal Increase fruits and vegetables to at least 5 servings daily. Read labels, Rinse canned vegetables.  More protein.   Comment Scott Acevedo has increased  his intake of fruits and vegetables, but his weakness is a tomato sandwich.  They are reading labels.  His wife has started to rinse some vegetables, but not all of them yet.  They are working on more protein, but still need more.  They are still eating white grains and was encouraged to switch to whole grains.   Expected Outcome Short: Eat more protein and watch sugars and simple carbs.  Long: Eat a heart healthy diet.      Psychosocial: Target Goals: Acknowledge presence or absence of significant depression and/or stress, maximize coping skills, provide positive support system. Participant is able to verbalize types and ability to use techniques and skills needed for reducing stress and depression.   Initial Review & Psychosocial Screening:     Initial Psych Review & Screening - 04/12/16 1311      Initial Review   Current issues with Current Stress Concerns   Source of Stress Concerns Chronic Illness;Occupation     Family Dynamics   [place]? Yes     Barriers   Psychosocial barriers to participate in program The patient should benefit from training in stress management and relaxation.     Screening Interventions   Interventions Yes;Encouraged to exercise;Program counselor consult   Expected Outcomes Short Term goal: Utilizing psychosocial counselor, staff  and physician to assist with identification of specific Stressors or current issues interfering with healing process. Setting desired goal for each stressor or current issue identified.;Long Term Goal: Stressors or current issues are controlled or eliminated.;Short Term goal: Identification and review with participant of any Quality of Life or Depression concerns found by scoring the questionnaire.      Quality of Life Scores:    PHQ-9: Recent Review Flowsheet Data    Depression screen [facility] 2/9 04/12/2016   Decreased Interest 0   Down, Depressed, Hopeless 2   PHQ - 2 Score 2   Altered sleeping 2   Tired, decreased  energy 3   Change in appetite 0   Feeling bad or failure about yourself  1   Trouble concentrating 2   Moving slowly or fidgety/restless 0   Suicidal thoughts 1   PHQ-9 Score 11   Difficult doing work/chores Somewhat difficult     Interpretation of Total Score  Total Score Depression Severity:  1-4 = Minimal depression, 5-9 = Mild depression, 10-14 = Moderate depression, 15-19 = Moderately severe depression, 20-27 = Severe depression   Psychosocial Evaluation and Intervention:     Psychosocial Evaluation - 05/06/16 1049      Psychosocial Evaluation & Interventions   Interventions Stress management education;Encouraged to exercise with the program and follow exercise prescription   Comments Scott Acevedo has not yet met with Juliann Pulse.  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  He wears his pastoral care badges every day as most days he is on the go.  Tuesday was a rough day with multiple appointments in different locations and his son was having surgery as well.  He has admitted that his doctor has encouraged him to slow down some.  He is starting to take that to heart and rested most of the day yesterday.  He finds his stress relief in his stamp collection.  He would like to be able to get off some of his meds, breathe better, and lose weight while in the program.  We will continue to check in with him to see how he is handling his stressors.   Expected Outcomes Short: Take more time for self and exercise.  Long: Continue to focus on self care as well.   Continue Psychosocial Services  Follow up required by counselor      Psychosocial Re-Evaluation:     Psychosocial Re-Evaluation    [place] Name 06/03/16 [phone]             Psychosocial Re-Evaluation   Current issues with Current Sleep Concerns;Current Stress Concerns       Comments Scott Acevedo is still not sleeping very well despite being on 3 does of a medication.  He does want to talk to his physcian more about it.  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  He has also recently added that his wife has a broken arm currently and then his car died on his way into rehab today.  He continues to have a lot on his plate.  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  He is working on improving his self care.  Overall he is generally positive and has felt the program has been a blessing to him and eye opening.       Expected Outcomes Short: Continue to come to class, talk to doctor about sleep.  Long: Maintain postive outlook and his prayer time.  Interventions Encouraged to attend Cardiac Rehabilitation for the exercise;Stress management education       Continue Psychosocial Services  Follow up required by staff         Initial Review   Source of Stress Concerns Chronic Illness;Occupation          Psychosocial Discharge (Final Psychosocial Re-Evaluation):     Psychosocial Re-Evaluation - 06/03/16 0922      Psychosocial Re-Evaluation   Current issues with Current Sleep Concerns;Current Stress Concerns   Comments Scott Acevedo is still not sleeping very well despite being on 3 does of a medication.  He does want to talk to his physcian more about it.  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  He has also recently added that his wife has a broken arm currently and then his car died on his way into rehab today.  He continues to have a lot on his plate.  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  He is working on improving his self care.  Overall he is generally positive and has felt the program has been a blessing to him and eye opening.   Expected Outcomes Short: Continue to come to class, talk to doctor about sleep.  Long: Maintain postive outlook and his prayer time.   Interventions Encouraged to attend Cardiac Rehabilitation for the exercise;Stress management education   Continue Psychosocial Services  Follow up required by staff     Initial  Review   Source of Stress Concerns Chronic Illness;Occupation      Vocational Rehabilitation: Provide vocational rehab assistance to qualifying candidates.   Vocational Rehab Evaluation & Intervention:     Vocational Rehab - 04/12/16 1255      Initial Vocational Rehab Evaluation & Intervention   Assessment shows need for Vocational Rehabilitation No      Education: Education Goals: Education classes will be provided on a weekly basis, covering required topics. Participant will state understanding/return demonstration of topics presented.  Learning Barriers/Preferences:     Learning Barriers/Preferences - 04/12/16 1255      Learning Barriers/Preferences   Learning Barriers Sight;Hearing   Learning Preferences None      Education Topics: General Nutrition Guidelines/Fats and Fiber: -Group instruction provided by verbal, written material, models and posters to present the general guidelines for heart healthy nutrition. Gives an explanation and review of dietary fats and fiber.   Controlling Sodium/Reading Food Labels: -Group verbal and written material supporting the discussion of sodium use in heart healthy nutrition. Review and explanation with models, verbal and written materials for utilization of the food label.   Cardiac Rehab from 06/08/2016 in [facility] Cardiac and Pulmonary Rehab  Date  05/25/16  Educator  PI  Instruction Review Code  2- meets goals/outcomes      Exercise Physiology & Risk Factors: - Group verbal and written instruction with models to review the exercise physiology of the cardiovascular system and associated critical values. Details cardiovascular disease risk factors and the goals associated with each risk factor.   Aerobic Exercise & Resistance Training: - Gives group verbal and written discussion on the health impact of inactivity. On the components of aerobic and resistive training programs and the benefits of this training and how to safely  progress through these programs.   Cardiac Rehab from 06/08/2016 in [facility] Cardiac and Pulmonary Rehab  Date  06/03/16  Educator  [facility]  Instruction Review Code  2- meets goals/outcomes  Flexibility, Balance, General Exercise Guidelines: - Provides group verbal and written instruction on the benefits of flexibility and balance training programs. Provides general exercise guidelines with specific guidelines to those with heart or lung disease. Demonstration and skill practice provided.   Cardiac Rehab from 06/08/2016 in [facility] Cardiac and Pulmonary Rehab  Date  06/08/16  Educator  [facility]  Instruction Review Code  2- meets goals/outcomes      Stress Management: - Provides group verbal and written instruction about the health risks of elevated stress, cause of high stress, and healthy ways to reduce stress.   Cardiac Rehab from 06/08/2016 in [facility] Cardiac and Pulmonary Rehab  Date  04/20/16  Educator  [facility]  Instruction Review Code  2- meets goals/outcomes      Depression: - Provides group verbal and written instruction on the correlation between heart/lung disease and depressed mood, treatment options, and the stigmas associated with seeking treatment.   Anatomy & Physiology of the Heart: - Group verbal and written instruction and models provide basic cardiac anatomy and physiology, with the coronary electrical and arterial systems. Review of: AMI, Angina, Valve disease, Heart Failure, Cardiac Arrhythmia, Pacemakers, and the ICD.   Cardiac Rehab from 06/08/2016 in [facility] Cardiac and Pulmonary Rehab  Date  05/20/16  Educator  [place]  Instruction Review Code  2- meets goals/outcomes      Cardiac Procedures: - Group verbal and written instruction and models to describe the testing methods done to diagnose heart disease. Reviews the outcomes of the test results. Describes the treatment choices: Medical Management, Angioplasty, or Coronary Bypass Surgery.   Cardiac Rehab from 06/08/2016 in [facility]  Cardiac and Pulmonary Rehab  Date  04/27/16  Educator  [place]  Instruction Review Code  2- meets goals/outcomes      Cardiac Medications: - Group verbal and written instruction to review commonly prescribed medications for heart disease. Reviews the medication, class of the drug, and side effects. Includes the steps to properly store meds and maintain the prescription regimen.   Cardiac Rehab from 06/08/2016 in [facility] Cardiac and Pulmonary Rehab  Date  05/06/16  Educator  [place]  Instruction Review Code  2- meets goals/outcomes      Go Sex-Intimacy & Heart Disease, Get SMART - Goal Setting: - Group verbal and written instruction through game format to discuss heart disease and the return to sexual intimacy. Provides group verbal and written material to discuss and apply goal setting through the application of the S.M.A.R.T. Method.   Cardiac Rehab from 06/08/2016 in [facility] Cardiac and Pulmonary Rehab  Date  04/27/16  Educator  SB  Instruction Review Code  2- meets goals/outcomes      Other Matters of the Heart: - Provides group verbal, written materials and models to describe Heart Failure, Angina, Valve Disease, and Diabetes in the realm of heart disease. Includes description of the disease process and treatment options available to the cardiac patient.   Cardiac Rehab from 06/08/2016 in [facility] Cardiac and Pulmonary Rehab  Date  04/22/16  Educator  SB  Instruction Review Code  2- meets goals/outcomes      Exercise & Equipment Safety: - Individual verbal instruction and demonstration of equipment use and safety with use of the equipment.   Cardiac Rehab from 06/08/2016 in [facility] Cardiac and Pulmonary Rehab  Date  04/12/16  Educator  C. [place]  Instruction Review Code  1- partially meets, needs review/practice      Infection Prevention: - Provides verbal and written material to individual with  discussion of infection control including proper hand washing and proper equipment cleaning  during exercise session.   Cardiac Rehab from 06/08/2016 in [facility] Cardiac and Pulmonary Rehab  Date  04/12/16  Educator  C. [place],RN  Instruction Review Code  2- meets goals/outcomes      Falls Prevention: - Provides verbal and written material to individual with discussion of falls prevention and safety.   Cardiac Rehab from 06/08/2016 in [facility] Cardiac and Pulmonary Rehab  Date  04/12/16  Educator  [place]. [place]  Instruction Review Code  1- partially meets, needs review/practice      Diabetes: - Individual verbal and written instruction to review signs/symptoms of diabetes, desired ranges of glucose level fasting, after meals and with exercise. Advice that pre and post exercise glucose checks will be done for 3 sessions at entry of program.   Cardiac Rehab from 06/08/2016 in [facility] Cardiac and Pulmonary Rehab  Date  04/12/16  Educator  C. [place]  Instruction Review Code  1- partially meets, needs review/practice       Knowledge Questionnaire Score:     Knowledge Questionnaire Score - 04/12/16 1255      Knowledge Questionnaire Score   Pre Score 18/28      Core Components/Risk Factors/Patient Goals at Admission:     Personal Goals and Risk Factors at Admission - 04/12/16 1308      Core Components/Risk Factors/Patient Goals on Admission    Weight Management Yes;Obesity;Weight Maintenance   Intervention Weight Management: Develop a combined nutrition and exercise program designed to reach desired caloric intake, while maintaining appropriate intake of nutrient and fiber, sodium and fats, and appropriate energy expenditure required for the weight goal.;Weight Management: Provide education and appropriate resources to help participant work on and attain dietary goals.;Weight Management/Obesity: Establish reasonable short term and long term weight goals.;Obesity: Provide education and appropriate resources to help participant work on and attain dietary goals.   Admit Weight 260  lb 12.8 oz (118.3 kg)   Goal Weight: Short Term 230 lb (104.3 kg)   Goal Weight: Long Term 185 lb (83.9 kg)   Expected Outcomes Short Term: Continue to assess and modify interventions until short term weight is achieved;Weight Loss: Understanding of general recommendations for a balanced deficit meal plan, which promotes 1-2 lb weight loss per week and includes a negative energy balance of [phone] kcal/d;Understanding recommendations for meals to include 15-35% energy as protein, 25-35% energy from fat, 35-60% energy from carbohydrates, less than 237m of dietary cholesterol, 20-35 gm of total fiber daily;Understanding of distribution of calorie intake throughout the day with the consumption of 4-5 meals/snacks   Improve shortness of breath with ADL's Yes   Intervention Provide education, individualized exercise plan and daily activity instruction to help decrease symptoms of SOB with activities of daily living.   Expected Outcomes Short Term: Achieves a reduction of symptoms when performing activities of daily living.   Develop more efficient breathing techniques such as purse lipped breathing and diaphragmatic breathing; and practicing self-pacing with activity Yes   Intervention Provide education, demonstration and support about specific breathing techniuqes utilized for more efficient breathing. Include techniques such as pursed lipped breathing, diaphragmatic breathing and self-pacing activity.   Expected Outcomes Short Term: Participant will be able to demonstrate and use breathing techniques as needed throughout daily activities.   Diabetes Yes   Intervention Provide education about signs/symptoms and action to take for hypo/hyperglycemia.;Provide education about proper nutrition, including hydration, and aerobic/resistive exercise prescription along with prescribed medications to achieve  blood glucose in normal ranges: Fasting glucose 65-99 mg/dL   Expected Outcomes Short Term: Participant  verbalizes understanding of the signs/symptoms and immediate care of hyper/hypoglycemia, proper foot care and importance of medication, aerobic/resistive exercise and nutrition plan for blood glucose control.;Long Term: Attainment of HbA1C < 7%.   Hypertension Yes   Intervention Monitor prescription use compliance.;Provide education on lifestyle modifcations including regular physical activity/exercise, weight management, moderate sodium restriction and increased consumption of fresh fruit, vegetables, and low fat dairy, alcohol moderation, and smoking cessation.   Expected Outcomes Short Term: Continued assessment and intervention until BP is < 140/19m HG in hypertensive participants. < 130/828mHG in hypertensive participants with diabetes, heart failure or chronic kidney disease.;Long Term: Maintenance of blood pressure at goal levels.   Lipids Yes   Intervention Provide education and support for participant on nutrition & aerobic/resistive exercise along with prescribed medications to achieve LDL <7063mHDL >83m73m Expected Outcomes Short Term: Participant states understanding of desired cholesterol values and is compliant with medications prescribed. Participant is following exercise prescription and nutrition guidelines.;Long Term: Cholesterol controlled with medications as prescribed, with individualized exercise RX and with personalized nutrition plan. Value goals: LDL < 70mg52mL > 40 mg.   Stress Yes   Intervention Offer individual and/or small group education and counseling on adjustment to heart disease, stress management and health-related lifestyle change. Teach and support self-help strategies.;Refer participants experiencing significant psychosocial distress to appropriate mental health specialists for further evaluation and treatment. When possible, include family members and significant others in education/counseling sessions.   Expected Outcomes Short Term: Participant demonstrates  changes in health-related behavior, relaxation and other stress management skills, ability to obtain effective social support, and compliance with psychotropic medications if prescribed.;Long Term: Emotional wellbeing is indicated by absence of clinically significant psychosocial distress or social isolation.      Core Components/Risk Factors/Patient Goals Review:      Goals and Risk Factor Review    Row Name 05/06/16 1043 05/11/16 1036 06/03/16 1034         Core Components/Risk Factors/Patient Goals Review   Personal Goals Review Weight Management/Obesity;Develop more efficient breathing techniques such as purse lipped breathing and diaphragmatic breathing and practicing self-pacing with activity.;Improve shortness of breath with ADL's;Diabetes;Hypertension;Lipids Weight Management/Obesity Weight Management/Obesity;Hypertension;Diabetes;Lipids     Review JamesYalebeen doing well in rehab.  He is still struggling with weight gain and shortness of breath.  [facility] [place] [facility] [place] to keep exercising and drink plenty of water.  Since his weight was up again today and his breathing continues to not improve, I encouraged him to call them again to let them know he has gained 4 lbs in a week.  We did review how to do pursed lip and diaphragmatic breathing to help with breath control during activity.  His blood sugars and statins have been doing well and stay stable. However, his blood pressures continue to be all over the map, no consistentsy.  He says that he is checking it at home and keeping a log which I encouraged him to show to his doctor at his next visit.   He talked to his doctor last week about his weight gain.  They did not feel that it was heart failure, but rather that he needed to control his eating and portion sizes better. JamesOttis to ER last week and was found to have fluid in his lungs.  He started Lasix and is feeling better.  His blood pressures are good and  stable.  Blood sugars have beena little high, but trending down.  He is still eating more sweets than he should.  No problems with his statins.     Expected Outcomes Short: Call doctor about shortness of breath and weight gain.  Long: Continue to work on weight loss, risk factors, and pursed lip breathing.   Short: Has appt with dietician today.  Long: Work on improved diet and control. Short: Continue to work on weight loss.  Long: Improve diet and control.        Core Components/Risk Factors/Patient Goals at Discharge (Final Review):      Goals and Risk Factor Review - 06/03/16 1034      Core Components/Risk Factors/Patient Goals Review   Personal Goals Review Weight Management/Obesity;Hypertension;Diabetes;Lipids   Review Labib went to ER last week and was found to have fluid in his lungs.  He started Lasix and is feeling better.  His blood pressures are good and stable.  Blood sugars have beena little high, but trending down.  He is still eating more sweets than he should.  No problems with his statins.   Expected Outcomes Short: Continue to work on weight loss.  Long: Improve diet and control.      ITP Comments:     ITP Comments    Row Name 04/12/16 1305 04/14/16 0611 05/12/16 0552 05/18/16 0844 06/09/16 0855   ITP Comments ITP Created during Medical Review, Documenation of DX Veteran's Admin Notes which will be scanned.  30 day review. Continue with ITP unless directed changes per Medical Director review  New to program 30 day review. Continue with ITP unless directed changes per Medical Director review Mr Rachell Cipro called out today due to another appointment. 30 day review. Continue with ITP unless directed changes per Medical Director review      Comments:

## 2016-06-10 ENCOUNTER — Encounter: Payer: No Typology Code available for payment source | Admitting: *Deleted

## 2016-06-10 DIAGNOSIS — Z951 Presence of aortocoronary bypass graft: Secondary | ICD-10-CM

## 2016-06-10 NOTE — Progress Notes (Signed)
Daily Session Note  Patient Details  Name: Scott HOCHSTETLER Sr. MRN: [id number] Date of Birth: 12-16-1945 Referring Provider:     Cardiac Rehab from 04/12/2016 in [facility] Cardiac and Pulmonary Rehab  Referring Provider  Frankey Poot MD      Encounter Date: 06/10/2016  Check In:     Session Check In - 06/10/16 0911      Check-In   Location [facility]-Cardiac & Pulmonary Rehab   Staff Present Nada Maclachlan, BA, ACSM CEP, Exercise Physiologist;Jessica Luan Pulling, MA, ACSM RCEP, Exercise Physiologist;Meredith Sherryll Burger, RN BSN   Supervising physician immediately available to respond to emergencies See telemetry face sheet for immediately available ER MD   Medication changes reported     No   Fall or balance concerns reported    No   Warm-up and Cool-down Performed on first and last piece of equipment   Resistance Training Performed Yes   VAD Patient? No     Pain Assessment   Currently in Pain? No/denies   Multiple Pain Sites No         History  Smoking Status  . Never Smoker  Smokeless Tobacco  . Never Used    Goals Met:  Independence with exercise equipment Exercise tolerated well No report of cardiac concerns or symptoms Strength training completed today  Goals Unmet:  Not Applicable  Comments: Pt able to follow exercise prescription today without complaint.  Will continue to monitor for progression.    Dr. Emily Filbert is Medical Director for [place] and [facility].

## 2016-06-15 ENCOUNTER — Encounter: Payer: No Typology Code available for payment source | Admitting: Respiratory Therapy

## 2016-06-15 DIAGNOSIS — Z951 Presence of aortocoronary bypass graft: Secondary | ICD-10-CM | POA: Diagnosis not present

## 2016-06-15 NOTE — Progress Notes (Signed)
Daily Session Note  Patient Details  Name: Scott Acevedo Sr. MRN: [id number] Date of Birth: 1945-11-14 Referring Provider:     Cardiac Rehab from 04/12/2016 in [facility] Cardiac and Pulmonary Rehab  Referring Provider  Frankey Poot MD      Encounter Date: 06/15/2016  Check In:     Session Check In - 06/15/16 0835      Check-In   Location [facility]-Cardiac & Pulmonary Rehab   Staff Present Alberteen Sam, MA, ACSM RCEP, Exercise Physiologist;Krista Frederico Hamman, RN BSN;Laureen Janell Quiet, RRT, Respiratory Therapist   Supervising physician immediately available to respond to emergencies See telemetry face sheet for immediately available ER MD   Medication changes reported     No   Fall or balance concerns reported    No   Tobacco Cessation No Change   Warm-up and Cool-down Performed on first and last piece of equipment   Resistance Training Performed Yes   VAD Patient? No     Pain Assessment   Currently in Pain? No/denies   Multiple Pain Sites No         History  Smoking Status   Never Smoker  Smokeless Tobacco   Never Used    Goals Met:  Proper associated with RPD/PD & O2 Sat Independence with exercise equipment Exercise tolerated well No report of cardiac concerns or symptoms Strength training completed today  Goals Unmet:  Not Applicable  Comments: Pt able to follow exercise prescription today without complaint.  Will continue to monitor for progression.   Dr. Emily Filbert is Medical Director for [place] and [facility].

## 2016-06-17 ENCOUNTER — Encounter: Payer: No Typology Code available for payment source | Admitting: *Deleted

## 2016-06-17 DIAGNOSIS — Z951 Presence of aortocoronary bypass graft: Secondary | ICD-10-CM

## 2016-06-17 NOTE — Progress Notes (Signed)
Daily Session Note  Patient Details  Name: Scott KONICEK Sr. MRN: [id number] Date of Birth: 12/12/1945 Referring Provider:     Cardiac Rehab from 04/12/2016 in [facility] Cardiac and Pulmonary Rehab  Referring Provider  Frankey Poot MD      Encounter Date: 06/17/2016  Check In:     Session Check In - 06/17/16 0849      Check-In   Location [facility]-Cardiac & Pulmonary Rehab   Staff Present Alberteen Sam, MA, ACSM RCEP, Exercise Physiologist;Amanda Oletta Darter, BA, ACSM CEP, Exercise Physiologist;Krista Frederico Hamman, RN BSN;Meredith Sherryll Burger, RN BSN   Supervising physician immediately available to respond to emergencies See telemetry face sheet for immediately available ER MD   Medication changes reported     No   Fall or balance concerns reported    No   Tobacco Cessation No Change   Warm-up and Cool-down Performed on first and last piece of equipment   Resistance Training Performed Yes   VAD Patient? No     Pain Assessment   Currently in Pain? No/denies   Multiple Pain Sites No         History  Smoking Status  . Never Smoker  Smokeless Tobacco  . Never Used    Goals Met:  Independence with exercise equipment Exercise tolerated well No report of cardiac concerns or symptoms Strength training completed today  Goals Unmet:  Not Applicable  Comments: Pt able to follow exercise prescription today without complaint.  Will continue to monitor for progression.    Dr. Emily Filbert is Medical Director for [place] and [facility].

## 2016-06-22 ENCOUNTER — Encounter: Payer: No Typology Code available for payment source | Attending: Internal Medicine | Admitting: Respiratory Therapy

## 2016-06-22 DIAGNOSIS — Z951 Presence of aortocoronary bypass graft: Secondary | ICD-10-CM

## 2016-06-22 DIAGNOSIS — I2510 Atherosclerotic heart disease of native coronary artery without angina pectoris: Secondary | ICD-10-CM | POA: Insufficient documentation

## 2016-06-22 LAB — GLUCOSE, CAPILLARY: Glucose-Capillary: 112 mg/dL — ABNORMAL HIGH (ref 65–99)

## 2016-06-22 NOTE — Progress Notes (Signed)
Daily Session Note  Patient Details  Name: Scott CHITTICK Sr. MRN: [id number] Date of Birth: 1945/08/22 Referring Provider:     Cardiac Rehab from 04/12/2016 in [facility] Cardiac and Pulmonary Rehab  Referring Provider  Scott Poot MD      Encounter Date: 06/22/2016  Check In:     Session Check In - 06/22/16 0846      Check-In   Location [facility]-Cardiac & Pulmonary Rehab   Staff Present Heath Lark, RN, BSN, Gordy Councilman, MA, ACSM RCEP, Exercise Physiologist;Laureen Janell Quiet, RRT, Respiratory Therapist   Supervising physician immediately available to respond to emergencies See telemetry face sheet for immediately available ER MD   Medication changes reported     No   Fall or balance concerns reported    No   Tobacco Cessation No Change   Warm-up and Cool-down Performed on first and last piece of equipment   Resistance Training Performed Yes   VAD Patient? No     Pain Assessment   Currently in Pain? No/denies   Multiple Pain Sites No         History  Smoking Status   Never Smoker  Smokeless Tobacco   Never Used    Goals Met:  Proper associated with RPD/PD & O2 Sat Independence with exercise equipment Exercise tolerated well No report of cardiac concerns or symptoms Strength training completed today [name] did shorten his exercise goals today due to lightheadedness. His BP and glucose were acceptable. 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 He is planning to contact his doctor.  Goals Unmet:  Not Applicable  Comments: Pt able to follow exercise prescription today without complaint.  Will continue to monitor for progression.   Dr. Emily Filbert is Medical Director for [place] and [facility].

## 2016-06-24 DIAGNOSIS — Z951 Presence of aortocoronary bypass graft: Secondary | ICD-10-CM | POA: Diagnosis not present

## 2016-06-24 NOTE — Progress Notes (Signed)
Daily Session Note  Patient Details  Name: Scott DOREY Sr. MRN: [id number] Date of Birth: January 05, 1946 Referring Provider:     Cardiac Rehab from 04/12/2016 in [facility] Cardiac and Pulmonary Rehab  Referring Provider  Frankey Poot MD      Encounter Date: 06/24/2016  Check In:     Session Check In - 06/24/16 0906      Check-In   Location [facility]-Cardiac & Pulmonary Rehab   Staff Present Darel Hong, RN Vickki Hearing, BA, ACSM CEP, Exercise Physiologist;Wilmina Maxham Luan Pulling, [place], ACSM RCEP, Exercise Physiologist   Supervising physician immediately available to respond to emergencies See telemetry face sheet for immediately available ER MD   Medication changes reported     No   Fall or balance concerns reported    No   Warm-up and Cool-down Performed on first and last piece of equipment   Resistance Training Performed Yes   VAD Patient? No     Pain Assessment   Currently in Pain? No/denies         History  Smoking Status  . Never Smoker  Smokeless Tobacco  . Never Used    Goals Met:  Independence with exercise equipment Exercise tolerated well No report of cardiac concerns or symptoms Strength training completed today  Goals Unmet:  Not Applicable  Comments: Pt able to follow exercise prescription today without complaint.  Will continue to monitor for progression.    Dr. Emily Filbert is Medical Director for [place] and [facility].

## 2016-06-29 ENCOUNTER — Encounter: Payer: No Typology Code available for payment source | Admitting: Respiratory Therapy

## 2016-06-29 DIAGNOSIS — Z951 Presence of aortocoronary bypass graft: Secondary | ICD-10-CM | POA: Diagnosis not present

## 2016-06-29 LAB — GLUCOSE, CAPILLARY: Glucose-Capillary: 120 mg/dL — ABNORMAL HIGH (ref 65–99)

## 2016-06-29 NOTE — Progress Notes (Signed)
Daily Session Note  Patient Details  Name: Scott BENDER Sr. MRN: [id number] Date of Birth: June 26, 1945 Referring Provider:     Cardiac Rehab from 04/12/2016 in [facility] Cardiac and Pulmonary Rehab  Referring Provider  Frankey Poot MD      Encounter Date: 06/29/2016  Check In:     Session Check In - 06/29/16 0857      Check-In   Location [facility]-Cardiac & Pulmonary Rehab   Staff Present Heath Lark, RN, BSN, CCRP;Waynette Towers Luan Pulling, MA, ACSM RCEP, Exercise Physiologist;Laureen Owens Shark, BS, RRT, Respiratory Therapist;Other  Roanna Epley RN, Darcella Gasman RRT   Supervising physician immediately available to respond to emergencies See telemetry face sheet for immediately available ER MD   Medication changes reported     No   Fall or balance concerns reported    No   Warm-up and Cool-down Performed on first and last piece of equipment   Resistance Training Performed Yes   VAD Patient? No     Pain Assessment   Currently in Pain? No/denies   Multiple Pain Sites No         History  Smoking Status  . Never Smoker  Smokeless Tobacco  . Never Used    Goals Met:  Strength training completed today  Goals Unmet:  Not Applicable  Comments: Ramar had a dizzy spell on the treadmill.  He was fine on the NuStep, but after 9 minutes on the treadmill he got dizzy and lightheaded. His blood pressure had dropped to below 90 mmHg. His blood sugar was fine (155 mg/dl). He skipped breakfast but did eat a pack of crackers during education.  He was given water and feet elevated blood pressure did recover to just above 100 mmHG.  He then tried to stand and got dizzy and blood pressure had dropped again. He was given more water and eventually did recover to above 100 mmHg and was asymptomatic.  He was given his blood pressures and encouraged to call his cardiologist at the [place].   Dr. Emily Filbert is Medical Director for [place] and [facility].

## 2016-06-30 ENCOUNTER — Telehealth: Payer: Self-pay | Admitting: *Deleted

## 2016-06-30 ENCOUNTER — Encounter: Payer: Self-pay | Admitting: *Deleted

## 2016-06-30 DIAGNOSIS — Z951 Presence of aortocoronary bypass graft: Secondary | ICD-10-CM

## 2016-06-30 NOTE — Telephone Encounter (Signed)
Ms. Blanchard KelchGail Shaffer, PA called in reference to Mr. Olthoff's BP drop yesterday.  She is his cardiologist.  She stated that he has a history of being orthostatic and thought it may have just been exaggerated yesterday since he had not eaten prior to rehab.  She asked that if anything comes up in the future to let her know.  Two numbers for contact [phone] or [phone].

## 2016-07-06 ENCOUNTER — Encounter: Payer: No Typology Code available for payment source | Admitting: Respiratory Therapy

## 2016-07-06 DIAGNOSIS — Z951 Presence of aortocoronary bypass graft: Secondary | ICD-10-CM | POA: Diagnosis not present

## 2016-07-06 NOTE — Progress Notes (Signed)
Daily Session Note  Patient Details  Name: Scott AMON Sr. MRN: [id number] Date of Birth: 1945/09/16 Referring Provider:     Cardiac Rehab from 04/12/2016 in [facility] Cardiac and Pulmonary Rehab  Referring Provider  Frankey Poot MD      Encounter Date: 07/06/2016  Check In:     Session Check In - 07/06/16 0823      Check-In   Location [facility]-Cardiac & Pulmonary Rehab   Staff Present Alberteen Sam, MA, ACSM RCEP, Exercise Physiologist;Laureen Owens Shark, BS, RRT, Respiratory Therapist;Joseph Hood RCP,RRT,BSRT;Heath Lark, RN, BSN, CCRP   Supervising physician immediately available to respond to emergencies See telemetry face sheet for immediately available ER MD   Medication changes reported     No   Fall or balance concerns reported    No   Tobacco Cessation No Change   Warm-up and Cool-down Performed on first and last piece of equipment   Resistance Training Performed Yes   VAD Patient? No     Pain Assessment   Currently in Pain? No/denies   Multiple Pain Sites No         History  Smoking Status   Never Smoker  Smokeless Tobacco   Never Used    Goals Met:  Proper associated with RPD/PD & O2 Sat Independence with exercise equipment Exercise tolerated well No report of cardiac concerns or symptoms Strength training completed today  Goals Unmet:  Not Applicable  Comments: Pt able to follow exercise prescription today without complaint.  Will continue to monitor for progression.   Dr. Emily Filbert is Medical Director for [place] and [facility].

## 2016-07-07 ENCOUNTER — Encounter: Payer: Self-pay | Admitting: *Deleted

## 2016-07-07 DIAGNOSIS — Z951 Presence of aortocoronary bypass graft: Secondary | ICD-10-CM

## 2016-07-07 NOTE — Progress Notes (Signed)
Cardiac Individual Treatment Plan  Patient Details  Name: Scott ISHAM Sr. MRN: [id number] Date of Birth: November 28, 1945 Referring Provider:     Cardiac Rehab from 04/12/2016 in [facility] Cardiac and Pulmonary Rehab  Referring Provider  Hinderliter, Antony Haste MD      Initial Encounter Date:    Cardiac Rehab from 04/12/2016 in [facility] Cardiac and Pulmonary Rehab  Date  04/12/16  Referring Provider  Hinderliter, Antony Haste MD      Visit Diagnosis: S/P CABG (coronary artery bypass graft)  Patient's Home Medications on Admission:  Current Outpatient Prescriptions:  .  acetaminophen (TYLENOL) 500 MG tablet, Take 1,000 mg by mouth 3 (three) times daily. , Disp: , Rfl:  .  albuterol (PROVENTIL HFA;VENTOLIN HFA) 108 (90 Base) MCG/ACT inhaler, Inhale 2 puffs into the lungs every 6 (six) hours as needed for wheezing., Disp: , Rfl:  .  amiodarone (PACERONE) 200 MG tablet, Take 200 mg by mouth daily., Disp: , Rfl:  .  apixaban (ELIQUIS) 2.5 MG TABS tablet, Take 2.5 mg by mouth 2 (two) times daily., Disp: , Rfl:  .  aspirin EC 81 MG tablet, Take 81 mg by mouth., Disp: , Rfl:  .  atorvastatin (LIPITOR) 80 MG tablet, Take 80 mg by mouth daily., Disp: , Rfl:  .  Calcium Carbonate-Vitamin D 600-400 MG-UNIT tablet, Take 1 tablet by mouth 2 (two) times daily with a meal., Disp: , Rfl:  .  cephALEXin (KEFLEX) 500 MG capsule, Take 1 capsule (500 mg total) by mouth 4 (four) times daily., Disp: 40 capsule, Rfl: 0 .  cetirizine (ZYRTEC) 10 MG tablet, Take 10 mg by mouth daily., Disp: , Rfl:  .  FLUoxetine (PROZAC) 20 MG tablet, Take 80 mg by mouth daily., Disp: , Rfl:  .  gabapentin (NEURONTIN) 300 MG capsule, Take 600 mg by mouth 3 (three) times daily., Disp: , Rfl:  .  glucosamine-chondroitin 500-400 MG tablet, Take 1 tablet by mouth 2 (two) times daily., Disp: , Rfl:  .  hydrocortisone 2.5 % cream, Apply 1 application topically 2 (two) times daily., Disp: , Rfl:  .  lisinopril (PRINIVIL,ZESTRIL) 2.5 MG tablet, Take 2.5 mg by  mouth daily., Disp: , Rfl:  .  magnesium oxide (MAG-OX) 400 MG tablet, Take 400 mg by mouth 2 (two) times daily., Disp: , Rfl:  .  metFORMIN (GLUCOPHAGE) 500 MG tablet, Take 500 mg by mouth 2 (two) times daily with a meal., Disp: , Rfl:  .  metoprolol succinate (TOPROL-XL) 25 MG 24 hr tablet, Take 12.5 mg by mouth daily. , Disp: , Rfl:  .  mirtazapine (REMERON) 15 MG tablet, Take 45 mg by mouth at bedtime., Disp: , Rfl:  .  pramipexole (MIRAPEX) 1 MG tablet, Take 0.5 mg by mouth every evening., Disp: , Rfl:  .  prochlorperazine (COMPAZINE) 5 MG tablet, Take 10 mg by mouth every 8 (eight) hours as needed for nausea., Disp: , Rfl:  .  ranitidine (ZANTAC) 150 MG tablet, Take 150 mg by mouth 2 (two) times daily., Disp: , Rfl:   Past Medical History: Past Medical History:  Diagnosis Date  . COPD (chronic obstructive pulmonary disease) ([place])   . Diabetes mellitus without complication ([place])   . Hypertension     Tobacco Use: History  Smoking Status  . Never Smoker  Smokeless Tobacco  . Never Used    Labs: Recent Review Flowsheet Data    There is no flowsheet data to display.       Exercise Target Goals:  Exercise Program Goal: Individual exercise prescription set with THRR, safety & activity barriers. Participant demonstrates ability to understand and report RPE using BORG scale, to self-measure pulse accurately, and to acknowledge the importance of the exercise prescription.  Exercise Prescription Goal: Starting with aerobic activity 30 plus minutes a day, 3 days per week for initial exercise prescription. Provide home exercise prescription and guidelines that participant acknowledges understanding prior to discharge.  Activity Barriers & Risk Stratification:     Activity Barriers & Cardiac Risk Stratification - 04/12/16 1308      Activity Barriers & Cardiac Risk Stratification   Activity Barriers Arthritis;Back Problems;Joint Problems;Deconditioning;Muscular  Weakness;Shortness of Breath;Assistive Device;History of Falls;Balance Concerns  fractured hip in [employer]   Cardiac Risk Stratification High      6 Minute Walk:     6 Minute Walk    Row Name 04/12/16 1532         6 Minute Walk   Phase Initial     Distance 1130 feet     Walk Time 6 minutes     # of Rest Breaks 0     MPH 2.14     METS 2.47     RPE 15     Perceived Dyspnea  4     VO2 Peak 8.66     Symptoms Yes (comment)     Comments SOB     Resting HR 77 bpm     Resting BP 180/80  recheck 134/74     Max Ex. HR 93 bpm     Max Ex. BP 186/74     2 Minute Post BP 146/74        Oxygen Initial Assessment:   Oxygen Re-Evaluation:   Oxygen Discharge (Final Oxygen Re-Evaluation):   Initial Exercise Prescription:     Initial Exercise Prescription - 04/12/16 1500      Date of Initial Exercise RX and Referring Provider   Date 04/12/16   Referring Provider Hinderliter, Alan MD     Treadmill   MPH 1.8   Grade 0.5   Minutes 15   METs 2.5     NuStep   Level 2   SPM 80   Minutes 15   METs 2     Biostep-RELP   Level 3   SPM 50   Minutes 15   METs 2     Prescription Details   Frequency (times per week) 2   Duration Progress to 45 minutes of aerobic exercise without signs/symptoms of physical distress     Intensity   THRR 40-80% of Max Heartrate 106-135   Ratings of Perceived Exertion 11-13   Perceived Dyspnea 0-4     Progression   Progression Continue to progress workloads to maintain intensity without signs/symptoms of physical distress.     Resistance Training   Training Prescription Yes   Weight 2 lbs   Reps 10-15      Perform Capillary Blood Glucose checks as needed.  Exercise Prescription Changes:     Exercise Prescription Changes    Row Name 04/22/16 1400 05/07/16 1500 05/18/16 1500 06/02/16 1000 06/17/16 1500     Response to Exercise   Blood Pressure (Admit) 126/80 150/82 138/78 126/76 134/72   Blood Pressure (Exercise) 128/62 152/74  134/72 122/69 134/64   Blood Pressure (Exit) 114/56 128/74 110/68 126/70 122/60   Heart Rate (Admit) 74 bpm 71 bpm 87 bpm 79 bpm 79 bpm   Heart Rate (Exercise) 103 bpm 99 bpm 111 bpm 103 bpm 107 bpm  Heart Rate (Exit) 94 bpm 77 bpm 85 bpm 74 bpm 86 bpm   Rating of Perceived Exertion (Exercise) '13 13 13 12 14   '$ Symptoms shortness of breath shortness of breath on the treadmill shortness of breath on the treadmill shortness of breath on the treadmill shortness of breath on the treadmill   Duration Progress to 45 minutes of aerobic exercise without signs/symptoms of physical distress Progress to 45 minutes of aerobic exercise without signs/symptoms of physical distress Continue with 45 min of aerobic exercise without signs/symptoms of physical distress. Continue with 45 min of aerobic exercise without signs/symptoms of physical distress. Continue with 45 min of aerobic exercise without signs/symptoms of physical distress.   Intensity THRR unchanged THRR unchanged THRR unchanged THRR unchanged THRR unchanged     Progression   Progression Continue to progress workloads to maintain intensity without signs/symptoms of physical distress. Continue to progress workloads to maintain intensity without signs/symptoms of physical distress. Continue to progress workloads to maintain intensity without signs/symptoms of physical distress. Continue to progress workloads to maintain intensity without signs/symptoms of physical distress. Continue to progress workloads to maintain intensity without signs/symptoms of physical distress.   Average METs 2.7 2.3 2.67 2.3 2.97     Resistance Training   Training Prescription Yes Yes Yes Yes Yes   Weight 3 lbs 3 lbs 4 lbs 4 lbs 4 lbs   Reps 10-15 10-15 10-15 10-15 10-15     Interval Training   Interval Training No No No No No     Treadmill   MPH 1.8 1.8 1.8 1.8 1.8   Grade 0.5 0.5 0.5 0.5 0.5   Minutes '15 15 15 15 15   '$ METs 2.5 2.5 2.5 2.5 2.5     NuStep   Level '1 2  3 3 3   '$ SPM 80 100 100  -  -   Minutes '15 15 15 15 15   '$ METs 3.6 2.5 2.5 2.4 3.4     Biostep-RELP   Level '1 1 1 3 3   '$ SPM 50 50  -  -  -   Minutes '15 15 15 15 15   '$ METs '2 2 3 2 3     '$ Home Exercise Plan   Plans to continue exercise at  - Home (comment)  walking Home (comment)  walking Home (comment)  walking Home (comment)  walking   Frequency  - Add 2 additional days to program exercise sessions. Add 2 additional days to program exercise sessions. Add 2 additional days to program exercise sessions. Add 2 additional days to program exercise sessions.   Initial Home Exercises Provided  - 05/06/16 05/06/16 05/06/16 05/06/16   Row Name 06/30/16 1600             Response to Exercise   Blood Pressure (Admit) 132/60       Blood Pressure (Exercise) 140/74       Blood Pressure (Exit) 104/68       Heart Rate (Admit) 88 bpm       Heart Rate (Exercise) 117 bpm       Heart Rate (Exit) 81 bpm       Rating of Perceived Exertion (Exercise) 13       Symptoms shortness of breath on the treadmill  dizzy spell on treadmill       Duration Continue with 45 min of aerobic exercise without signs/symptoms of physical distress.       Intensity THRR unchanged  Progression   Progression Continue to progress workloads to maintain intensity without signs/symptoms of physical distress.       Average METs 3.07         Resistance Training   Training Prescription Yes       Weight 4 lbs       Reps 10-15         Interval Training   Interval Training No         Treadmill   MPH 1.8       Grade 0.5       Minutes 15       METs 2.5         NuStep   Level 3       Minutes 15       METs 3.7         Biostep-RELP   Level 3       Minutes 15       METs 3         Home Exercise Plan   Plans to continue exercise at Home (comment)  walking       Frequency Add 2 additional days to program exercise sessions.       Initial Home Exercises Provided 05/06/16          Exercise Comments:      Exercise Comments    Row Name 04/20/16 1610 04/22/16 1433 06/29/16 1206       Exercise Comments First full day of exercise!  Patient was oriented to gym and equipment including functions, settings, policies, and procedures.  Patient's individual exercise prescription and treatment plan were reviewed.  All starting workloads were established based on the results of the 6 minute walk test done at initial orientation visit.  The plan for exercise progression was also introduced and progression will be customized based on patient's performance and goals. His weight was up two pounds today and he was encouraged to contact his doctor.  He was complaining of increased shortness of breath and swelling.  He says that his shortness of breath was not new. Scott Acevedo had a dizzy spell on the treadmill.  He was fine on the NuStep, but after 9 minutes on the treadmill he got dizzy and lightheaded. His blood pressure had dropped to below 90 mmHg. His blood sugar was fine (155 mg/dl). He skipped breakfast but did eat a pack of crackers during education.  He was given water and feet elevated blood pressure did recover to just above 100 mmHG.  He then tried to stand and got dizzy and blood pressure had dropped again. He was given more water and eventually did recover to above 100 mmHg and was asymptomatic.  He was given his blood pressures and encouraged to call his cardiologist at the [place].        Exercise Goals and Review:     Exercise Goals    Row Name 04/12/16 1536             Exercise Goals   Increase Physical Activity Yes       Intervention Provide advice, education, support and counseling about physical activity/exercise needs.;Develop an individualized exercise prescription for aerobic and resistive training based on initial evaluation findings, risk stratification, comorbidities and participant's personal goals.       Expected Outcomes Achievement of increased cardiorespiratory fitness and enhanced  flexibility, muscular endurance and strength shown through measurements of functional capacity and personal statement of participant.       Increase Strength  and Stamina Yes  more energy and strength       Intervention Provide advice, education, support and counseling about physical activity/exercise needs.;Develop an individualized exercise prescription for aerobic and resistive training based on initial evaluation findings, risk stratification, comorbidities and participant's personal goals.       Expected Outcomes Achievement of increased cardiorespiratory fitness and enhanced flexibility, muscular endurance and strength shown through measurements of functional capacity and personal statement of participant.          Exercise Goals Re-Evaluation :     Exercise Goals Re-Evaluation    Row Name 04/22/16 1431 05/06/16 1042 05/07/16 1550 05/18/16 1523 06/02/16 1035     Exercise Goal Re-Evaluation   Exercise Goals Review Increase Physical Activity;Increase Strenth and Stamina Increase Physical Activity;Increase Strenth and Stamina Increase Physical Activity;Increase Strenth and Stamina Increase Physical Activity;Increase Strenth and Stamina Increase Physical Activity;Increase Strenth and Stamina   Comments Scott Acevedo has completed two full days of exercise.  He is off to a good start.  We will continue to monitor his progression. Reviewed home exercise with pt today.  Pt plans to walk at home for exercise.  Reviewed THR, pulse, RPE, sign and symptoms, and when to call 911 or MD.  Also discussed weather considerations and indoor options.  Pt voiced understanding  He is already doing two days a week at home.  He has been able to tell a difference in how he is feeling as his strength has started to improve.  We will continue to work with him. Greely has been doing well in rehab.  We have discussed using this time for himself and letting us know how exercise really is going for him.  He has SOB on the treadmill  and usually doesn't want [place] to know about it. Emidio continues to do well in rehab.  He seems to be enjoying coming to classes.  He is now up to 3.0 METs on the BioStep.  We will be moving him up on that soon!  He has also increased his weights up to 4lbs.  We will continue to monitor his progression. Scott Acevedo has been doing well in rehab.  He has missed the last week due to doctor's appointments for him and his wife.  He is up to level 2 now on the BioStep.  We will continue to monitor his progress.   Expected Outcomes Short and Long: Sherrick will attend classes to work on strength and stamina. Short: Continue to exercise two days a week at home consistently.  Long: Add in thrid day at home and continue to work on increasing his stamina. Short: Add in his home exercise consistently.  Long: Continue to come to class to work on strength and stamina Short: Increase workload on the BioStep and increase spm on NuStep.  Long: Continue to come to class and add in exercise at home. Short: Increase speed on treadmill.  Long: Continue to exercise regularly.   Row Name 06/03/16 1025 06/17/16 1523 06/30/16 1612         Exercise Goal Re-Evaluation   Exercise Goals Review Increase Physical Activity;Increase Strenth and Stamina Increase Physical Activity;Increase Strenth and Stamina Increase Physical Activity;Increase Strenth and Stamina     Comments Scott Acevedo can tell the difference that rehab is making for him.  He can tell that he has increased his stamina and his endurance.  He is also less short of breath than when he started.  He is walking some with his wife and doing some squats  to build up strength at home.   Scott Acevedo has been doing well in rehab.  He is feeling better.  He is not consistent in his home exercise as he has said that it is hard to walk with his shortness of breath.  He is up to 3.4 METs on the NuStep!  We will continue to monitor his progression. Scott Acevedo continues to do well in rehab.  He did have an episode of  orthostatic blood pressure drop on the treadmill.  He is up to 3 METs on the BioStep.  We will continue to monitor his progression.     Expected Outcomes Short: Increase time walking at home.  Long: Exercise more regularly Short: Try to increase treadmill some.  Long: Exercise more at home. Short: Increase BioStep workload.  Long: Exercise more at home.        Discharge Exercise Prescription (Final Exercise Prescription Changes):     Exercise Prescription Changes - 06/30/16 1600      Response to Exercise   Blood Pressure (Admit) 132/60   Blood Pressure (Exercise) 140/74   Blood Pressure (Exit) 104/68   Heart Rate (Admit) 88 bpm   Heart Rate (Exercise) 117 bpm   Heart Rate (Exit) 81 bpm   Rating of Perceived Exertion (Exercise) 13   Symptoms shortness of breath on the treadmill  dizzy spell on treadmill   Duration Continue with 45 min of aerobic exercise without signs/symptoms of physical distress.   Intensity THRR unchanged     Progression   Progression Continue to progress workloads to maintain intensity without signs/symptoms of physical distress.   Average METs 3.07     Resistance Training   Training Prescription Yes   Weight 4 lbs   Reps 10-15     Interval Training   Interval Training No     Treadmill   MPH 1.8   Grade 0.5   Minutes 15   METs 2.5     NuStep   Level 3   Minutes 15   METs 3.7     Biostep-RELP   Level 3   Minutes 15   METs 3     Home Exercise Plan   Plans to continue exercise at Home (comment)  walking   Frequency Add 2 additional days to program exercise sessions.   Initial Home Exercises Provided 05/06/16      Nutrition:  Target Goals: Understanding of nutrition guidelines, daily intake of sodium '1500mg'$ , cholesterol '200mg'$ , calories 30% from fat and 7% or less from saturated fats, daily to have 5 or more servings of fruits and vegetables.  Biometrics:     Pre Biometrics - 04/12/16 1536      Pre Biometrics   Height '6\' 1"'$  (1.854  m)   Weight 260 lb 12.8 oz (118.3 kg)   Waist Circumference 47 inches   Hip Circumference 47 inches   Waist to Hip Ratio 1 %   BMI (Calculated) 34.5   Single Leg Stand 9.28 seconds       Nutrition Therapy Plan and Nutrition Goals:     Nutrition Therapy & Goals - 05/11/16 1108      Nutrition Therapy   Diet Instructed patient accompanied by his wife on a meal plan based on 2000 calories including heart healthy dietary guidelines as well as diabetes dietary guidelines.   Drug/Food Interactions Statins/Certain Fruits   Protein (specify units) 8 ounces   Fiber 30 grams   Whole Grain Foods 3 servings   Saturated Fats 13 max. grams  Fruits and Vegetables 5 servings/day   Sodium 2000 grams  '1500mg'$  idfeal     Personal Nutrition Goals   Nutrition Goal Increase fruits and vegetables to at least 5 servings daily.   Personal Goal #2 Read labels for saturated fat, trans fat and sodium   Personal Goal #3 Rinse canned vegetables; use fresh or frozen whenever possible.   Personal Goal #4 Balance meals with protein, 3-4 servings of carbohydrate and non-starchy vegetables. Refer to "Diabetes Plate" hand-out and "Planning a Balanced Meal" handout.     Intervention Plan   Intervention Prescribe, educate and counsel regarding individualized specific dietary modifications aiming towards targeted core components such as weight, hypertension, lipid management, diabetes, heart failure and other comorbidities.;Nutrition handout(s) given to patient.   Expected Outcomes Short Term Goal: Understand basic principles of dietary content, such as calories, fat, sodium, cholesterol and nutrients.;Short Term Goal: A plan has been developed with personal nutrition goals set during dietitian appointment.;Long Term Goal: Adherence to prescribed nutrition plan.      Nutrition Discharge: Rate Your Plate Scores:   Nutrition Goals Re-Evaluation:     Nutrition Goals Re-Evaluation    Row Name 05/06/16 1054  06/03/16 1039 06/29/16 1119         Goals   Nutrition Goal  - Increase fruits and vegetables to at least 5 servings daily. Read labels, Rinse canned vegetables.  More protein. Increase fruits and vegetables to at least 5 servings daily. Read labels, Rinse canned vegetables.       Comment Scott Acevedo has started to work on his diet some.  He is trying to stay away from white grains.  He has an appointment to meet with the dietician on Tuesday April 24. Scott Acevedo has increased his intake of fruits and vegetables, but his weakness is a tomato sandwich.  They are reading labels.  His wife has started to rinse some vegetables, but not all of them yet.  They are working on more protein, but still need more.  They are still eating white grains and was encouraged to switch to whole grains. Scott Acevedo had forgotten about rinsing the canned vegatbles.  Discussed with him how it can lower the sodium content of the food item.  He will start rinsing canned foods.  Scott Acevedo states that his wife is working on keeping his meals balanced for his diabetes.      Expected Outcome Set nutrition goals. Short: Eat more protein and watch sugars and simple carbs.  Long: Eat a heart healthy diet. Short term goal:Continue to work on healthy eating habits, begin to rinse the canned vegeatable if he does not eat fresh or frozen vegetables. Continue to work on balanced meal plans to help with diabetes control. Long Term GOal: Maintianing healthy eating habits       Personal Goal #2 Re-Evaluation   Personal Goal #2  -  - Read labels for saturated fat, trans fat and sodium       Personal Goal #3 Re-Evaluation   Personal Goal #3  -  - Rinse canned vegetables; use fresh or frozen whenever possible.       Personal Goal #4 Re-Evaluation   Personal Goal #4  -  - Balance meals with protein, 3-4 servings of carbohydrate and non-starchy vegetables. Refer to "Diabetes Plate" hand-out and "Planning a Balanced Meal" handout.        Nutrition Goals  Discharge (Final Nutrition Goals Re-Evaluation):     Nutrition Goals Re-Evaluation - 06/29/16 1119      Goals  Nutrition Goal Increase fruits and vegetables to at least 5 servings daily. Read labels, Rinse canned vegetables.     Comment Scott Acevedo had forgotten about rinsing the canned vegatbles.  Discussed with him how it can lower the sodium content of the food item.  He will start rinsing canned foods.  Scott Acevedo states that his wife is working on keeping his meals balanced for his diabetes.    Expected Outcome Short term goal:Continue to work on healthy eating habits, begin to rinse the canned vegeatable if he does not eat fresh or frozen vegetables. Continue to work on balanced meal plans to help with diabetes control. Long Term GOal: Maintianing healthy eating habits     Personal Goal #2 Re-Evaluation   Personal Goal #2 Read labels for saturated fat, trans fat and sodium     Personal Goal #3 Re-Evaluation   Personal Goal #3 Rinse canned vegetables; use fresh or frozen whenever possible.     Personal Goal #4 Re-Evaluation   Personal Goal #4 Balance meals with protein, 3-4 servings of carbohydrate and non-starchy vegetables. Refer to "Diabetes Plate" hand-out and "Planning a Balanced Meal" handout.      Psychosocial: Target Goals: Acknowledge presence or absence of significant depression and/or stress, maximize coping skills, provide positive support system. Participant is able to verbalize types and ability to use techniques and skills needed for reducing stress and depression.   Initial Review & Psychosocial Screening:     Initial Psych Review & Screening - 04/12/16 1311      Initial Review   Current issues with Current Stress Concerns   Source of Stress Concerns Chronic Illness;Occupation     Family Dynamics   [place]? Yes     Barriers   Psychosocial barriers to participate in program The patient should benefit from training in stress management and relaxation.      Screening Interventions   Interventions Yes;Encouraged to exercise;Program counselor consult   Expected Outcomes Short Term goal: Utilizing psychosocial counselor, staff and physician to assist with identification of specific Stressors or current issues interfering with healing process. Setting desired goal for each stressor or current issue identified.;Long Term Goal: Stressors or current issues are controlled or eliminated.;Short Term goal: Identification and review with participant of any Quality of Life or Depression concerns found by scoring the questionnaire.      Quality of Life Scores:    PHQ-9: Recent Review Flowsheet Data    Depression screen [facility] 2/9 04/12/2016   Decreased Interest 0   Down, Depressed, Hopeless 2   PHQ - 2 Score 2   Altered sleeping 2   Tired, decreased energy 3   Change in appetite 0   Feeling bad or failure about yourself  1   Trouble concentrating 2   Moving slowly or fidgety/restless 0   Suicidal thoughts 1   PHQ-9 Score 11   Difficult doing work/chores Somewhat difficult     Interpretation of Total Score  Total Score Depression Severity:  1-4 = Minimal depression, 5-9 = Mild depression, 10-14 = Moderate depression, 15-19 = Moderately severe depression, 20-27 = Severe depression   Psychosocial Evaluation and Intervention:     Psychosocial Evaluation - 06/10/16 0923      Psychosocial Evaluation & Interventions   Comments Counselor met with Scott Acevedo for initial psychosocial evaluation.  He is a 71 year old who had a heart attack the end of last year.  He also struggles with multiple health issues such as diabetes; COPD and sleep apnea.  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  Kolter reports some difficulty sleeping at night even with the use of his CPAP.  He is on medications for this that are helpful sometimes.  He reports a history of depression and is  on medication for that which he states is helpful.  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  He has goals for this program to get stronger and increase his endurance over all.  He will be followed with staff throughout the course of this program.   Expected Outcomes Issai will benefit from consistent exercise to achieve his stated goals.  He will also benefit from the psychoeducational components of this program - particularly stress management and depression.  Counselor and staff will be following with him.     Continue Psychosocial Services  Follow up required by staff      Psychosocial Re-Evaluation:     Psychosocial Re-Evaluation    Scott Acevedo Name 06/03/16 2831 06/29/16 1131           Psychosocial Re-Evaluation   Current issues with Current Sleep Concerns;Current Stress Concerns Current Sleep Concerns;Current Stress Concerns      Comments Dontre is still not sleeping very well despite being on 3 does of a medication.  He does want to talk to his physcian more about it.  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  He has also recently added that his wife has a broken arm currently and then his car died on his way into rehab today.  He continues to have a lot on his plate.  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  He is working on improving his self care.  Overall he is generally positive and has felt the program has been a blessing to him and eye opening. Scott Acevedo is waiting for a new CPAP mask to help with his sleep. He has a mask that does not fir well enough to stay on during the night.  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 He has joined a Surveyor, mining and looks forward to [employer].       Expected Outcomes Short: Continue to come to class, talk to doctor about sleep.  Long: Maintain postive outlook and his prayer time. Short: Continue to come to class, waiting for new CPAP mask.  Long: Maintain postive outlook, his stress relief activities of stamp collecting and his prayer time.      Interventions Encouraged to attend Cardiac Rehabilitation for the exercise;Stress management education  -      Continue Psychosocial Services  Follow up required by staff  -        Initial Review   Source of Stress Concerns Chronic Illness;Occupation  -         Psychosocial Discharge (Final Psychosocial Re-Evaluation):     Psychosocial Re-Evaluation -  06/29/16 1131      Psychosocial Re-Evaluation   Current issues with Current Sleep Concerns;Current Stress Concerns   Comments Scott Acevedo is waiting for a new CPAP mask to help with his sleep. He has a mask that does not fir well enough to stay on during the night.  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 He has joined a Surveyor, mining and looks forward to [employer].    Expected Outcomes Short: Continue to come to class, waiting for new CPAP mask.  Long: Maintain postive outlook, his stress relief activities of stamp collecting and his prayer time.      Vocational Rehabilitation: Provide vocational rehab assistance to qualifying candidates.   Vocational Rehab Evaluation & Intervention:     Vocational Rehab - 04/12/16 1255      Initial Vocational Rehab Evaluation & Intervention   Assessment shows need for Vocational Rehabilitation No       Education: Education Goals: Education classes will be provided on a weekly basis, covering required topics. Participant will state understanding/return demonstration of topics presented.  Learning Barriers/Preferences:     Learning Barriers/Preferences - 04/12/16 1255      Learning Barriers/Preferences   Learning Barriers Sight;Hearing   Learning Preferences None      Education Topics: General Nutrition Guidelines/Fats and Fiber: -Group instruction provided by verbal, written material, models and posters to present the general guidelines for heart healthy nutrition. Gives an explanation and review of dietary fats and fiber.   Controlling Sodium/Reading Food Labels: -Group verbal and written material supporting the discussion of sodium use in heart healthy nutrition. Review and explanation with models, verbal and written materials for utilization of the food label.   Cardiac Rehab from 07/06/2016 in [facility] Cardiac and Pulmonary Rehab  Date  05/25/16  Educator  PI  Instruction Review Code  2- meets goals/outcomes      Exercise Physiology & Risk Factors: - Group verbal and written instruction with models to review the exercise physiology of the cardiovascular system and associated critical values. Details cardiovascular disease risk factors and the goals associated with each risk factor.   Aerobic Exercise & Resistance Training: - Gives group verbal and written discussion on the health impact of inactivity. On the components of aerobic and resistive training programs and the benefits of this training and how to safely progress through these programs.   Cardiac Rehab from 07/06/2016 in [facility] Cardiac and Pulmonary Rehab  Date  06/03/16  Educator  [facility]  Instruction Review Code  2- meets goals/outcomes      Flexibility, Balance, General Exercise Guidelines: - Provides group verbal and written instruction on the benefits of flexibility and balance training programs. Provides  general exercise guidelines with specific guidelines to those with heart or lung disease. Demonstration and skill practice provided.   Cardiac Rehab from 07/06/2016 in [facility] Cardiac and Pulmonary Rehab  Date  06/08/16  Educator  [facility]  Instruction Review Code  2- meets goals/outcomes      Stress Management: - Provides group verbal and written instruction about the health risks of elevated stress, cause of high stress, and healthy ways to reduce stress.   Cardiac Rehab from 07/06/2016 in [facility] Cardiac and Pulmonary Rehab  Date  06/15/16  Educator  [facility]  Instruction  Review Code  2- meets goals/outcomes      Depression: - Provides group verbal and written instruction on the correlation between heart/lung disease and depressed mood, treatment options, and the stigmas associated with seeking treatment.   Anatomy & Physiology of the Heart: - Group verbal and written instruction and models provide basic cardiac anatomy and physiology, with the coronary electrical and arterial systems. Review of: AMI, Angina, Valve disease, Heart Failure, Cardiac Arrhythmia, Pacemakers, and the ICD.   Cardiac Rehab from 07/06/2016 in [facility] Cardiac and Pulmonary Rehab  Date  05/20/16  Educator  [place]  Instruction Review Code  2- meets goals/outcomes      Cardiac Procedures: - Group verbal and written instruction and models to describe the testing methods done to diagnose heart disease. Reviews the outcomes of the test results. Describes the treatment choices: Medical Management, Angioplasty, or Coronary Bypass Surgery.   Cardiac Rehab from 07/06/2016 in [facility] Cardiac and Pulmonary Rehab  Date  06/22/16  Educator  SB  Instruction Review Code  2- meets goals/outcomes      Cardiac Medications: - Group verbal and written instruction to review commonly prescribed medications for heart disease. Reviews the medication, class of the drug, and side effects. Includes the steps to properly store meds and maintain the  prescription regimen.   Cardiac Rehab from 07/06/2016 in [facility] Cardiac and Pulmonary Rehab  Date  06/29/16 [6/12 PART 1]  Educator  SB  Instruction Review Code  2- meets goals/outcomes      Go Sex-Intimacy & Heart Disease, Get SMART - Goal Setting: - Group verbal and written instruction through game format to discuss heart disease and the return to sexual intimacy. Provides group verbal and written material to discuss and apply goal setting through the application of the S.M.A.R.T. Method.   Cardiac Rehab from 07/06/2016 in [facility] Cardiac and Pulmonary Rehab  Date  06/22/16  Educator  SB  Instruction Review Code  2- meets goals/outcomes      Other Matters of the Heart: - Provides group verbal, written materials and models to describe Heart Failure, Angina, Valve Disease, and Diabetes in the realm of heart disease. Includes description of the disease process and treatment options available to the cardiac patient.   Cardiac Rehab from 07/06/2016 in [facility] Cardiac and Pulmonary Rehab  Date  04/22/16  Educator  SB  Instruction Review Code  2- meets goals/outcomes      Exercise & Equipment Safety: - Individual verbal instruction and demonstration of equipment use and safety with use of the equipment.   Cardiac Rehab from 07/06/2016 in [facility] Cardiac and Pulmonary Rehab  Date  04/12/16  Educator  C. EnterkinRN  Instruction Review Code  1- partially meets, needs review/practice      Infection Prevention: - Provides verbal and written material to individual with discussion of infection control including proper hand washing and proper equipment cleaning during exercise session.   Cardiac Rehab from 07/06/2016 in [facility] Cardiac and Pulmonary Rehab  Date  04/12/16  Educator  C. Enterkin,RN  Instruction Review Code  2- meets goals/outcomes      Falls Prevention: - Provides verbal and written material to individual with discussion of falls prevention and safety.   Cardiac Rehab from 07/06/2016 in  [facility] Cardiac and Pulmonary Rehab  Date  04/12/16  Educator  C. [place]  Instruction Review Code  1- partially meets, needs review/practice      Diabetes: - Individual verbal and written instruction to review signs/symptoms of diabetes, desired ranges of  glucose level fasting, after meals and with exercise. Advice that pre and post exercise glucose checks will be done for 3 sessions at entry of program.   Cardiac Rehab from 07/06/2016 in [facility] Cardiac and Pulmonary Rehab  Date  04/12/16  Educator  C. [place]  Instruction Review Code  1- partially meets, needs review/practice       Knowledge Questionnaire Score:     Knowledge Questionnaire Score - 04/12/16 1255      Knowledge Questionnaire Score   Pre Score 18/28      Core Components/Risk Factors/Patient Goals at Admission:     Personal Goals and Risk Factors at Admission - 04/12/16 1308      Core Components/Risk Factors/Patient Goals on Admission    Weight Management Yes;Obesity;Weight Maintenance   Intervention Weight Management: Develop a combined nutrition and exercise program designed to reach desired caloric intake, while maintaining appropriate intake of nutrient and fiber, sodium and fats, and appropriate energy expenditure required for the weight goal.;Weight Management: Provide education and appropriate resources to help participant work on and attain dietary goals.;Weight Management/Obesity: Establish reasonable short term and long term weight goals.;Obesity: Provide education and appropriate resources to help participant work on and attain dietary goals.   Admit Weight 260 lb 12.8 oz (118.3 kg)   Goal Weight: Short Term 230 lb (104.3 kg)   Goal Weight: Long Term 185 lb (83.9 kg)   Expected Outcomes Short Term: Continue to assess and modify interventions until short term weight is achieved;Weight Loss: Understanding of general recommendations for a balanced deficit meal plan, which promotes 1-2 lb weight loss per  week and includes a negative energy balance of [phone] kcal/d;Understanding recommendations for meals to include 15-35% energy as protein, 25-35% energy from fat, 35-60% energy from carbohydrates, less than '200mg'$  of dietary cholesterol, 20-35 gm of total fiber daily;Understanding of distribution of calorie intake throughout the day with the consumption of 4-5 meals/snacks   Improve shortness of breath with ADL's Yes   Intervention Provide education, individualized exercise plan and daily activity instruction to help decrease symptoms of SOB with activities of daily living.   Expected Outcomes Short Term: Achieves a reduction of symptoms when performing activities of daily living.   Develop more efficient breathing techniques such as purse lipped breathing and diaphragmatic breathing; and practicing self-pacing with activity Yes   Intervention Provide education, demonstration and support about specific breathing techniuqes utilized for more efficient breathing. Include techniques such as pursed lipped breathing, diaphragmatic breathing and self-pacing activity.   Expected Outcomes Short Term: Participant will be able to demonstrate and use breathing techniques as needed throughout daily activities.   Diabetes Yes   Intervention Provide education about signs/symptoms and action to take for hypo/hyperglycemia.;Provide education about proper nutrition, including hydration, and aerobic/resistive exercise prescription along with prescribed medications to achieve blood glucose in normal ranges: Fasting glucose 65-99 mg/dL   Expected Outcomes Short Term: Participant verbalizes understanding of the signs/symptoms and immediate care of hyper/hypoglycemia, proper foot care and importance of medication, aerobic/resistive exercise and nutrition plan for blood glucose control.;Long Term: Attainment of HbA1C < 7%.   Hypertension Yes   Intervention Monitor prescription use compliance.;Provide education on lifestyle  modifcations including regular physical activity/exercise, weight management, moderate sodium restriction and increased consumption of fresh fruit, vegetables, and low fat dairy, alcohol moderation, and smoking cessation.   Expected Outcomes Short Term: Continued assessment and intervention until BP is < 140/70m HG in hypertensive participants. < 130/867mHG in hypertensive participants with diabetes, heart failure  or chronic kidney disease.;Long Term: Maintenance of blood pressure at goal levels.   Lipids Yes   Intervention Provide education and support for participant on nutrition & aerobic/resistive exercise along with prescribed medications to achieve LDL '70mg'$ , HDL >'40mg'$ .   Expected Outcomes Short Term: Participant states understanding of desired cholesterol values and is compliant with medications prescribed. Participant is following exercise prescription and nutrition guidelines.;Long Term: Cholesterol controlled with medications as prescribed, with individualized exercise RX and with personalized nutrition plan. Value goals: LDL < '70mg'$ , HDL > 40 mg.   Stress Yes   Intervention Offer individual and/or small group education and counseling on adjustment to heart disease, stress management and health-related lifestyle change. Teach and support self-help strategies.;Refer participants experiencing significant psychosocial distress to appropriate mental health specialists for further evaluation and treatment. When possible, include family members and significant others in education/counseling sessions.   Expected Outcomes Short Term: Participant demonstrates changes in health-related behavior, relaxation and other stress management skills, ability to obtain effective social support, and compliance with psychotropic medications if prescribed.;Long Term: Emotional wellbeing is indicated by absence of clinically significant psychosocial distress or social isolation.      Core Components/Risk  Factors/Patient Goals Review:      Goals and Risk Factor Review    Row Name 05/06/16 1043 05/11/16 1036 06/03/16 1034 06/29/16 1124       Core Components/Risk Factors/Patient Goals Review   Personal Goals Review Weight Management/Obesity;Develop more efficient breathing techniques such as purse lipped breathing and diaphragmatic breathing and practicing self-pacing with activity.;Improve shortness of breath with ADL's;Diabetes;Hypertension;Lipids Weight Management/Obesity Weight Management/Obesity;Hypertension;Diabetes;Lipids Weight Management/Obesity;Improve shortness of breath with ADL's;Diabetes;Hypertension;Lipids    Review Sally has been doing well in rehab.  He is still struggling with weight gain and shortness of breath.  He went to the [place] last week and they told him to keep exercising and drink plenty of water.  Since his weight was up again today and his breathing continues to not improve, I encouraged him to call them again to let them know he has gained 4 lbs in a week.  We did review how to do pursed lip and diaphragmatic breathing to help with breath control during activity.  His blood sugars and statins have been doing well and stay stable. However, his blood pressures continue to be all over the map, no consistentsy.  He says that he is checking it at home and keeping a log which I encouraged him to show to his doctor at his next visit.   He talked to his doctor last week about his weight gain.  They did not feel that it was heart failure, but rather that he needed to control his eating and portion sizes better. Scott Acevedo went to ER last week and was found to have fluid in his lungs.  He started Lasix and is feeling better.  His blood pressures are good and stable.  Blood sugars have beena little high, but trending down.  He is still eating more sweets than he should.  No problems with his statins. Scott Acevedo is doinf well with weight today. Is  back at 260  lbs after having gained four pounds. He is  working on his nutrition goals and is doing well with some of the changes he made. Less butter and salt on his vegetables. BP,  Blood [place], Lipids are maintaining control with his meds, exercise and changes in his eating habits. Less sodium with his meals. HIs SOB is improving as he increases his exercise levels.  Scott Acevedo can see a difference with the SOB and his activities that the SOB is decreased. His MD has decreased the ampount of medications from 36 pills a day to 28 pills a day.     Expected Outcomes Short: Call doctor about shortness of breath and weight gain.  Long: Continue to work on weight loss, risk factors, and pursed lip breathing.   Short: Has appt with dietician today.  Long: Work on improved diet and control. Short: Continue to work on weight loss.  Long: Improve diet and control. Short: Continue to work on weight loss.  Long: Improve with use of nutrition plan. Continue with exercise routine for risk factor control and improved SOB symptoms.   Long TErm: Maintenance of exercise and nutrition plan for continued risk factor control       Core Components/Risk Factors/Patient Goals at Discharge (Final Review):      Goals and Risk Factor Review - 06/29/16 1124      Core Components/Risk Factors/Patient Goals Review   Personal Goals Review Weight Management/Obesity;Improve shortness of breath with ADL's;Diabetes;Hypertension;Lipids   Review Scott Acevedo is doinf well with weight today. Is  back at 260  lbs after having gained four pounds. He is working on his nutrition goals and is doing well with some of the changes he made. Less butter and salt on his vegetables. BP,  Blood [place], Lipids are maintaining control with his meds, exercise and changes in his eating habits. Less sodium with his meals. HIs SOB is improving as he increases his exercise levels. Scott Acevedo can see a difference with the SOB and his activities that the SOB is decreased. His MD has decreased the ampount of medications from 36 pills  a day to 28 pills a day.    Expected Outcomes Short: Continue to work on weight loss.  Long: Improve with use of nutrition plan. Continue with exercise routine for risk factor control and improved SOB symptoms.   Long TErm: Maintenance of exercise and nutrition plan for continued risk factor control      ITP Comments:     ITP Comments    Row Name 04/12/16 1305 04/14/16 0611 05/12/16 0552 05/18/16 0844 06/09/16 0855   ITP Comments ITP Created during Medical Review, Documenation of DX Veteran's Admin Notes which will be scanned.  30 day review. Continue with ITP unless directed changes per Medical Director review  New to program 30 day review. Continue with ITP unless directed changes per Medical Director review Scott Acevedo called out today due to another appointment. 30 day review. Continue with ITP unless directed changes per Medical Director review   [place] Name 06/29/16 1143 06/30/16 1406 07/07/16 0533       ITP Comments Scott Acevedo has episode of dizziness, swaety and weak after 9 minutes on the treadmil today.    BP under 90 systolic. Off the machine sat down  on chair, feet elevated onto another chair and given water to drink. BP up above 263 systolic.  More water, stood and BP dropped under 785 systolic. Sat down,given more water and finally able to stand with BP 112/62.  THis has been reported via call to the office of Dr Alanda Amass at [facility] in the [facility] Ms. Theador Hawthorne, PA called in reference to Scott Acevedo's BP drop yesterday.  She is his cardiologist.  She stated that he has a history of being orthostatic and thought it may have just been exaggerated yesterday since he had not eaten prior  to rehab.  She asked that if anything comes up in the future to let her know.  Two numbers for contact [phone] or [phone]. 30 day review. Continue with ITP unless directed changes per Medical Director review.  Reported one medication stopped and one dose cut in half after  talking to his [facility] MD about his episodes of dizziness.        Comments:

## 2016-07-08 ENCOUNTER — Telehealth: Payer: Self-pay

## 2016-07-08 DIAGNOSIS — Z951 Presence of aortocoronary bypass graft: Secondary | ICD-10-CM

## 2016-07-08 LAB — GLUCOSE, CAPILLARY: GLUCOSE-CAPILLARY: 114 mg/dL — AB (ref 65–99)

## 2016-07-08 NOTE — Telephone Encounter (Signed)
Spoke [facility]  who returned my call about Mr Scott Acevedo symptoms - she took him off Metoprolol 6/13 and lowered Furosemide to 10 mg.  She will call him about the continued symtpoms - he has chronic orthostatic issues and they are working with his meds.

## 2016-07-08 NOTE — Progress Notes (Signed)
Daily Session Note  Patient Details  Name: Scott EINSTEIN Sr. MRN: [id number] Date of Birth: 1945-09-15 Referring Provider:     Cardiac Rehab from 04/12/2016 in [facility]  Referring Provider  Frankey Poot MD      Encounter Date: 07/08/2016  Check In:     Session Check In - 07/08/16 0955      Check-In   Location [facility]-Cardiac & Pulmonary Rehab   Staff Present Alberteen Sam, MA, ACSM RCEP, Exercise Physiologist;Joseph Foy [place], [place], ACSM CEP, Exercise Physiologist;Meredith Sherryll Burger, RN BSN   Supervising physician immediately available to respond to emergencies See telemetry face sheet for immediately available ER MD   Medication changes reported     No   Fall or balance concerns reported    No   Warm-up and Cool-down Performed on first and last piece of equipment   Resistance Training Performed Yes   VAD Patient? No     Pain Assessment   Currently in Pain? No/denies         History  Smoking Status  . Never Smoker  Smokeless Tobacco  . Never Used    Goals Met:  Independence with exercise equipment Exercise tolerated well No report of cardiac concerns or symptoms Strength training completed today  Goals Unmet:  Not Applicable  Comments:      6 Minute Walk    Row Name 04/12/16 1532 07/08/16 0956       6 Minute Walk   Phase Initial Discharge    Distance 1130 feet 1312 feet    Walk Time 6 minutes 6 minutes    # of Rest Breaks 0 0    MPH 2.14 2.48    METS 2.47 2.63    RPE 15 13    Perceived Dyspnea  4  -    VO2 Peak 8.66 9.21    Symptoms Yes (comment) Yes (comment)    Comments SOB dizziness (BP dropped and BG was 114 after juice)    Resting HR 77 bpm 86 bpm    Resting BP 180/80  recheck 134/74 124/64    Max Ex. HR 93 bpm 118 bpm    Max Ex. BP 186/74 94/60    2 Minute Post BP 146/74  -         Dr. Emily Filbert is Medical Director for [facility].

## 2016-07-13 ENCOUNTER — Encounter: Payer: No Typology Code available for payment source | Admitting: *Deleted

## 2016-07-13 DIAGNOSIS — Z951 Presence of aortocoronary bypass graft: Secondary | ICD-10-CM | POA: Diagnosis not present

## 2016-07-13 NOTE — Patient Instructions (Signed)
Discharge Instructions  Patient Details  Name: Scott RandJames E Kluender Sr. MRN: [id number] Date of Birth: 09/02/1945 Referring Provider:  [facility]   Number of Visits: 36/36  Reason for Discharge:  Patient reached a stable level of exercise. Patient independent in their exercise.  Smoking History:  History  Smoking Status  . Never Smoker  Smokeless Tobacco  . Never Used    Diagnosis:  S/P CABG (coronary artery bypass graft)  Initial Exercise Prescription:     Initial Exercise Prescription - 04/12/16 1500      Date of Initial Exercise RX and Referring Provider   Date 04/12/16   Referring Provider Hinderliter, Alan MD     Treadmill   MPH 1.8   Grade 0.5   Minutes 15   METs 2.5     NuStep   Level 2   SPM 80   Minutes 15   METs 2     Biostep-RELP   Level 3   SPM 50   Minutes 15   METs 2     Prescription Details   Frequency (times per week) 2   Duration Progress to 45 minutes of aerobic exercise without signs/symptoms of physical distress     Intensity   THRR 40-80% of Max Heartrate 106-135   Ratings of Perceived Exertion 11-13   Perceived Dyspnea 0-4     Progression   Progression Continue to progress workloads to maintain intensity without signs/symptoms of physical distress.     Resistance Training   Training Prescription Yes   Weight 2 lbs   Reps 10-15      Discharge Exercise Prescription (Final Exercise Prescription Changes):     Exercise Prescription Changes - 06/30/16 1600      Response to Exercise   Blood Pressure (Admit) 132/60   Blood Pressure (Exercise) 140/74   Blood Pressure (Exit) 104/68   Heart Rate (Admit) 88 bpm   Heart Rate (Exercise) 117 bpm   Heart Rate (Exit) 81 bpm   Rating of Perceived Exertion (Exercise) 13   Symptoms shortness of breath on the treadmill  dizzy spell on treadmill   Duration Continue with 45 min of aerobic exercise without signs/symptoms of physical distress.   Intensity THRR unchanged     Progression   Progression Continue to progress workloads to maintain intensity without signs/symptoms of physical distress.   Average METs 3.07     Resistance Training   Training Prescription Yes   Weight 4 lbs   Reps 10-15     Interval Training   Interval Training No     Treadmill   MPH 1.8   Grade 0.5   Minutes 15   METs 2.5     NuStep   Level 3   Minutes 15   METs 3.7     Biostep-RELP   Level 3   Minutes 15   METs 3     Home Exercise Plan   Plans to continue exercise at Home (comment)  walking   Frequency Add 2 additional days to program exercise sessions.   Initial Home Exercises Provided 05/06/16      Functional Capacity:     6 Minute Walk    Row Name 04/12/16 1532 07/08/16 0956       6 Minute Walk   Phase Initial Discharge    Distance 1130 feet 1312 feet    Walk Time 6 minutes 6 minutes    # of Rest Breaks 0 0    MPH 2.14 2.48  METS 2.47 2.63    RPE 15 13    Perceived Dyspnea  4  -    VO2 Peak 8.66 9.21    Symptoms Yes (comment) Yes (comment)    Comments SOB dizziness (BP dropped and BG was 114 after juice)    Resting HR 77 bpm 86 bpm    Resting BP 180/80  recheck 134/74 124/64    Max Ex. HR 93 bpm 118 bpm    Max Ex. BP 186/74 94/60    2 Minute Post BP 146/74  -       Quality of Life:     Quality of Life - 07/13/16 0832      Quality of Life Scores   Health/Function Pre 15.57 %   Health/Function Post 26.8 %   Health/Function % Change 72.13 %   Socioeconomic Pre 21.44 %   Socioeconomic Post 20.57 %   Socioeconomic % Change  -4.06 %   Psych/Spiritual Pre 22.07 %   Psych/Spiritual Post 26.57 %   Psych/Spiritual % Change 20.39 %   Family Pre 19.2 %   Family Post 22.8 %   Family % Change 18.75 %   GLOBAL Pre 18.73 %   GLOBAL Post 26.88 %   GLOBAL % Change 43.51 %      Personal Goals: Goals established at orientation with interventions provided to work toward goal.     Personal Goals and Risk Factors at Admission - 04/12/16  1308      Core Components/Risk Factors/Patient Goals on Admission    Weight Management Yes;Obesity;Weight Maintenance   Intervention Weight Management: Develop a combined nutrition and exercise program designed to reach desired caloric intake, while maintaining appropriate intake of nutrient and fiber, sodium and fats, and appropriate energy expenditure required for the weight goal.;Weight Management: Provide education and appropriate resources to help participant work on and attain dietary goals.;Weight Management/Obesity: Establish reasonable short term and long term weight goals.;Obesity: Provide education and appropriate resources to help participant work on and attain dietary goals.   Admit Weight 260 lb 12.8 oz (118.3 kg)   Goal Weight: Short Term 230 lb (104.3 kg)   Goal Weight: Long Term 185 lb (83.9 kg)   Expected Outcomes Short Term: Continue to assess and modify interventions until short term weight is achieved;Weight Loss: Understanding of general recommendations for a balanced deficit meal plan, which promotes 1-2 lb weight loss per week and includes a negative energy balance of [phone] kcal/d;Understanding recommendations for meals to include 15-35% energy as protein, 25-35% energy from fat, 35-60% energy from carbohydrates, less than 200mg  of dietary cholesterol, 20-35 gm of total fiber daily;Understanding of distribution of calorie intake throughout the day with the consumption of 4-5 meals/snacks   Improve shortness of breath with ADL's Yes   Intervention Provide education, individualized exercise plan and daily activity instruction to help decrease symptoms of SOB with activities of daily living.   Expected Outcomes Short Term: Achieves a reduction of symptoms when performing activities of daily living.   Develop more efficient breathing techniques such as purse lipped breathing and diaphragmatic breathing; and practicing self-pacing with activity Yes   Intervention Provide education,  demonstration and support about specific breathing techniuqes utilized for more efficient breathing. Include techniques such as pursed lipped breathing, diaphragmatic breathing and self-pacing activity.   Expected Outcomes Short Term: Participant will be able to demonstrate and use breathing techniques as needed throughout daily activities.   Diabetes Yes   Intervention Provide education about signs/symptoms and action to  take for hypo/hyperglycemia.;Provide education about proper nutrition, including hydration, and aerobic/resistive exercise prescription along with prescribed medications to achieve blood glucose in normal ranges: Fasting glucose 65-99 mg/dL   Expected Outcomes Short Term: Participant verbalizes understanding of the signs/symptoms and immediate care of hyper/hypoglycemia, proper foot care and importance of medication, aerobic/resistive exercise and nutrition plan for blood glucose control.;Long Term: Attainment of HbA1C < 7%.   Hypertension Yes   Intervention Monitor prescription use compliance.;Provide education on lifestyle modifcations including regular physical activity/exercise, weight management, moderate sodium restriction and increased consumption of fresh fruit, vegetables, and low fat dairy, alcohol moderation, and smoking cessation.   Expected Outcomes Short Term: Continued assessment and intervention until BP is < 140/33mm HG in hypertensive participants. < 130/40mm HG in hypertensive participants with diabetes, heart failure or chronic kidney disease.;Long Term: Maintenance of blood pressure at goal levels.   Lipids Yes   Intervention Provide education and support for participant on nutrition & aerobic/resistive exercise along with prescribed medications to achieve LDL 70mg , HDL >40mg .   Expected Outcomes Short Term: Participant states understanding of desired cholesterol values and is compliant with medications prescribed. Participant is following exercise prescription and  nutrition guidelines.;Long Term: Cholesterol controlled with medications as prescribed, with individualized exercise RX and with personalized nutrition plan. Value goals: LDL < 70mg , HDL > 40 mg.   Stress Yes   Intervention Offer individual and/or small group education and counseling on adjustment to heart disease, stress management and health-related lifestyle change. Teach and support self-help strategies.;Refer participants experiencing significant psychosocial distress to appropriate mental health specialists for further evaluation and treatment. When possible, include family members and significant others in education/counseling sessions.   Expected Outcomes Short Term: Participant demonstrates changes in health-related behavior, relaxation and other stress management skills, ability to obtain effective social support, and compliance with psychotropic medications if prescribed.;Long Term: Emotional wellbeing is indicated by absence of clinically significant psychosocial distress or social isolation.       Personal Goals Discharge:     Goals and Risk Factor Review - 06/29/16 1124      Core Components/Risk Factors/Patient Goals Review   Personal Goals Review Weight Management/Obesity;Improve shortness of breath with ADL's;Diabetes;Hypertension;Lipids   Review Tycen is doinf well with weight today. Is  back at 260  lbs after having gained four pounds. He is working on his nutrition goals and is doing well with some of the changes he made. Less butter and salt on his vegetables. BP,  Blood [place], Lipids are maintaining control with his meds, exercise and changes in his eating habits. Less sodium with his meals. HIs SOB is improving as he increases his exercise levels. Titus can see a difference with the SOB and his activities that the SOB is decreased. His MD has decreased the ampount of medications from 36 pills a day to 28 pills a day.    Expected Outcomes Short: Continue to work on weight loss.   Long: Improve with use of nutrition plan. Continue with exercise routine for risk factor control and improved SOB symptoms.   Long TErm: Maintenance of exercise and nutrition plan for continued risk factor control      Nutrition & Weight - Outcomes:     Pre Biometrics - 04/12/16 1536      Pre Biometrics   Height 6\' 1"  (1.854 m)   Weight 260 lb 12.8 oz (118.3 kg)   Waist Circumference 47 inches   Hip Circumference 47 inches   Waist to Hip Ratio 1 %  BMI (Calculated) 34.5   Single Leg Stand 9.28 seconds       Nutrition:     Nutrition Therapy & Goals - 05/11/16 1108      Nutrition Therapy   Diet Instructed patient accompanied by his wife on a meal plan based on 2000 calories including heart healthy dietary guidelines as well as diabetes dietary guidelines.   Drug/Food Interactions Statins/Certain Fruits   Protein (specify units) 8 ounces   Fiber 30 grams   Whole Grain Foods 3 servings   Saturated Fats 13 max. grams   Fruits and Vegetables 5 servings/day   Sodium 2000 grams  1500mg  idfeal     Personal Nutrition Goals   Nutrition Goal Increase fruits and vegetables to at least 5 servings daily.   Personal Goal #2 Read labels for saturated fat, trans fat and sodium   Personal Goal #3 Rinse canned vegetables; use fresh or frozen whenever possible.   Personal Goal #4 Balance meals with protein, 3-4 servings of carbohydrate and non-starchy vegetables. Refer to "Diabetes Plate" hand-out and "Planning a Balanced Meal" handout.     Intervention Plan   Intervention Prescribe, educate and counsel regarding individualized specific dietary modifications aiming towards targeted core components such as weight, hypertension, lipid management, diabetes, heart failure and other comorbidities.;Nutrition handout(s) given to patient.   Expected Outcomes Short Term Goal: Understand basic principles of dietary content, such as calories, fat, sodium, cholesterol and nutrients.;Short Term Goal: A  plan has been developed with personal nutrition goals set during dietitian appointment.;Long Term Goal: Adherence to prescribed nutrition plan.      Nutrition Discharge:     Nutrition Assessments - 07/13/16 1033      MEDFICTS Scores   Post Score 75      Education Questionnaire Score:     Knowledge Questionnaire Score - 07/13/16 0831      Knowledge Questionnaire Score   Pre Score 18/28   Post Score 21/28  results reveiwed with pt today      Goals reviewed with patient; copy given to patient.

## 2016-07-13 NOTE — Progress Notes (Signed)
Daily Session Note  Patient Details  Name: Scott VANDERVLIET Sr. MRN: [id number] Date of Birth: 04-01-1945 Referring Provider:     Cardiac Rehab from 04/12/2016 in [facility] Cardiac and Pulmonary Rehab  Referring Provider  Frankey Poot MD      Encounter Date: 07/13/2016  Check In:     Session Check In - 07/13/16 0819      Check-In   Staff Present Heath Lark, RN, BSN, CCRP;Shyanne Mcclary Luan Pulling, MA, ACSM RCEP, Exercise Physiologist;Laureen Janell Quiet, RRT, Respiratory Therapist   Supervising physician immediately available to respond to emergencies See telemetry face sheet for immediately available ER MD   Medication changes reported     No   Fall or balance concerns reported    No   Warm-up and Cool-down Performed on first and last piece of equipment   Resistance Training Performed Yes   VAD Patient? No     Pain Assessment   Currently in Pain? No/denies   Multiple Pain Sites No         History  Smoking Status  . Never Smoker  Smokeless Tobacco  . Never Used    Goals Met:  Independence with exercise equipment Exercise tolerated well No report of cardiac concerns or symptoms Strength training completed today  Goals Unmet:  Not Applicable  Comments: Pt able to follow exercise prescription today without complaint.  Will continue to monitor for progression.    Dr. Emily Filbert is Medical Director for [facility].

## 2016-07-15 ENCOUNTER — Encounter: Payer: No Typology Code available for payment source | Admitting: *Deleted

## 2016-07-15 DIAGNOSIS — Z951 Presence of aortocoronary bypass graft: Secondary | ICD-10-CM | POA: Diagnosis not present

## 2016-07-15 NOTE — Progress Notes (Signed)
Cardiac Individual Treatment Plan  Patient Details  Name: Scott Acevedo. MRN: [id number] Date of Birth: November 28, 1945 Referring Provider:     Cardiac Rehab from 04/12/2016 in [facility] Cardiac and Pulmonary Rehab  Referring Provider  Hinderliter, Antony Haste MD      Initial Encounter Date:    Cardiac Rehab from 04/12/2016 in [facility] Cardiac and Pulmonary Rehab  Date  04/12/16  Referring Provider  Hinderliter, Antony Haste MD      Visit Diagnosis: S/P CABG (coronary artery bypass graft)  Patient's Home Medications on Admission:  Current Outpatient Prescriptions:  .  acetaminophen (TYLENOL) 500 MG tablet, Take 1,000 mg by mouth 3 (three) times daily. , Disp: , Rfl:  .  albuterol (PROVENTIL HFA;VENTOLIN HFA) 108 (90 Base) MCG/ACT inhaler, Inhale 2 puffs into the lungs every 6 (six) hours as needed for wheezing., Disp: , Rfl:  .  amiodarone (PACERONE) 200 MG tablet, Take 200 mg by mouth daily., Disp: , Rfl:  .  apixaban (ELIQUIS) 2.5 MG TABS tablet, Take 2.5 mg by mouth 2 (two) times daily., Disp: , Rfl:  .  aspirin EC 81 MG tablet, Take 81 mg by mouth., Disp: , Rfl:  .  atorvastatin (LIPITOR) 80 MG tablet, Take 80 mg by mouth daily., Disp: , Rfl:  .  Calcium Carbonate-Vitamin D 600-400 MG-UNIT tablet, Take 1 tablet by mouth 2 (two) times daily with a meal., Disp: , Rfl:  .  cephALEXin (KEFLEX) 500 MG capsule, Take 1 capsule (500 mg total) by mouth 4 (four) times daily., Disp: 40 capsule, Rfl: 0 .  cetirizine (ZYRTEC) 10 MG tablet, Take 10 mg by mouth daily., Disp: , Rfl:  .  FLUoxetine (PROZAC) 20 MG tablet, Take 80 mg by mouth daily., Disp: , Rfl:  .  gabapentin (NEURONTIN) 300 MG capsule, Take 600 mg by mouth 3 (three) times daily., Disp: , Rfl:  .  glucosamine-chondroitin 500-400 MG tablet, Take 1 tablet by mouth 2 (two) times daily., Disp: , Rfl:  .  hydrocortisone 2.5 % cream, Apply 1 application topically 2 (two) times daily., Disp: , Rfl:  .  lisinopril (PRINIVIL,ZESTRIL) 2.5 MG tablet, Take 2.5 mg by  mouth daily., Disp: , Rfl:  .  magnesium oxide (MAG-OX) 400 MG tablet, Take 400 mg by mouth 2 (two) times daily., Disp: , Rfl:  .  metFORMIN (GLUCOPHAGE) 500 MG tablet, Take 500 mg by mouth 2 (two) times daily with a meal., Disp: , Rfl:  .  metoprolol succinate (TOPROL-XL) 25 MG 24 hr tablet, Take 12.5 mg by mouth daily. , Disp: , Rfl:  .  mirtazapine (REMERON) 15 MG tablet, Take 45 mg by mouth at bedtime., Disp: , Rfl:  .  pramipexole (MIRAPEX) 1 MG tablet, Take 0.5 mg by mouth every evening., Disp: , Rfl:  .  prochlorperazine (COMPAZINE) 5 MG tablet, Take 10 mg by mouth every 8 (eight) hours as needed for nausea., Disp: , Rfl:  .  ranitidine (ZANTAC) 150 MG tablet, Take 150 mg by mouth 2 (two) times daily., Disp: , Rfl:   Past Medical History: Past Medical History:  Diagnosis Date  . COPD (chronic obstructive pulmonary disease) ([place])   . Diabetes mellitus without complication ([place])   . Hypertension     Tobacco Use: History  Smoking Status  . Never Smoker  Smokeless Tobacco  . Never Used    Labs: Recent Review Flowsheet Data    There is no flowsheet data to display.       Exercise Target Goals:  Exercise Program Goal: Individual exercise prescription set with THRR, safety & activity barriers. Participant demonstrates ability to understand and report RPE using BORG scale, to self-measure pulse accurately, and to acknowledge the importance of the exercise prescription.  Exercise Prescription Goal: Starting with aerobic activity 30 plus minutes a day, 3 days per week for initial exercise prescription. Provide home exercise prescription and guidelines that participant acknowledges understanding prior to discharge.  Activity Barriers & Risk Stratification:     Activity Barriers & Cardiac Risk Stratification - 04/12/16 1308      Activity Barriers & Cardiac Risk Stratification   Activity Barriers Arthritis;Back Problems;Joint Problems;Deconditioning;Muscular  Weakness;Shortness of Breath;Assistive Device;History of Falls;Balance Concerns  fractured hip in [employer]   Cardiac Risk Stratification High      6 Minute Walk:     6 Minute Walk    Row Name 04/12/16 1532 07/08/16 0956       6 Minute Walk   Phase Initial Discharge    Distance 1130 feet 1312 feet    Walk Time 6 minutes 6 minutes    # of Rest Breaks 0 0    MPH 2.14 2.48    METS 2.47 2.63    RPE 15 13    Perceived Dyspnea  4  -    VO2 Peak 8.66 9.21    Symptoms Yes (comment) Yes (comment)    Comments SOB dizziness (BP dropped and BG was 114 after juice)    Resting HR 77 bpm 86 bpm    Resting BP 180/80  recheck 134/74 124/64    Max Ex. HR 93 bpm 118 bpm    Max Ex. BP 186/74 94/60    2 Minute Post BP 146/74  -       Oxygen Initial Assessment:   Oxygen Re-Evaluation:   Oxygen Discharge (Final Oxygen Re-Evaluation):   Initial Exercise Prescription:     Initial Exercise Prescription - 04/12/16 1500      Date of Initial Exercise RX and Referring Provider   Date 04/12/16   Referring Provider Hinderliter, Alan MD     Treadmill   MPH 1.8   Grade 0.5   Minutes 15   METs 2.5     NuStep   Level 2   SPM 80   Minutes 15   METs 2     Biostep-RELP   Level 3   SPM 50   Minutes 15   METs 2     Prescription Details   Frequency (times per week) 2   Duration Progress to 45 minutes of aerobic exercise without signs/symptoms of physical distress     Intensity   THRR 40-80% of Max Heartrate 106-135   Ratings of Perceived Exertion 11-13   Perceived Dyspnea 0-4     Progression   Progression Continue to progress workloads to maintain intensity without signs/symptoms of physical distress.     Resistance Training   Training Prescription Yes   Weight 2 lbs   Reps 10-15      Perform Capillary Blood Glucose checks as needed.  Exercise Prescription Changes:     Exercise Prescription Changes    Row Name 04/22/16 1400 05/07/16 1500 05/18/16 1500 06/02/16 1000  06/17/16 1500     Response to Exercise   Blood Pressure (Admit) 126/80 150/82 138/78 126/76 134/72   Blood Pressure (Exercise) 128/62 152/74 134/72 122/69 134/64   Blood Pressure (Exit) 114/56 128/74 110/68 126/70 122/60   Heart Rate (Admit) 74 bpm 71 bpm 87 bpm 79 bpm 79 bpm  Heart Rate (Exercise) 103 bpm 99 bpm 111 bpm 103 bpm 107 bpm   Heart Rate (Exit) 94 bpm 77 bpm 85 bpm 74 bpm 86 bpm   Rating of Perceived Exertion (Exercise) '13 13 13 12 14   '$ Symptoms shortness of breath shortness of breath on the treadmill shortness of breath on the treadmill shortness of breath on the treadmill shortness of breath on the treadmill   Duration Progress to 45 minutes of aerobic exercise without signs/symptoms of physical distress Progress to 45 minutes of aerobic exercise without signs/symptoms of physical distress Continue with 45 min of aerobic exercise without signs/symptoms of physical distress. Continue with 45 min of aerobic exercise without signs/symptoms of physical distress. Continue with 45 min of aerobic exercise without signs/symptoms of physical distress.   Intensity THRR unchanged THRR unchanged THRR unchanged THRR unchanged THRR unchanged     Progression   Progression Continue to progress workloads to maintain intensity without signs/symptoms of physical distress. Continue to progress workloads to maintain intensity without signs/symptoms of physical distress. Continue to progress workloads to maintain intensity without signs/symptoms of physical distress. Continue to progress workloads to maintain intensity without signs/symptoms of physical distress. Continue to progress workloads to maintain intensity without signs/symptoms of physical distress.   Average METs 2.7 2.3 2.67 2.3 2.97     Resistance Training   Training Prescription Yes Yes Yes Yes Yes   Weight 3 lbs 3 lbs 4 lbs 4 lbs 4 lbs   Reps 10-15 10-15 10-15 10-15 10-15     Interval Training   Interval Training No No No No No      Treadmill   MPH 1.8 1.8 1.8 1.8 1.8   Grade 0.5 0.5 0.5 0.5 0.5   Minutes '15 15 15 15 15   '$ METs 2.5 2.5 2.5 2.5 2.5     NuStep   Level '1 2 3 3 3   '$ SPM 80 100 100  -  -   Minutes '15 15 15 15 15   '$ METs 3.6 2.5 2.5 2.4 3.4     Biostep-RELP   Level '1 1 1 3 3   '$ SPM 50 50  -  -  -   Minutes '15 15 15 15 15   '$ METs '2 2 3 2 3     '$ Home Exercise Plan   Plans to continue exercise at  - Home (comment)  walking Home (comment)  walking Home (comment)  walking Home (comment)  walking   Frequency  - Add 2 additional days to program exercise sessions. Add 2 additional days to program exercise sessions. Add 2 additional days to program exercise sessions. Add 2 additional days to program exercise sessions.   Initial Home Exercises Provided  - 05/06/16 05/06/16 05/06/16 05/06/16   Row Name 06/30/16 1600 07/13/16 1300           Response to Exercise   Blood Pressure (Admit) 132/60 128/74      Blood Pressure (Exercise) 140/74 150/70      Blood Pressure (Exit) 104/68 140/84      Heart Rate (Admit) 88 bpm 81 bpm      Heart Rate (Exercise) 117 bpm 114 bpm      Heart Rate (Exit) 81 bpm 85 bpm      Rating of Perceived Exertion (Exercise) 13 13      Symptoms shortness of breath on the treadmill  dizzy spell on treadmill shortness of breath on the treadmill      Duration Continue with 45  min of aerobic exercise without signs/symptoms of physical distress. Continue with 45 min of aerobic exercise without signs/symptoms of physical distress.      Intensity THRR unchanged THRR unchanged        Progression   Progression Continue to progress workloads to maintain intensity without signs/symptoms of physical distress. Continue to progress workloads to maintain intensity without signs/symptoms of physical distress.      Average METs 3.07 2.93        Resistance Training   Training Prescription Yes Yes      Weight 4 lbs 4 lbs      Reps 10-15 10-15        Interval Training   Interval Training No No         Treadmill   MPH 1.8 1.8      Grade 0.5 0.5      Minutes 15 15      METs 2.5 2.5        NuStep   Level 3 3      Minutes 15 15      METs 3.7 3.3        Biostep-RELP   Level 3 3      Minutes 15 15      METs 3 3        Home Exercise Plan   Plans to continue exercise at Home (comment)  walking Home (comment)  walking      Frequency Add 2 additional days to program exercise sessions. Add 2 additional days to program exercise sessions.      Initial Home Exercises Provided 05/06/16 05/06/16         Exercise Comments:     Exercise Comments    Row Name 04/20/16 [id number] 04/22/16 1433 06/29/16 1206 07/08/16 0958 07/15/16 0945   Exercise Comments First full day of exercise!  Patient was oriented to gym and equipment including functions, settings, policies, and procedures.  Patient's individual exercise prescription and treatment plan were reviewed.  All starting workloads were established based on the results of the 6 minute walk test done at initial orientation visit.  The plan for exercise progression was also introduced and progression will be customized based on patient's performance and goals. His weight was up two pounds today and he was encouraged to contact his doctor.  He was complaining of increased shortness of breath and swelling.  He says that his shortness of breath was not new. Madsen had a dizzy spell on the treadmill.  He was fine on the NuStep, but after 9 minutes on the treadmill he got dizzy and lightheaded. His blood pressure had dropped to below 90 mmHg. His blood sugar was fine (155 mg/dl). He skipped breakfast but did eat a pack of crackers during education.  He was given water and feet elevated blood pressure did recover to just above 100 mmHG.  He then tried to stand and got dizzy and blood pressure had dropped again. He was given more water and eventually did recover to above 100 mmHg and was asymptomatic.  He was given his blood pressures and encouraged to call his  cardiologist at the [place]. Percival felt dizzy after his walk test.  His BP dropped and BG was 114 after juice.  BP came up to 104/60 then 118 SBP after drinking water. I left a message for [facility] Dr at the [facility]. Idrees graduated today from cardiac rehab with 36 sessions completed.  Details of the patient's exercise prescription and what he needs to  do in order to continue the prescription and progress were discussed with patient.  Patient was given a copy of prescription and goals.  Patient verbalized understanding.  Darren plans to continue to exercise by walking.      Exercise Goals and Review:     Exercise Goals    Row Name 04/12/16 1536             Exercise Goals   Increase Physical Activity Yes       Intervention Provide advice, education, support and counseling about physical activity/exercise needs.;Develop an individualized exercise prescription for aerobic and resistive training based on initial evaluation findings, risk stratification, comorbidities and participant's personal goals.       Expected Outcomes Achievement of increased cardiorespiratory fitness and enhanced flexibility, muscular endurance and strength shown through measurements of functional capacity and personal statement of participant.       Increase Strength and Stamina Yes  more energy and strength       Intervention Provide advice, education, support and counseling about physical activity/exercise needs.;Develop an individualized exercise prescription for aerobic and resistive training based on initial evaluation findings, risk stratification, comorbidities and participant's personal goals.       Expected Outcomes Achievement of increased cardiorespiratory fitness and enhanced flexibility, muscular endurance and strength shown through measurements of functional capacity and personal statement of participant.          Exercise Goals Re-Evaluation :     Exercise Goals Re-Evaluation    Row Name 04/22/16 1431 05/06/16 1042  05/07/16 1550 05/18/16 1523 06/02/16 1035     Exercise Goal Re-Evaluation   Exercise Goals Review Increase Physical Activity;Increase Strenth and Stamina Increase Physical Activity;Increase Strenth and Stamina Increase Physical Activity;Increase Strenth and Stamina Increase Physical Activity;Increase Strenth and Stamina Increase Physical Activity;Increase Strenth and Stamina   Comments Gerrald has completed two full days of exercise.  He is off to a good start.  We will continue to monitor his progression. Reviewed home exercise with pt today.  Pt plans to walk at home for exercise.  Reviewed THR, pulse, RPE, sign and symptoms, and when to call 911 or MD.  Also discussed weather considerations and indoor options.  Pt voiced understanding  He is already doing two days a week at home.  He has been able to tell a difference in how he is feeling as his strength has started to improve.  We will continue to work with him. Yahye has been doing well in rehab.  We have discussed using this time for himself and letting us know how exercise really is going for him.  He has SOB on the treadmill and usually doesn't want [place] to know about it. Ardell continues to do well in rehab.  He seems to be enjoying coming to classes.  He is now up to 3.0 METs on the BioStep.  We will be moving him up on that soon!  He has also increased his weights up to 4lbs.  We will continue to monitor his progression. Xan has been doing well in rehab.  He has missed the last week due to doctor's appointments for him and his wife.  He is up to level 2 now on the BioStep.  We will continue to monitor his progress.   Expected Outcomes Short and Long: Markos will attend classes to work on strength and stamina. Short: Continue to exercise two days a week at home consistently.  Long: Add in thrid day at home and continue to work on  increasing his stamina. Short: Add in his home exercise consistently.  Long: Continue to come to class to work on strength and  stamina Short: Increase workload on the BioStep and increase spm on NuStep.  Long: Continue to come to class and add in exercise at home. Short: Increase speed on treadmill.  Long: Continue to exercise regularly.   [place] Name 06/03/16 1025 06/17/16 1523 06/30/16 1612 07/13/16 1331       Exercise Goal Re-Evaluation   Exercise Goals Review Increase Physical Activity;Increase Strenth and Stamina Increase Physical Activity;Increase Strenth and Stamina Increase Physical Activity;Increase Strenth and Stamina Increase Physical Activity;Increase Strenth and Stamina    Comments Wadell can tell the difference that rehab is making for him.  He can tell that he has increased his stamina and his endurance.  He is also less short of breath than when he started.  He is walking some with his wife and doing some squats to build up strength at home.   Gildardo has been doing well in rehab.  He is feeling better.  He is not consistent in his home exercise as he has said that it is hard to walk with his shortness of breath.  He is up to 3.4 METs on the NuStep!  We will continue to monitor his progression. Rainn continues to do well in rehab.  He did have an episode of orthostatic blood pressure drop on the treadmill.  He is up to 3 METs on the BioStep.  We will continue to monitor his progression. Aarion will be graduating on Thursday!  He has done well in rehab.  He improved his 6MWT by 16%!  He will need a lot of encouragement to continue to exercise on his own at home.    Expected Outcomes Short: Increase time walking at home.  Long: Exercise more regularly Short: Try to increase treadmill some.  Long: Exercise more at home. Short: Increase BioStep workload.  Long: Exercise more at home. Short: Graduate.  Long: Continue to exercise independently       Discharge Exercise Prescription (Final Exercise Prescription Changes):     Exercise Prescription Changes - 07/13/16 1300      Response to Exercise   Blood Pressure (Admit)  128/74   Blood Pressure (Exercise) 150/70   Blood Pressure (Exit) 140/84   Heart Rate (Admit) 81 bpm   Heart Rate (Exercise) 114 bpm   Heart Rate (Exit) 85 bpm   Rating of Perceived Exertion (Exercise) 13   Symptoms shortness of breath on the treadmill   Duration Continue with 45 min of aerobic exercise without signs/symptoms of physical distress.   Intensity THRR unchanged     Progression   Progression Continue to progress workloads to maintain intensity without signs/symptoms of physical distress.   Average METs 2.93     Resistance Training   Training Prescription Yes   Weight 4 lbs   Reps 10-15     Interval Training   Interval Training No     Treadmill   MPH 1.8   Grade 0.5   Minutes 15   METs 2.5     NuStep   Level 3   Minutes 15   METs 3.3     Biostep-RELP   Level 3   Minutes 15   METs 3     Home Exercise Plan   Plans to continue exercise at Home (comment)  walking   Frequency Add 2 additional days to program exercise sessions.   Initial Home Exercises Provided 05/06/16  Nutrition:  Target Goals: Understanding of nutrition guidelines, daily intake of sodium '1500mg'$ , cholesterol '200mg'$ , calories 30% from fat and 7% or less from saturated fats, daily to have 5 or more servings of fruits and vegetables.  Biometrics:     Pre Biometrics - 04/12/16 1536      Pre Biometrics   Height '6\' 1"'$  (1.854 m)   Weight 260 lb 12.8 oz (118.3 kg)   Waist Circumference 47 inches   Hip Circumference 47 inches   Waist to Hip Ratio 1 %   BMI (Calculated) 34.5   Single Leg Stand 9.28 seconds       Nutrition Therapy Plan and Nutrition Goals:     Nutrition Therapy & Goals - 05/11/16 1108      Nutrition Therapy   Diet Instructed patient accompanied by his wife on a meal plan based on 2000 calories including heart healthy dietary guidelines as well as diabetes dietary guidelines.   Drug/Food Interactions Statins/Certain Fruits   Protein (specify units) 8 ounces    Fiber 30 grams   Whole Grain Foods 3 servings   Saturated Fats 13 max. grams   Fruits and Vegetables 5 servings/day   Sodium 2000 grams  '1500mg'$  idfeal     Personal Nutrition Goals   Nutrition Goal Increase fruits and vegetables to at least 5 servings daily.   Personal Goal #2 Read labels for saturated fat, trans fat and sodium   Personal Goal #3 Rinse canned vegetables; use fresh or frozen whenever possible.   Personal Goal #4 Balance meals with protein, 3-4 servings of carbohydrate and non-starchy vegetables. Refer to "Diabetes Plate" hand-out and "Planning a Balanced Meal" handout.     Intervention Plan   Intervention Prescribe, educate and counsel regarding individualized specific dietary modifications aiming towards targeted core components such as weight, hypertension, lipid management, diabetes, heart failure and other comorbidities.;Nutrition handout(s) given to patient.   Expected Outcomes Short Term Goal: Understand basic principles of dietary content, such as calories, fat, sodium, cholesterol and nutrients.;Short Term Goal: A plan has been developed with personal nutrition goals set during dietitian appointment.;Long Term Goal: Adherence to prescribed nutrition plan.      Nutrition Discharge: Rate Your Plate Scores:     Nutrition Assessments - 07/13/16 1033      MEDFICTS Scores   Post Score 75      Nutrition Goals Re-Evaluation:     Nutrition Goals Re-Evaluation    Row Name 05/06/16 1054 06/03/16 1039 06/29/16 1119         Goals   Nutrition Goal  - Increase fruits and vegetables to at least 5 servings daily. Read labels, Rinse canned vegetables.  More protein. Increase fruits and vegetables to at least 5 servings daily. Read labels, Rinse canned vegetables.       Comment Tab has started to work on his diet some.  He is trying to stay away from white grains.  He has an appointment to meet with the dietician on Tuesday April 24. Everitt has increased his intake of  fruits and vegetables, but his weakness is a tomato sandwich.  They are reading labels.  His wife has started to rinse some vegetables, but not all of them yet.  They are working on more protein, but still need more.  They are still eating white grains and was encouraged to switch to whole grains. Latravis had forgotten about rinsing the canned vegatbles.  Discussed with him how it can lower the sodium content of the food item.  He will start rinsing canned foods.  Travis states that his wife is working on keeping his meals balanced for his diabetes.      Expected Outcome Set nutrition goals. Short: Eat more protein and watch sugars and simple carbs.  Long: Eat a heart healthy diet. Short term goal:Continue to work on healthy eating habits, begin to rinse the canned vegeatable if he does not eat fresh or frozen vegetables. Continue to work on balanced meal plans to help with diabetes control. Long Term GOal: Maintianing healthy eating habits       Personal Goal #2 Re-Evaluation   Personal Goal #2  -  - Read labels for saturated fat, trans fat and sodium       Personal Goal #3 Re-Evaluation   Personal Goal #3  -  - Rinse canned vegetables; use fresh or frozen whenever possible.       Personal Goal #4 Re-Evaluation   Personal Goal #4  -  - Balance meals with protein, 3-4 servings of carbohydrate and non-starchy vegetables. Refer to "Diabetes Plate" hand-out and "Planning a Balanced Meal" handout.        Nutrition Goals Discharge (Final Nutrition Goals Re-Evaluation):     Nutrition Goals Re-Evaluation - 06/29/16 1119      Goals   Nutrition Goal Increase fruits and vegetables to at least 5 servings daily. Read labels, Rinse canned vegetables.     Comment Sally had forgotten about rinsing the canned vegatbles.  Discussed with him how it can lower the sodium content of the food item.  He will start rinsing canned foods.  Ignace states that his wife is working on keeping his meals balanced for his diabetes.     Expected Outcome Short term goal:Continue to work on healthy eating habits, begin to rinse the canned vegeatable if he does not eat fresh or frozen vegetables. Continue to work on balanced meal plans to help with diabetes control. Long Term GOal: Maintianing healthy eating habits     Personal Goal #2 Re-Evaluation   Personal Goal #2 Read labels for saturated fat, trans fat and sodium     Personal Goal #3 Re-Evaluation   Personal Goal #3 Rinse canned vegetables; use fresh or frozen whenever possible.     Personal Goal #4 Re-Evaluation   Personal Goal #4 Balance meals with protein, 3-4 servings of carbohydrate and non-starchy vegetables. Refer to "Diabetes Plate" hand-out and "Planning a Balanced Meal" handout.      Psychosocial: Target Goals: Acknowledge presence or absence of significant depression and/or stress, maximize coping skills, provide positive support system. Participant is able to verbalize types and ability to use techniques and skills needed for reducing stress and depression.   Initial Review & Psychosocial Screening:     Initial Psych Review & Screening - 04/12/16 1311      Initial Review   Current issues with Current Stress Concerns   Source of Stress Concerns Chronic Illness;Occupation     Family Dynamics   [place]? Yes     Barriers   Psychosocial barriers to participate in program The patient should benefit from training in stress management and relaxation.     Screening Interventions   Interventions Yes;Encouraged to exercise;Program counselor consult   Expected Outcomes Short Term goal: Utilizing psychosocial counselor, staff and physician to assist with identification of specific Stressors or current issues interfering with healing process. Setting desired goal for each stressor or current issue identified.;Long Term Goal: Stressors or current issues are controlled or eliminated.;Short Term  goal: Identification and review with participant of any  Quality of Life or Depression concerns found by scoring the questionnaire.      Quality of Life Scores:      Quality of Life - 07/13/16 0832      Quality of Life Scores   Health/Function Pre 15.57 %   Health/Function Post 26.8 %   Health/Function % Change 72.13 %   Socioeconomic Pre 21.44 %   Socioeconomic Post 20.57 %   Socioeconomic % Change  -4.06 %   Psych/Spiritual Pre 22.07 %   Psych/Spiritual Post 26.57 %   Psych/Spiritual % Change 20.39 %   Family Pre 19.2 %   Family Post 22.8 %   Family % Change 18.75 %   GLOBAL Pre 18.73 %   GLOBAL Post 26.88 %   GLOBAL % Change 43.51 %      PHQ-9: Recent Review Flowsheet Data    Depression screen [facility] 2/9 07/13/2016 04/12/2016   Decreased Interest 0 0   Down, Depressed, Hopeless 0 2   PHQ - 2 Score 0 2   Altered sleeping 1 2   Tired, decreased energy 1 3   Change in appetite 0 0   Feeling bad or failure about yourself  0 1   Trouble concentrating 0 2   Moving slowly or fidgety/restless 0 0   Suicidal thoughts 0 1   PHQ-9 Score 2 11   Difficult doing work/chores Not difficult at all Somewhat difficult     Interpretation of Total Score  Total Score Depression Severity:  1-4 = Minimal depression, 5-9 = Mild depression, 10-14 = Moderate depression, 15-19 = Moderately severe depression, 20-27 = Severe depression   Psychosocial Evaluation and Intervention:     Psychosocial Evaluation - 06/10/16 0923      Psychosocial Evaluation & Interventions   Comments Counselor met with Fayrene Fearing for initial psychosocial evaluation.  He is a 71 year old who had a heart attack the end of last year.  He also struggles with multiple health issues such as diabetes; COPD and sleep apnea.  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  Lewin reports some difficulty sleeping at night even with the use of his CPAP.  He is on medications for this that are  helpful sometimes.  He reports a history of depression and is on medication for that which he states is helpful.  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  He has goals for this program to get stronger and increase his endurance over all.  He will be followed with staff throughout the course of this program.   Expected Outcomes Vale will benefit from consistent exercise to achieve his stated goals.  He will also benefit from the psychoeducational components of this program - particularly stress management and depression.  Counselor and staff will be following with him.     Continue Psychosocial Services  Follow up required by staff      Psychosocial Re-Evaluation:     Psychosocial Re-Evaluation    Row Name 06/03/16 0922 06/29/16 1131           Psychosocial Re-Evaluation  Current issues with Current Sleep Concerns;Current Stress Concerns Current Sleep Concerns;Current Stress Concerns      Comments Aleister is still not sleeping very well despite being on 3 does of a medication.  He does want to talk to his physcian more about it.  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  He has also recently added that his wife has a broken arm currently and then his car died on his way into rehab today.  He continues to have a lot on his plate.  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  He is working on improving his self care.  Overall he is generally positive and has felt the program has been a blessing to him and eye opening. Keone is waiting for a new CPAP mask to help with his sleep. He has a mask that does not fir well enough to stay on during the night.  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 He has joined a Surveyor, mining and looks forward to [employer].       Expected Outcomes Short: Continue to come to class, talk to doctor about sleep.  Long: Maintain postive outlook and his prayer time. Short: Continue to come to class, waiting for new CPAP mask.  Long: Maintain postive outlook, his stress relief activities of stamp collecting and his prayer time.      Interventions Encouraged to attend Cardiac Rehabilitation for the exercise;Stress management education  -      Continue Psychosocial Services  Follow up required by staff  -        Initial Review   Source of Stress Concerns Chronic Illness;Occupation  -         Psychosocial Discharge (Final Psychosocial Re-Evaluation):     Psychosocial Re-Evaluation - 06/29/16 1131      Psychosocial Re-Evaluation   Current issues with Current Sleep Concerns;Current Stress Concerns   Comments Kamen is waiting for a new CPAP mask to help with his sleep. He has a mask that does not fir well enough to stay on during the night.  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 He has joined a Surveyor, mining and looks forward to [employer].    Expected Outcomes Short: Continue to come to class, waiting for new CPAP mask.  Long: Maintain postive outlook, his stress relief activities of stamp collecting and his prayer time.      Vocational Rehabilitation: Provide vocational rehab assistance to qualifying candidates.   Vocational Rehab Evaluation & Intervention:     Vocational Rehab - 04/12/16 1255      Initial Vocational Rehab Evaluation & Intervention   Assessment shows need for  Vocational Rehabilitation No      Education: Education Goals: Education classes will be provided on a weekly basis, covering required topics. Participant will state understanding/return demonstration of topics presented.  Learning Barriers/Preferences:     Learning Barriers/Preferences - 04/12/16 1255      Learning Barriers/Preferences   Learning Barriers  Sight;Hearing   Learning Preferences None      Education Topics: General Nutrition Guidelines/Fats and Fiber: -Group instruction provided by verbal, written material, models and posters to present the general guidelines for heart healthy nutrition. Gives an explanation and review of dietary fats and fiber.   Cardiac Rehab from 07/13/2016 in [facility] Cardiac and Pulmonary Rehab  Date  07/13/16  Educator  CR  Instruction Review Code  2- meets goals/outcomes      Controlling Sodium/Reading Food Labels: -Group verbal and written material supporting the discussion of sodium use in heart healthy nutrition. Review and explanation with models, verbal and written materials for utilization of the food label.   Cardiac Rehab from 07/13/2016 in [facility] Cardiac and Pulmonary Rehab  Date  05/25/16  Educator  PI  Instruction Review Code  2- meets goals/outcomes      Exercise Physiology & Risk Factors: - Group verbal and written instruction with models to review the exercise physiology of the cardiovascular system and associated critical values. Details cardiovascular disease risk factors and the goals associated with each risk factor.   Aerobic Exercise & Resistance Training: - Gives group verbal and written discussion on the health impact of inactivity. On the components of aerobic and resistive training programs and the benefits of this training and how to safely progress through these programs.   Cardiac Rehab from 07/13/2016 in [facility] Cardiac and Pulmonary Rehab  Date  06/03/16  Educator  [facility]  Instruction Review Code  2- meets  goals/outcomes      Flexibility, Balance, General Exercise Guidelines: - Provides group verbal and written instruction on the benefits of flexibility and balance training programs. Provides general exercise guidelines with specific guidelines to those with heart or lung disease. Demonstration and skill practice provided.   Cardiac Rehab from 07/13/2016 in [facility] Cardiac and Pulmonary Rehab  Date  06/08/16  Educator  [facility]  Instruction Review Code  2- meets goals/outcomes      Stress Management: - Provides group verbal and written instruction about the health risks of elevated stress, cause of high stress, and healthy ways to reduce stress.   Cardiac Rehab from 07/13/2016 in [facility] Cardiac and Pulmonary Rehab  Date  06/15/16  Educator  [facility]  Instruction Review Code  2- meets goals/outcomes      Depression: - Provides group verbal and written instruction on the correlation between heart/lung disease and depressed mood, treatment options, and the stigmas associated with seeking treatment.   Anatomy & Physiology of the Heart: - Group verbal and written instruction and models provide basic cardiac anatomy and physiology, with the coronary electrical and arterial systems. Review of: AMI, Angina, Valve disease, Heart Failure, Cardiac Arrhythmia, Pacemakers, and the ICD.   Cardiac Rehab from 07/13/2016 in [facility] Cardiac and Pulmonary Rehab  Date  05/20/16  Educator  [place]  Instruction Review Code  2- meets goals/outcomes      Cardiac Procedures: - Group verbal and written instruction and models to describe the testing methods done to diagnose heart disease. Reviews the outcomes of the test results. Describes the treatment choices: Medical Management, Angioplasty, or Coronary Bypass Surgery.   Cardiac Rehab from 07/13/2016 in [facility] Cardiac and Pulmonary Rehab  Date  06/22/16  Educator  SB  Instruction Review Code  2- meets goals/outcomes      Cardiac Medications: - Group verbal and written  instruction to review commonly prescribed medications for heart disease. Reviews the medication, class of the drug, and side effects. Includes the steps  to properly store meds and maintain the prescription regimen.   Cardiac Rehab from 07/13/2016 in [facility] Cardiac and Pulmonary Rehab  Date  06/29/16 [6/12 PART 1]  Educator  SB  Instruction Review Code  2- meets goals/outcomes      Go Sex-Intimacy & Heart Disease, Get SMART - Goal Setting: - Group verbal and written instruction through game format to discuss heart disease and the return to sexual intimacy. Provides group verbal and written material to discuss and apply goal setting through the application of the S.M.A.R.T. Method.   Cardiac Rehab from 07/13/2016 in [facility] Cardiac and Pulmonary Rehab  Date  06/22/16  Educator  SB  Instruction Review Code  2- meets goals/outcomes      Other Matters of the Heart: - Provides group verbal, written materials and models to describe Heart Failure, Angina, Valve Disease, and Diabetes in the realm of heart disease. Includes description of the disease process and treatment options available to the cardiac patient.   Cardiac Rehab from 07/13/2016 in [facility] Cardiac and Pulmonary Rehab  Date  04/22/16  Educator  SB  Instruction Review Code  2- meets goals/outcomes      Exercise & Equipment Safety: - Individual verbal instruction and demonstration of equipment use and safety with use of the equipment.   Cardiac Rehab from 07/13/2016 in [facility] Cardiac and Pulmonary Rehab  Date  04/12/16  Educator  C. EnterkinRN  Instruction Review Code  1- partially meets, needs review/practice      Infection Prevention: - Provides verbal and written material to individual with discussion of infection control including proper hand washing and proper equipment cleaning during exercise session.   Cardiac Rehab from 07/13/2016 in [facility] Cardiac and Pulmonary Rehab  Date  04/12/16  Educator  C. Enterkin,RN  Instruction Review  Code  2- meets goals/outcomes      Falls Prevention: - Provides verbal and written material to individual with discussion of falls prevention and safety.   Cardiac Rehab from 07/13/2016 in [facility] Cardiac and Pulmonary Rehab  Date  04/12/16  Educator  C. [place]  Instruction Review Code  1- partially meets, needs review/practice      Diabetes: - Individual verbal and written instruction to review signs/symptoms of diabetes, desired ranges of glucose level fasting, after meals and with exercise. Advice that pre and post exercise glucose checks will be done for 3 sessions at entry of program.   Cardiac Rehab from 07/13/2016 in [facility] Cardiac and Pulmonary Rehab  Date  04/12/16  Educator  C. [place]  Instruction Review Code  1- partially meets, needs review/practice       Knowledge Questionnaire Score:     Knowledge Questionnaire Score - 07/13/16 0831      Knowledge Questionnaire Score   Pre Score 18/28   Post Score 21/28  results reveiwed with pt today      Core Components/Risk Factors/Patient Goals at Admission:     Personal Goals and Risk Factors at Admission - 04/12/16 1308      Core Components/Risk Factors/Patient Goals on Admission    Weight Management Yes;Obesity;Weight Maintenance   Intervention Weight Management: Develop a combined nutrition and exercise program designed to reach desired caloric intake, while maintaining appropriate intake of nutrient and fiber, sodium and fats, and appropriate energy expenditure required for the weight goal.;Weight Management: Provide education and appropriate resources to help participant work on and attain dietary goals.;Weight Management/Obesity: Establish reasonable short term and long term weight goals.;Obesity: Provide education and appropriate resources to help participant  work on and attain dietary goals.   Admit Weight 260 lb 12.8 oz (118.3 kg)   Goal Weight: Short Term 230 lb (104.3 kg)   Goal Weight: Long Term 185 lb  (83.9 kg)   Expected Outcomes Short Term: Continue to assess and modify interventions until short term weight is achieved;Weight Loss: Understanding of general recommendations for a balanced deficit meal plan, which promotes 1-2 lb weight loss per week and includes a negative energy balance of [phone] kcal/d;Understanding recommendations for meals to include 15-35% energy as protein, 25-35% energy from fat, 35-60% energy from carbohydrates, less than '200mg'$  of dietary cholesterol, 20-35 gm of total fiber daily;Understanding of distribution of calorie intake throughout the day with the consumption of 4-5 meals/snacks   Improve shortness of breath with ADL's Yes   Intervention Provide education, individualized exercise plan and daily activity instruction to help decrease symptoms of SOB with activities of daily living.   Expected Outcomes Short Term: Achieves a reduction of symptoms when performing activities of daily living.   Develop more efficient breathing techniques such as purse lipped breathing and diaphragmatic breathing; and practicing self-pacing with activity Yes   Intervention Provide education, demonstration and support about specific breathing techniuqes utilized for more efficient breathing. Include techniques such as pursed lipped breathing, diaphragmatic breathing and self-pacing activity.   Expected Outcomes Short Term: Participant will be able to demonstrate and use breathing techniques as needed throughout daily activities.   Diabetes Yes   Intervention Provide education about signs/symptoms and action to take for hypo/hyperglycemia.;Provide education about proper nutrition, including hydration, and aerobic/resistive exercise prescription along with prescribed medications to achieve blood glucose in normal ranges: Fasting glucose 65-99 mg/dL   Expected Outcomes Short Term: Participant verbalizes understanding of the signs/symptoms and immediate care of hyper/hypoglycemia, proper foot care  and importance of medication, aerobic/resistive exercise and nutrition plan for blood glucose control.;Long Term: Attainment of HbA1C < 7%.   Hypertension Yes   Intervention Monitor prescription use compliance.;Provide education on lifestyle modifcations including regular physical activity/exercise, weight management, moderate sodium restriction and increased consumption of fresh fruit, vegetables, and low fat dairy, alcohol moderation, and smoking cessation.   Expected Outcomes Short Term: Continued assessment and intervention until BP is < 140/76m HG in hypertensive participants. < 130/860mHG in hypertensive participants with diabetes, heart failure or chronic kidney disease.;Long Term: Maintenance of blood pressure at goal levels.   Lipids Yes   Intervention Provide education and support for participant on nutrition & aerobic/resistive exercise along with prescribed medications to achieve LDL '70mg'$ , HDL >'40mg'$ .   Expected Outcomes Short Term: Participant states understanding of desired cholesterol values and is compliant with medications prescribed. Participant is following exercise prescription and nutrition guidelines.;Long Term: Cholesterol controlled with medications as prescribed, with individualized exercise RX and with personalized nutrition plan. Value goals: LDL < '70mg'$ , HDL > 40 mg.   Stress Yes   Intervention Offer individual and/or small group education and counseling on adjustment to heart disease, stress management and health-related lifestyle change. Teach and support self-help strategies.;Refer participants experiencing significant psychosocial distress to appropriate mental health specialists for further evaluation and treatment. When possible, include family members and significant others in education/counseling sessions.   Expected Outcomes Short Term: Participant demonstrates changes in health-related behavior, relaxation and other stress management skills, ability to obtain effective  social support, and compliance with psychotropic medications if prescribed.;Long Term: Emotional wellbeing is indicated by absence of clinically significant psychosocial distress or social isolation.      Core Components/Risk  Factors/Patient Goals Review:      Goals and Risk Factor Review    Row Name 05/06/16 1043 05/11/16 1036 06/03/16 1034 06/29/16 1124       Core Components/Risk Factors/Patient Goals Review   Personal Goals Review Weight Management/Obesity;Develop more efficient breathing techniques such as purse lipped breathing and diaphragmatic breathing and practicing self-pacing with activity.;Improve shortness of breath with ADL's;Diabetes;Hypertension;Lipids Weight Management/Obesity Weight Management/Obesity;Hypertension;Diabetes;Lipids Weight Management/Obesity;Improve shortness of breath with ADL's;Diabetes;Hypertension;Lipids    Review Shiv has been doing well in rehab.  He is still struggling with weight gain and shortness of breath.  He went to the [place] last week and they told him to keep exercising and drink plenty of water.  Since his weight was up again today and his breathing continues to not improve, I encouraged him to call them again to let them know he has gained 4 lbs in a week.  We did review how to do pursed lip and diaphragmatic breathing to help with breath control during activity.  His blood sugars and statins have been doing well and stay stable. However, his blood pressures continue to be all over the map, no consistentsy.  He says that he is checking it at home and keeping a log which I encouraged him to show to his doctor at his next visit.   He talked to his doctor last week about his weight gain.  They did not feel that it was heart failure, but rather that he needed to control his eating and portion sizes better. Johari went to ER last week and was found to have fluid in his lungs.  He started Lasix and is feeling better.  His blood pressures are good and stable.   Blood sugars have beena little high, but trending down.  He is still eating more sweets than he should.  No problems with his statins. Aria is doinf well with weight today. Is  back at 260  lbs after having gained four pounds. He is working on his nutrition goals and is doing well with some of the changes he made. Less butter and salt on his vegetables. BP,  Blood [place], Lipids are maintaining control with his meds, exercise and changes in his eating habits. Less sodium with his meals. HIs SOB is improving as he increases his exercise levels. Stephanos can see a difference with the SOB and his activities that the SOB is decreased. His MD has decreased the ampount of medications from 36 pills a day to 28 pills a day.     Expected Outcomes Short: Call doctor about shortness of breath and weight gain.  Long: Continue to work on weight loss, risk factors, and pursed lip breathing.   Short: Has appt with dietician today.  Long: Work on improved diet and control. Short: Continue to work on weight loss.  Long: Improve diet and control. Short: Continue to work on weight loss.  Long: Improve with use of nutrition plan. Continue with exercise routine for risk factor control and improved SOB symptoms.   Long TErm: Maintenance of exercise and nutrition plan for continued risk factor control       Core Components/Risk Factors/Patient Goals at Discharge (Final Review):      Goals and Risk Factor Review - 06/29/16 1124      Core Components/Risk Factors/Patient Goals Review   Personal Goals Review Weight Management/Obesity;Improve shortness of breath with ADL's;Diabetes;Hypertension;Lipids   Review Ausencio is doinf well with weight today. Is  back at 260  lbs after having  gained four pounds. He is working on his nutrition goals and is doing well with some of the changes he made. Less butter and salt on his vegetables. BP,  Blood [place], Lipids are maintaining control with his meds, exercise and changes in his eating habits.  Less sodium with his meals. HIs SOB is improving as he increases his exercise levels. Ladarious can see a difference with the SOB and his activities that the SOB is decreased. His MD has decreased the ampount of medications from 36 pills a day to 28 pills a day.    Expected Outcomes Short: Continue to work on weight loss.  Long: Improve with use of nutrition plan. Continue with exercise routine for risk factor control and improved SOB symptoms.   Long TErm: Maintenance of exercise and nutrition plan for continued risk factor control      ITP Comments:     ITP Comments    Row Name 04/12/16 1305 04/14/16 0611 05/12/16 0552 05/18/16 0844 06/09/16 0855   ITP Comments ITP Created during Medical Review, Documenation of DX Veteran's Admin Notes which will be scanned.  30 day review. Continue with ITP unless directed changes per Medical Director review  New to program 30 day review. Continue with ITP unless directed changes per Medical Director review Mr Rachell Cipro called out today due to another appointment. 30 day review. Continue with ITP unless directed changes per Medical Director review   Petrey Name 06/29/16 1143 06/30/16 1406 07/07/16 0533 07/08/16 1014     ITP Comments Arthur has episode of dizziness, swaety and weak after 9 minutes on the treadmil today.    BP under 90 systolic. Off the machine sat down  on chair, feet elevated onto another chair and given water to drink. BP up above 301 systolic.  More water, stood and BP dropped under 601 systolic. Sat down,given more water and finally able to stand with BP 112/62.  THis has been reported via call to the office of Dr Alanda Amass at [facility] in the [facility] Ms. Theador Hawthorne, PA called in reference to Mr. Saltos's BP drop yesterday.  She is his cardiologist.  She stated that he has a history of being orthostatic and thought it may have just been exaggerated yesterday since he had not eaten prior to rehab.  She asked that if anything comes  up in the future to let her know.  Two numbers for contact [phone] or [phone]. 30 day review. Continue with ITP unless directed changes per Medical Director review.  Reported one medication stopped and one dose cut in half after talking to his [facility] MD about his episodes of dizziness. Aasir felt dizzy after his walk test.  His BP dropped and BG was 114 after juice.  BP came up to 104/60 then 118 SBP after drinking water. I left a message for his Dr at the [facility].       Comments: Discharge ITP

## 2016-07-15 NOTE — Progress Notes (Signed)
Daily Session Note  Patient Details  Name: Scott RUMPLE Sr. MRN: [id number] Date of Birth: 01-22-1945 Referring Provider:     Cardiac Rehab from 04/12/2016 in [facility]  Referring Provider  Frankey Poot MD      Encounter Date: 07/15/2016  Check In:     Session Check In - 07/15/16 0924      Check-In   Location [facility]   Staff Present Nyoka Cowden, RN, BSN, MA;Edla Para Sherryll Burger, RN BSN;Jessica Luan Pulling, MA, ACSM RCEP, Exercise Physiologist   Supervising physician immediately available to respond to emergencies See telemetry face sheet for immediately available ER MD   Medication changes reported     No   Fall or balance concerns reported    No   Warm-up and Cool-down Performed on first and last piece of equipment   Resistance Training Performed Yes   VAD Patient? No     Pain Assessment   Currently in Pain? No/denies         History  Smoking Status  . Never Smoker  Smokeless Tobacco  . Never Used    Goals Met:  Proper associated with RPD/PD & O2 Sat Independence with exercise equipment Exercise tolerated well Personal goals reviewed No report of cardiac concerns or symptoms Strength training completed today  Goals Unmet:  Not Applicable  Comments:  Scott Acevedo graduated today from cardiac rehab with 36 sessions completed.  Details of the patient's exercise prescription and what he needs to do in order to continue the prescription and progress were discussed with patient.  Patient was given a copy of prescription and goals.  Patient verbalized understanding.  Scott Acevedo plans to continue to exercise by walking.    Dr. Emily Filbert is Medical Director for [facility].

## 2016-07-15 NOTE — Progress Notes (Signed)
Discharge Summary  Patient Details  Name: Scott GUMZ Sr. MRN: [id number] Date of Birth: Aug 20, 1945 Referring Provider:     Cardiac Rehab from 04/12/2016 in [facility]  Referring Provider  Hinderliter, Hessie Diener MD       Number of Visits: 36  Reason for Discharge:  Patient reached a stable level of exercise. Patient independent in their exercise.  Smoking History:  History  Smoking Status  . Never Smoker  Smokeless Tobacco  . Never Used    Diagnosis:  S/P CABG (coronary artery bypass graft)  ADL UCSD:   Initial Exercise Prescription:     Initial Exercise Prescription - 04/12/16 1500      Date of Initial Exercise RX and Referring Provider   Date 04/12/16   Referring Provider Hinderliter, Alan MD     Treadmill   MPH 1.8   Grade 0.5   Minutes 15   METs 2.5     NuStep   Level 2   SPM 80   Minutes 15   METs 2     Biostep-RELP   Level 3   SPM 50   Minutes 15   METs 2     Prescription Details   Frequency (times per week) 2   Duration Progress to 45 minutes of aerobic exercise without signs/symptoms of physical distress     Intensity   THRR 40-80% of Max Heartrate 106-135   Ratings of Perceived Exertion 11-13   Perceived Dyspnea 0-4     Progression   Progression Continue to progress workloads to maintain intensity without signs/symptoms of physical distress.     Resistance Training   Training Prescription Yes   Weight 2 lbs   Reps 10-15      Discharge Exercise Prescription (Final Exercise Prescription Changes):     Exercise Prescription Changes - 07/13/16 1300      Response to Exercise   Blood Pressure (Admit) 128/74   Blood Pressure (Exercise) 150/70   Blood Pressure (Exit) 140/84   Heart Rate (Admit) 81 bpm   Heart Rate (Exercise) 114 bpm   Heart Rate (Exit) 85 bpm   Rating of Perceived Exertion (Exercise) 13   Symptoms shortness of breath on the treadmill   Duration Continue with 45 min of aerobic exercise without  signs/symptoms of physical distress.   Intensity THRR unchanged     Progression   Progression Continue to progress workloads to maintain intensity without signs/symptoms of physical distress.   Average METs 2.93     Resistance Training   Training Prescription Yes   Weight 4 lbs   Reps 10-15     Interval Training   Interval Training No     Treadmill   MPH 1.8   Grade 0.5   Minutes 15   METs 2.5     NuStep   Level 3   Minutes 15   METs 3.3     Biostep-RELP   Level 3   Minutes 15   METs 3     Home Exercise Plan   Plans to continue exercise at Home (comment)  walking   Frequency Add 2 additional days to program exercise sessions.   Initial Home Exercises Provided 05/06/16      Functional Capacity:     6 Minute Walk    Row Name 04/12/16 1532 07/08/16 0956       6 Minute Walk   Phase Initial Discharge    Distance 1130 feet 1312 feet    Walk Time 6  minutes 6 minutes    # of Rest Breaks 0 0    MPH 2.14 2.48    METS 2.47 2.63    RPE 15 13    Perceived Dyspnea  4  -    VO2 Peak 8.66 9.21    Symptoms Yes (comment) Yes (comment)    Comments SOB dizziness (BP dropped and BG was 114 after juice)    Resting HR 77 bpm 86 bpm    Resting BP 180/80  recheck 134/74 124/64    Max Ex. HR 93 bpm 118 bpm    Max Ex. BP 186/74 94/60    2 Minute Post BP 146/74  -       Psychological, QOL, Others - Outcomes: PHQ 2/9: Depression screen [facility] 2/9 07/13/2016 04/12/2016  Decreased Interest 0 0  Down, Depressed, Hopeless 0 2  PHQ - 2 Score 0 2  Altered sleeping 1 2  Tired, decreased energy 1 3  Change in appetite 0 0  Feeling bad or failure about yourself  0 1  Trouble concentrating 0 2  Moving slowly or fidgety/restless 0 0  Suicidal thoughts 0 1  PHQ-9 Score 2 11  Difficult doing work/chores Not difficult at all Somewhat difficult    Quality of Life:     Quality of Life - 07/13/16 0832      Quality of Life Scores   Health/Function Pre 15.57 %   Health/Function  Post 26.8 %   Health/Function % Change 72.13 %   Socioeconomic Pre 21.44 %   Socioeconomic Post 20.57 %   Socioeconomic % Change  -4.06 %   Psych/Spiritual Pre 22.07 %   Psych/Spiritual Post 26.57 %   Psych/Spiritual % Change 20.39 %   Family Pre 19.2 %   Family Post 22.8 %   Family % Change 18.75 %   GLOBAL Pre 18.73 %   GLOBAL Post 26.88 %   GLOBAL % Change 43.51 %      Personal Goals: Goals established at orientation with interventions provided to work toward goal.     Personal Goals and Risk Factors at Admission - 04/12/16 1308      Core Components/Risk Factors/Patient Goals on Admission    Weight Management Yes;Obesity;Weight Maintenance   Intervention Weight Management: Develop a combined nutrition and exercise program designed to reach desired caloric intake, while maintaining appropriate intake of nutrient and fiber, sodium and fats, and appropriate energy expenditure required for the weight goal.;Weight Management: Provide education and appropriate resources to help participant work on and attain dietary goals.;Weight Management/Obesity: Establish reasonable short term and long term weight goals.;Obesity: Provide education and appropriate resources to help participant work on and attain dietary goals.   Admit Weight 260 lb 12.8 oz (118.3 kg)   Goal Weight: Short Term 230 lb (104.3 kg)   Goal Weight: Long Term 185 lb (83.9 kg)   Expected Outcomes Short Term: Continue to assess and modify interventions until short term weight is achieved;Weight Loss: Understanding of general recommendations for a balanced deficit meal plan, which promotes 1-2 lb weight loss per week and includes a negative energy balance of [phone] kcal/d;Understanding recommendations for meals to include 15-35% energy as protein, 25-35% energy from fat, 35-60% energy from carbohydrates, less than 200mg  of dietary cholesterol, 20-35 gm of total fiber daily;Understanding of distribution of calorie intake  throughout the day with the consumption of 4-5 meals/snacks   Improve shortness of breath with ADL's Yes   Intervention Provide education, individualized exercise plan and daily activity  instruction to help decrease symptoms of SOB with activities of daily living.   Expected Outcomes Short Term: Achieves a reduction of symptoms when performing activities of daily living.   Develop more efficient breathing techniques such as purse lipped breathing and diaphragmatic breathing; and practicing self-pacing with activity Yes   Intervention Provide education, demonstration and support about specific breathing techniuqes utilized for more efficient breathing. Include techniques such as pursed lipped breathing, diaphragmatic breathing and self-pacing activity.   Expected Outcomes Short Term: Participant will be able to demonstrate and use breathing techniques as needed throughout daily activities.   Diabetes Yes   Intervention Provide education about signs/symptoms and action to take for hypo/hyperglycemia.;Provide education about proper nutrition, including hydration, and aerobic/resistive exercise prescription along with prescribed medications to achieve blood glucose in normal ranges: Fasting glucose 65-99 mg/dL   Expected Outcomes Short Term: Participant verbalizes understanding of the signs/symptoms and immediate care of hyper/hypoglycemia, proper foot care and importance of medication, aerobic/resistive exercise and nutrition plan for blood glucose control.;Long Term: Attainment of HbA1C < 7%.   Hypertension Yes   Intervention Monitor prescription use compliance.;Provide education on lifestyle modifcations including regular physical activity/exercise, weight management, moderate sodium restriction and increased consumption of fresh fruit, vegetables, and low fat dairy, alcohol moderation, and smoking cessation.   Expected Outcomes Short Term: Continued assessment and intervention until BP is < 140/33mm HG  in hypertensive participants. < 130/27mm HG in hypertensive participants with diabetes, heart failure or chronic kidney disease.;Long Term: Maintenance of blood pressure at goal levels.   Lipids Yes   Intervention Provide education and support for participant on nutrition & aerobic/resistive exercise along with prescribed medications to achieve LDL 70mg , HDL >40mg .   Expected Outcomes Short Term: Participant states understanding of desired cholesterol values and is compliant with medications prescribed. Participant is following exercise prescription and nutrition guidelines.;Long Term: Cholesterol controlled with medications as prescribed, with individualized exercise RX and with personalized nutrition plan. Value goals: LDL < 70mg , HDL > 40 mg.   Stress Yes   Intervention Offer individual and/or small group education and counseling on adjustment to heart disease, stress management and health-related lifestyle change. Teach and support self-help strategies.;Refer participants experiencing significant psychosocial distress to appropriate mental health specialists for further evaluation and treatment. When possible, include family members and significant others in education/counseling sessions.   Expected Outcomes Short Term: Participant demonstrates changes in health-related behavior, relaxation and other stress management skills, ability to obtain effective social support, and compliance with psychotropic medications if prescribed.;Long Term: Emotional wellbeing is indicated by absence of clinically significant psychosocial distress or social isolation.       Personal Goals Discharge:     Goals and Risk Factor Review    Row Name 05/06/16 1043 05/11/16 1036 06/03/16 1034 06/29/16 1124       Core Components/Risk Factors/Patient Goals Review   Personal Goals Review Weight Management/Obesity;Develop more efficient breathing techniques such as purse lipped breathing and diaphragmatic breathing and  practicing self-pacing with activity.;Improve shortness of breath with ADL's;Diabetes;Hypertension;Lipids Weight Management/Obesity Weight Management/Obesity;Hypertension;Diabetes;Lipids Weight Management/Obesity;Improve shortness of breath with ADL's;Diabetes;Hypertension;Lipids    Review Antony has been doing well in rehab.  He is still struggling with weight gain and shortness of breath.  He went to the [place] last week and they told him to keep exercising and drink plenty of water.  Since his weight was up again today and his breathing continues to not improve, I encouraged him to call them again to let them know he has  gained 4 lbs in a week.  We did review how to do pursed lip and diaphragmatic breathing to help with breath control during activity.  His blood sugars and statins have been doing well and stay stable. However, his blood pressures continue to be all over the map, no consistentsy.  He says that he is checking it at home and keeping a log which I encouraged him to show to his doctor at his next visit.   He talked to his doctor last week about his weight gain.  They did not feel that it was heart failure, but rather that he needed to control his eating and portion sizes better. Micajah went to ER last week and was found to have fluid in his lungs.  He started Lasix and is feeling better.  His blood pressures are good and stable.  Blood sugars have beena little high, but trending down.  He is still eating more sweets than he should.  No problems with his statins. Ashan is doinf well with weight today. Is  back at 260  lbs after having gained four pounds. He is working on his nutrition goals and is doing well with some of the changes he made. Less butter and salt on his vegetables. BP,  Blood [place], Lipids are maintaining control with his meds, exercise and changes in his eating habits. Less sodium with his meals. HIs SOB is improving as he increases his exercise levels. Grigor can see a difference with the  SOB and his activities that the SOB is decreased. His MD has decreased the ampount of medications from 36 pills a day to 28 pills a day.     Expected Outcomes Short: Call doctor about shortness of breath and weight gain.  Long: Continue to work on weight loss, risk factors, and pursed lip breathing.   Short: Has appt with dietician today.  Long: Work on improved diet and control. Short: Continue to work on weight loss.  Long: Improve diet and control. Short: Continue to work on weight loss.  Long: Improve with use of nutrition plan. Continue with exercise routine for risk factor control and improved SOB symptoms.   Long TErm: Maintenance of exercise and nutrition plan for continued risk factor control       Nutrition & Weight - Outcomes:     Pre Biometrics - 04/12/16 1536      Pre Biometrics   Height 6\' 1"  (1.854 m)   Weight 260 lb 12.8 oz (118.3 kg)   Waist Circumference 47 inches   Hip Circumference 47 inches   Waist to Hip Ratio 1 %   BMI (Calculated) 34.5   Single Leg Stand 9.28 seconds       Nutrition:     Nutrition Therapy & Goals - 05/11/16 1108      Nutrition Therapy   Diet Instructed patient accompanied by his wife on a meal plan based on 2000 calories including heart healthy dietary guidelines as well as diabetes dietary guidelines.   Drug/Food Interactions Statins/Certain Fruits   Protein (specify units) 8 ounces   Fiber 30 grams   Whole Grain Foods 3 servings   Saturated Fats 13 max. grams   Fruits and Vegetables 5 servings/day   Sodium 2000 grams  1500mg  idfeal     Personal Nutrition Goals   Nutrition Goal Increase fruits and vegetables to at least 5 servings daily.   Personal Goal #2 Read labels for saturated fat, trans fat and sodium   Personal Goal #3 Rinse  canned vegetables; use fresh or frozen whenever possible.   Personal Goal #4 Balance meals with protein, 3-4 servings of carbohydrate and non-starchy vegetables. Refer to "Diabetes Plate" hand-out and  "Planning a Balanced Meal" handout.     Intervention Plan   Intervention Prescribe, educate and counsel regarding individualized specific dietary modifications aiming towards targeted core components such as weight, hypertension, lipid management, diabetes, heart failure and other comorbidities.;Nutrition handout(s) given to patient.   Expected Outcomes Short Term Goal: Understand basic principles of dietary content, such as calories, fat, sodium, cholesterol and nutrients.;Short Term Goal: A plan has been developed with personal nutrition goals set during dietitian appointment.;Long Term Goal: Adherence to prescribed nutrition plan.      Nutrition Discharge:     Nutrition Assessments - 07/13/16 1033      MEDFICTS Scores   Post Score 75      Education Questionnaire Score:     Knowledge Questionnaire Score - 07/13/16 0831      Knowledge Questionnaire Score   Pre Score 18/28   Post Score 21/28  results reveiwed with pt today      Goals reviewed with patient; copy given to patient.

## 2017-02-19 ENCOUNTER — Emergency Department
Admission: EM | Admit: 2017-02-19 | Discharge: 2017-02-19 | Disposition: A | Discharge status: Home / Self Care | Payer: Medicare HMO | Attending: Emergency Medicine | Admitting: Emergency Medicine

## 2017-02-19 ENCOUNTER — Encounter: Payer: Self-pay | Admitting: Emergency Medicine

## 2017-02-19 ENCOUNTER — Emergency Department: Payer: Medicare HMO

## 2017-02-19 ENCOUNTER — Other Ambulatory Visit: Payer: Self-pay

## 2017-02-19 DIAGNOSIS — I2510 Atherosclerotic heart disease of native coronary artery without angina pectoris: Secondary | ICD-10-CM | POA: Insufficient documentation

## 2017-02-19 DIAGNOSIS — G20 Parkinson's disease: Secondary | ICD-10-CM | POA: Diagnosis not present

## 2017-02-19 DIAGNOSIS — E119 Type 2 diabetes mellitus without complications: Secondary | ICD-10-CM | POA: Insufficient documentation

## 2017-02-19 DIAGNOSIS — Z79899 Other long term (current) drug therapy: Secondary | ICD-10-CM | POA: Insufficient documentation

## 2017-02-19 DIAGNOSIS — R1013 Epigastric pain: Secondary | ICD-10-CM | POA: Insufficient documentation

## 2017-02-19 DIAGNOSIS — Z7901 Long term (current) use of anticoagulants: Secondary | ICD-10-CM | POA: Insufficient documentation

## 2017-02-19 DIAGNOSIS — Z7982 Long term (current) use of aspirin: Secondary | ICD-10-CM | POA: Diagnosis not present

## 2017-02-19 DIAGNOSIS — I10 Essential (primary) hypertension: Secondary | ICD-10-CM | POA: Diagnosis not present

## 2017-02-19 DIAGNOSIS — Z7984 Long term (current) use of oral hypoglycemic drugs: Secondary | ICD-10-CM | POA: Diagnosis not present

## 2017-02-19 DIAGNOSIS — R1010 Upper abdominal pain, unspecified: Secondary | ICD-10-CM

## 2017-02-19 DIAGNOSIS — J449 Chronic obstructive pulmonary disease, unspecified: Secondary | ICD-10-CM | POA: Diagnosis not present

## 2017-02-19 DIAGNOSIS — R0602 Shortness of breath: Secondary | ICD-10-CM | POA: Diagnosis not present

## 2017-02-19 DIAGNOSIS — R079 Chest pain, unspecified: Secondary | ICD-10-CM | POA: Diagnosis not present

## 2017-02-19 DIAGNOSIS — Z951 Presence of aortocoronary bypass graft: Secondary | ICD-10-CM | POA: Insufficient documentation

## 2017-02-19 DIAGNOSIS — R110 Nausea: Secondary | ICD-10-CM | POA: Diagnosis not present

## 2017-02-19 LAB — CBC WITH DIFFERENTIAL/PLATELET
BASOS ABS: 0 10*3/uL (ref 0–0.1)
Basophils Relative: 1 %
EOS PCT: 2 %
Eosinophils Absolute: 0.1 10*3/uL (ref 0–0.7)
HCT: 40.2 % (ref 40.0–52.0)
Hemoglobin: 13.7 g/dL (ref 13.0–18.0)
LYMPHS PCT: 23 %
Lymphs Abs: 1.4 10*3/uL (ref 1.0–3.6)
MCH: 30.4 pg (ref 26.0–34.0)
MCHC: 34.1 g/dL (ref 32.0–36.0)
MCV: 89.2 fL (ref 80.0–100.0)
MONO ABS: 0.6 10*3/uL (ref 0.2–1.0)
Monocytes Relative: 10 %
Neutro Abs: 4 10*3/uL (ref 1.4–6.5)
Neutrophils Relative %: 64 %
PLATELETS: 318 10*3/uL (ref 150–440)
RBC: 4.51 MIL/uL (ref 4.40–5.90)
RDW: 15.1 % — AB (ref 11.5–14.5)
WBC: 6.2 10*3/uL (ref 3.8–10.6)

## 2017-02-19 LAB — COMPREHENSIVE METABOLIC PANEL
ALT: 51 U/L (ref 17–63)
ANION GAP: 9 (ref 5–15)
AST: 55 U/L — ABNORMAL HIGH (ref 15–41)
Albumin: 4.2 g/dL (ref 3.5–5.0)
Alkaline Phosphatase: 95 U/L (ref 38–126)
BUN: 28 mg/dL — ABNORMAL HIGH (ref 6–20)
CHLORIDE: 106 mmol/L (ref 101–111)
CO2: 24 mmol/L (ref 22–32)
Calcium: 9.1 mg/dL (ref 8.9–10.3)
Creatinine, Ser: 1.18 mg/dL (ref 0.61–1.24)
GFR, EST NON AFRICAN AMERICAN: 60 mL/min — AB (ref >60)
Glucose, Bld: 129 mg/dL — ABNORMAL HIGH (ref 65–99)
POTASSIUM: 3.7 mmol/L (ref 3.5–5.1)
Sodium: 139 mmol/L (ref 135–145)
Total Bilirubin: 0.9 mg/dL (ref 0.3–1.2)
Total Protein: 7.3 g/dL (ref 6.5–8.1)

## 2017-02-19 LAB — TROPONIN I: Troponin I: <0.03 ng/mL (ref <0.03)

## 2017-02-19 LAB — LIPASE, BLOOD: LIPASE: 25 U/L (ref 11–51)

## 2017-02-19 MED ORDER — ONDANSETRON HCL 4 MG/2ML IJ SOLN
4.0000 mg | Freq: Once | INTRAMUSCULAR | Status: AC
  Administered 2017-02-19: 4 mg via INTRAVENOUS
  Filled 2017-02-19: qty 2

## 2017-02-19 MED ORDER — MORPHINE SULFATE (PF) 4 MG/ML IV SOLN
4.0000 mg | Freq: Once | INTRAVENOUS | Status: AC
  Administered 2017-02-19: 4 mg via INTRAVENOUS
  Filled 2017-02-19: qty 1

## 2017-02-19 NOTE — ED Notes (Addendum)
Verified that lab had received troponin that was sent. Lab has specimen and will receive and run now.

## 2017-02-19 NOTE — ED Provider Notes (Addendum)
[facility] Emergency Department Provider Note  ____________________________________________   First MD Initiated Contact with Patient 02/19/17 1240     (approximate)  I have reviewed the triage vital signs and the nursing notes.   HISTORY  Chief Complaint Chest Pain   HPI Scott SOLORIO Sr. is a 72 y.o. male with a history of coronary artery disease status post CABG who is presenting to the emergency department with sudden onset epigastric pain that started 45 minutes ago.  He was in the bank, seated when the pain started.  He says the pain was a sudden onset 8 out of 10 pain in his epigastrium.  It is nonradiating.  It is associate with nausea and shortness of breath.  Says that it feels similar to his previous heart attack.  Patient received 324 mg of aspirin prior to arrival.  He took 2 tabs of his own nitroglycerin without relief.  EMS obtained an EKG which did not have any ST elevation and did not meet STEMI criteria.  Patient denies any smoking, drinking or drug use.  However, the patient does state that this pain differs from his heart attack pain because he says his abdomen is tender.  He also says that he has a history of reflux disease but does not know of any ulcer disease.  Of note: EMS reported that when the patient got up from his chair at the bank he saw a bag with 3 clear rocklike objects in it.  They are concerned for drugs such as cocaine or methamphetamine.  The patient has denied using drugs.  Unclear if this bag was the patient's or what the exact contents were.   Past Medical History:  Diagnosis Date  . COPD (chronic obstructive pulmonary disease) (HCC)   . Diabetes mellitus without complication (HCC)   . Hypertension     Patient Active Problem List   Diagnosis Date Noted  . Actinic keratosis 12/28/2015  . Anxiety 12/28/2015  . Benign essential hypertension 12/28/2015  . Benign prostatic hyperplasia 12/28/2015  . Coronary artery  disease 12/28/2015  . Diabetes mellitus (HCC) 12/28/2015  . History of renal stone 12/28/2015  . Hyperlipidemia 12/28/2015  . Insomnia 12/28/2015  . Long term current use of anticoagulant 12/28/2015  . Lumbar radiculopathy 12/28/2015  . Neck pain 12/28/2015  . Osteoarthritis 12/28/2015  . Parkinsonism (HCC) 12/28/2015  . Pulmonary embolism (HCC) 12/28/2015  . Restless legs syndrome 12/28/2015  . Seborrheic dermatitis 12/28/2015  . Vertigo of central origin 12/28/2015    Past Surgical History:  Procedure Laterality Date  . CORONARY ARTERY BYPASS GRAFT    . CORONARY STENT PLACEMENT      Prior to Admission medications   Medication Sig Start Date End Date Taking? Authorizing Provider  acetaminophen (TYLENOL) 500 MG tablet Take 1,000 mg by mouth 3 (three) times daily.     [provider]  albuterol (PROVENTIL HFA;VENTOLIN HFA) 108 (90 Base) MCG/ACT inhaler Inhale 2 puffs into the lungs every 6 (six) hours as needed for wheezing.    [provider]  amiodarone (PACERONE) 200 MG tablet Take 200 mg by mouth daily. 01/21/16   [provider]  apixaban (ELIQUIS) 2.5 MG TABS tablet Take 2.5 mg by mouth 2 (two) times daily.    [provider]  aspirin EC 81 MG tablet Take 81 mg by mouth.    [provider]  atorvastatin (LIPITOR) 80 MG tablet Take 80 mg by mouth daily.    [provider]  Calcium Carbonate-Vitamin D 600-400 MG-UNIT tablet Take 1 tablet by mouth 2 (two) times daily with a meal.    [provider]  cephALEXin (KEFLEX) 500 MG capsule Take 1 capsule (500 mg total) by mouth 4 (four) times daily. 04/02/16   Sharyn Creamer, MD  cetirizine (ZYRTEC) 10 MG tablet Take 10 mg by mouth daily.    [provider]  FLUoxetine (PROZAC) 20 MG tablet Take 80 mg by mouth daily.    [provider]  gabapentin (NEURONTIN) 300 MG capsule Take 600 mg by mouth 3 (three) times daily.    [provider]    glucosamine-chondroitin 500-400 MG tablet Take 1 tablet by mouth 2 (two) times daily.    [provider]  hydrocortisone 2.5 % cream Apply 1 application topically 2 (two) times daily.    [provider]  lisinopril (PRINIVIL,ZESTRIL) 2.5 MG tablet Take 2.5 mg by mouth daily. 01/06/16   [provider]  magnesium oxide (MAG-OX) 400 MG tablet Take 400 mg by mouth 2 (two) times daily.    [provider]  metFORMIN (GLUCOPHAGE) 500 MG tablet Take 500 mg by mouth 2 (two) times daily with a meal.    [provider]  metoprolol succinate (TOPROL-XL) 25 MG 24 hr tablet Take 12.5 mg by mouth daily.  01/12/16   [provider]  mirtazapine (REMERON) 15 MG tablet Take 45 mg by mouth at bedtime.    [provider]  pramipexole (MIRAPEX) 1 MG tablet Take 0.5 mg by mouth every evening.    [provider]  prochlorperazine (COMPAZINE) 5 MG tablet Take 10 mg by mouth every 8 (eight) hours as needed for nausea.    [provider]  ranitidine (ZANTAC) 150 MG tablet Take 150 mg by mouth 2 (two) times daily.    [provider]    Allergies Prednisone and Nitroglycerin  No family history on file.  Social History Social History   Tobacco Use  . Smoking status: Never Smoker  . Smokeless tobacco: Never Used  Substance Use Topics  . Alcohol use: No  . Drug use: Not on file    Review of Systems  Constitutional: No fever/chills Eyes: No visual changes. ENT: No sore throat. Cardiovascular: Above Respiratory: As above Gastrointestinal: no vomiting.  No diarrhea.  No constipation. Genitourinary: Negative for dysuria. Musculoskeletal: Negative for back pain. Skin: Negative for rash. Neurological: Negative for headaches, focal weakness or numbness.   ____________________________________________   PHYSICAL EXAM:  VITAL SIGNS: ED Triage Vitals  Enc Vitals Group     BP      Pulse      Resp      Temp       Temp src      SpO2      Weight      Height      Head Circumference      Peak Flow      Pain Score      Pain Loc      Pain Edu?      Excl. in [place]?     Constitutional: Alert and oriented.  Appears uncomfortable. Eyes: Conjunctivae are normal.  Head: Atraumatic. Nose: No congestion/rhinnorhea. Mouth/Throat: Mucous membranes are moist.  Neck: No stridor.   Cardiovascular: Normal rate, regular rhythm. Grossly normal heart sounds.   Respiratory: Normal respiratory effort.  No retractions. Lungs CTAB. Gastrointestinal: Soft with moderate epigastric tenderness to palpation.  Negative Murphy sign. No distention. No CVA tenderness. Musculoskeletal: No lower  extremity tenderness nor edema.  No joint effusions. Neurologic:  Normal speech and language. No gross focal neurologic deficits are appreciated. Skin:  Skin is warm, dry and intact. No rash noted. Psychiatric: Mood and affect are normal. Speech and behavior are normal.  ____________________________________________   LABS (all labs ordered are listed, but only abnormal results are displayed)  Labs Reviewed  CBC WITH DIFFERENTIAL/PLATELET  COMPREHENSIVE METABOLIC PANEL  TROPONIN I  LIPASE, BLOOD  URINE DRUG SCREEN, QUALITATIVE ([facility] ONLY)   ____________________________________________  EKG  ED ECG REPORT I, Arelia Longestavid M Schaevitz, the attending physician, personally viewed and interpreted this ECG.   Date: 02/19/2017  EKG Time: 1211  Rate: 88  Rhythm: normal sinus rhythm  Axis: normal  Intervals:none  ST&T Change: No ST segment elevation or depression.  T wave inversions in lead I as well as aVL.  ____________________________________________  RADIOLOGY  Probable lingular atelectasis or pneumonia with small left pleural effusion.  Mildly thickened small bowel folds and left mid abdomen. ____________________________________________   PROCEDURES  Procedure(s) performed:   Procedures  Critical Care performed:    ____________________________________________   INITIAL IMPRESSION / ASSESSMENT AND PLAN / ED COURSE  Pertinent labs & imaging results that were available during my care of the patient were reviewed by me and considered in my medical decision making (see chart for details).  Differential diagnosis includes, but is not limited to, biliary disease (biliary colic, acute cholecystitis, cholangitis, choledocholithiasis, etc), intrathoracic causes for epigastric abdominal pain including ACS, gastritis, duodenitis, pancreatitis, small bowel or large bowel obstruction, abdominal aortic aneurysm, hernia, and gastritis. As part of my medical decision making, I reviewed the following data within the electronic MEDICAL RECORD NUMBER Notes from prior ED visits  ----------------------------------------- 12:45 PM on 02/19/2017 -----------------------------------------  Differential includes coronary artery disease/ACS as well as perforated gastric ulcer.  Patient already has taken aspirin as well as 2 nitro without any relief.  We will move on to morphine at this time.  Clinical Course as of Feb 19 1654  Sat Feb 19, 2017  1515 Patient is now pain-free.  Reexamined his abdomen and throughout.  He thinks that his pains may have been related to gas.  We will check another troponin and if it is continuing to be undetectable the patient will be discharged home.  [DS]    Clinical Course User Index [DS] Myrna BlazerSchaevitz, David Matthew, MD   ----------------------------------------- 4:56 PM on 02/19/2017 -----------------------------------------  Patient continues to be pain-free.  Eating and drinking without any issue at this time.  Possibly related to gas pains initially.  Very reassuring appearance at this time as well as very reassuring lab and imaging workup.  Possibility of thickened small bowel folds in the mid abdomen.  However, due to the patient being astigmatic at this time I am doubtful of any serious  pathology related to this finding.  [facility]  He is understanding of the plan willing to comply.  Will be discharged at this time.  Likely scarring on the chest x-ray versus pneumonia.  Patient without cough, fever.  Normal white blood cell count.  Also less likely to be drug abuse.  Patient had denied this.  Not tachycardic.  Does not appear to be in a sympathomimetic toxidrome.  ____________________________________________   FINAL CLINICAL IMPRESSION(S) / ED DIAGNOSES  Upper abdominal pain.  Chest pain.    NEW MEDICATIONS STARTED DURING THIS VISIT:  New Prescriptions   No medications on file  Note:  This document was prepared using Dragon voice recognition software and may include unintentional dictation errors.     Myrna Blazer, MD 02/19/17 1658    Schaevitz, Myra Rude, MD 02/19/17 [phone]

## 2017-02-19 NOTE — ED Triage Notes (Signed)
Pt to ED via [facility] from the bank. Pt was at bank making a transaction when he started having chest pain around 1155. Pt has cardiac hx. Pt c/o 8/10 pain in the center of his chest. Patient denies radiation of pain. Pt states that he is short of breath and that he is having nausea with no vomiting. Pt also reports that when episode first started that he has episode of diaphoresis.   EMS reports that when they moved patient from chair to stretcher that they found a bag with 2 crystals in it that appeared to be crack, EMS is unsure if this belonged to patient. Pt denies any drug use

## 2017-10-06 ENCOUNTER — Ambulatory Visit: Payer: Self-pay | Admitting: Podiatry

## 2017-10-13 ENCOUNTER — Encounter: Payer: Self-pay | Admitting: Podiatry

## 2017-10-13 ENCOUNTER — Ambulatory Visit (INDEPENDENT_AMBULATORY_CARE_PROVIDER_SITE_OTHER): Payer: Non-veteran care | Admitting: Podiatry

## 2017-10-13 DIAGNOSIS — I10 Essential (primary) hypertension: Secondary | ICD-10-CM | POA: Insufficient documentation

## 2017-10-13 DIAGNOSIS — D689 Coagulation defect, unspecified: Secondary | ICD-10-CM

## 2017-10-13 DIAGNOSIS — B351 Tinea unguium: Secondary | ICD-10-CM | POA: Diagnosis not present

## 2017-10-13 DIAGNOSIS — E1142 Type 2 diabetes mellitus with diabetic polyneuropathy: Secondary | ICD-10-CM

## 2017-10-13 DIAGNOSIS — I509 Heart failure, unspecified: Secondary | ICD-10-CM | POA: Insufficient documentation

## 2017-10-13 DIAGNOSIS — M79674 Pain in right toe(s): Secondary | ICD-10-CM

## 2017-10-13 DIAGNOSIS — M79675 Pain in left toe(s): Secondary | ICD-10-CM | POA: Diagnosis not present

## 2017-10-13 NOTE — Progress Notes (Signed)
This patient presents to the office with chief complaint of long thick nails and diabetic feet.  This patient  says there  is  no pain and discomfort in his  feet.  This patient says there are long thick painful nails.  These nails are painful walking and wearing shoes.  Patient has no history of infection or drainage from both feet.  Patient is having difficulty   self treating  his own nails . This patient presents  to the office today for treatment of the  long nails and a foot evaluation due to history of  Diabetes. Patient is taking eliquiss.  General Appearance  Alert, conversant and in no acute stress.  Vascular  Dorsalis pedis and posterior tibial  pulses are palpable  bilaterally.  Capillary return is within normal limits  bilaterally. Temperature is within normal limits  bilaterally.  Neurologic  Senn-Weinstein monofilament wire test diminished   bilaterally. Muscle power within normal limits bilaterally.  Nails Thick disfigured discolored nails with subungual debris  from hallux to fifth toes bilaterally. No evidence of bacterial infection or drainage bilaterally.  Orthopedic  No limitations of motion of motion feet .  No crepitus or effusions noted.  No bony pathology or digital deformities noted.  Skin  normotropic skin with no porokeratosis noted bilaterally.  No signs of infections or ulcers noted.     Onychomycosis  Diabetes with no foot complications  IE  Debride nails x 10.  A diabetic foot exam was performed and there is no evidence of any vascular  Pathology.  Diminished LOPS noted.   RTC 3 months.   Helane Gunther DPM

## 2017-12-22 ENCOUNTER — Telehealth: Payer: Self-pay | Admitting: Podiatry

## 2017-12-22 NOTE — Telephone Encounter (Signed)
Please send [facility] notes to [phone] Attn Team B

## 2018-05-01 ENCOUNTER — Ambulatory Visit: Payer: Non-veteran care | Admitting: Podiatry

## 2018-05-18 ENCOUNTER — Ambulatory Visit: Payer: Non-veteran care | Admitting: Podiatry

## 2018-07-29 ENCOUNTER — Encounter: Payer: Self-pay | Admitting: Emergency Medicine

## 2018-07-29 ENCOUNTER — Emergency Department
Admission: EM | Admit: 2018-07-29 | Discharge: 2018-07-29 | Disposition: A | Discharge status: Home / Self Care | Payer: No Typology Code available for payment source | Attending: Emergency Medicine | Admitting: Emergency Medicine

## 2018-07-29 DIAGNOSIS — Z7901 Long term (current) use of anticoagulants: Secondary | ICD-10-CM | POA: Diagnosis not present

## 2018-07-29 DIAGNOSIS — I2510 Atherosclerotic heart disease of native coronary artery without angina pectoris: Secondary | ICD-10-CM | POA: Diagnosis not present

## 2018-07-29 DIAGNOSIS — Z7984 Long term (current) use of oral hypoglycemic drugs: Secondary | ICD-10-CM | POA: Diagnosis not present

## 2018-07-29 DIAGNOSIS — Z951 Presence of aortocoronary bypass graft: Secondary | ICD-10-CM | POA: Diagnosis not present

## 2018-07-29 DIAGNOSIS — J449 Chronic obstructive pulmonary disease, unspecified: Secondary | ICD-10-CM | POA: Diagnosis not present

## 2018-07-29 DIAGNOSIS — I509 Heart failure, unspecified: Secondary | ICD-10-CM | POA: Insufficient documentation

## 2018-07-29 DIAGNOSIS — T675XXA Heat exhaustion, unspecified, initial encounter: Secondary | ICD-10-CM | POA: Diagnosis not present

## 2018-07-29 DIAGNOSIS — Z7982 Long term (current) use of aspirin: Secondary | ICD-10-CM | POA: Diagnosis not present

## 2018-07-29 DIAGNOSIS — Z79899 Other long term (current) drug therapy: Secondary | ICD-10-CM | POA: Insufficient documentation

## 2018-07-29 DIAGNOSIS — I110 Hypertensive heart disease with heart failure: Secondary | ICD-10-CM | POA: Diagnosis not present

## 2018-07-29 DIAGNOSIS — T671XXA Heat syncope, initial encounter: Secondary | ICD-10-CM | POA: Diagnosis not present

## 2018-07-29 DIAGNOSIS — E119 Type 2 diabetes mellitus without complications: Secondary | ICD-10-CM | POA: Insufficient documentation

## 2018-07-29 DIAGNOSIS — Z955 Presence of coronary angioplasty implant and graft: Secondary | ICD-10-CM | POA: Insufficient documentation

## 2018-07-29 DIAGNOSIS — E860 Dehydration: Secondary | ICD-10-CM | POA: Diagnosis not present

## 2018-07-29 DIAGNOSIS — R55 Syncope and collapse: Secondary | ICD-10-CM | POA: Diagnosis not present

## 2018-07-29 DIAGNOSIS — T673XXA Heat exhaustion, anhydrotic, initial encounter: Secondary | ICD-10-CM | POA: Diagnosis not present

## 2018-07-29 LAB — CBC WITH DIFFERENTIAL/PLATELET
Abs Immature Granulocytes: 0.07 10*3/uL (ref 0.00–0.07)
Basophils Absolute: 0 10*3/uL (ref 0.0–0.1)
Basophils Relative: 0 %
Eosinophils Absolute: 0.2 10*3/uL (ref 0.0–0.5)
Eosinophils Relative: 2 %
HCT: 37.8 % — ABNORMAL LOW (ref 39.0–52.0)
Hemoglobin: 12.2 g/dL — ABNORMAL LOW (ref 13.0–17.0)
Immature Granulocytes: 1 %
Lymphocytes Relative: 20 %
Lymphs Abs: 1.4 10*3/uL (ref 0.7–4.0)
MCH: 29.2 pg (ref 26.0–34.0)
MCHC: 32.3 g/dL (ref 30.0–36.0)
MCV: 90.4 fL (ref 80.0–100.0)
Monocytes Absolute: 0.7 10*3/uL (ref 0.1–1.0)
Monocytes Relative: 10 %
Neutro Abs: 4.7 10*3/uL (ref 1.7–7.7)
Neutrophils Relative %: 67 %
Platelets: 292 10*3/uL (ref 150–400)
RBC: 4.18 MIL/uL — ABNORMAL LOW (ref 4.22–5.81)
RDW: 15.4 % (ref 11.5–15.5)
WBC: 7.1 10*3/uL (ref 4.0–10.5)
nRBC: 0 % (ref 0.0–0.2)

## 2018-07-29 LAB — COMPREHENSIVE METABOLIC PANEL
ALT: 44 U/L (ref 0–44)
AST: 48 U/L — ABNORMAL HIGH (ref 15–41)
Albumin: 3.9 g/dL (ref 3.5–5.0)
Alkaline Phosphatase: 89 U/L (ref 38–126)
Anion gap: 11 (ref 5–15)
BUN: 19 mg/dL (ref 8–23)
CO2: 22 mmol/L (ref 22–32)
Calcium: 8.7 mg/dL — ABNORMAL LOW (ref 8.9–10.3)
Chloride: 103 mmol/L (ref 98–111)
Creatinine, Ser: 1.19 mg/dL (ref 0.61–1.24)
GFR calc Af Amer: >60 mL/min (ref >60)
GFR calc non Af Amer: 60 mL/min — ABNORMAL LOW (ref >60)
Glucose, Bld: 132 mg/dL — ABNORMAL HIGH (ref 70–99)
Potassium: 3.7 mmol/L (ref 3.5–5.1)
Sodium: 136 mmol/L (ref 135–145)
Total Bilirubin: 0.6 mg/dL (ref 0.3–1.2)
Total Protein: 6.9 g/dL (ref 6.5–8.1)

## 2018-07-29 LAB — TROPONIN I (HIGH SENSITIVITY): Troponin I (High Sensitivity): 9 ng/L (ref <18)

## 2018-07-29 MED ORDER — SODIUM CHLORIDE 0.9 % IV BOLUS
500.0000 mL | Freq: Once | INTRAVENOUS | Status: AC
  Administered 2018-07-29: 500 mL via INTRAVENOUS

## 2018-07-29 NOTE — ED Triage Notes (Signed)
Pt to ED by EMS. Pt with LOC while outside at protest. Pt states was caught by friends. Pt denies any pain. Pt diaphoretic and face flushed. Pt had 233ml bolus in route. BP 148/84 on arrival.

## 2018-07-29 NOTE — ED Notes (Signed)
Pt verbalized understanding of discharge instructions. NAD at this time. 

## 2018-07-29 NOTE — ED Provider Notes (Signed)
[facility] Emergency Department Provider Note   ____________________________________________    I have reviewed the triage vital signs and the nursing notes.   HISTORY  Chief Complaint Loss of Consciousness     HPI Scott RandJames E Seiber Sr. is a 73 y.o. male with a history of COPD, diabetes, hypertension, vasovagal syncope who presents after a near syncopal episode.  Patient reports that he was taking part in an outdoor protest in extreme heat and humidity today.  He was wearing dark clothes started to feel quite lightheaded and had to sit down.  Brought by EMS, received IV fluids in route and is feeling better now.  No chest pain no palpitations no shortness of breath.  Past Medical History:  Diagnosis Date  . COPD (chronic obstructive pulmonary disease) (HCC)   . Diabetes mellitus without complication (HCC)   . Hypertension     Patient Active Problem List   Diagnosis Date Noted  . CHF (congestive heart failure) (HCC) 10/13/2017  . Hypertension 10/13/2017  . COPD (chronic obstructive pulmonary disease) (HCC) 01/30/2016  . Vasovagal syncope 01/21/2016  . 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 Actinic keratosis 12/28/2015  . Anxiety 12/28/2015  . Benign essential hypertension 12/28/2015  . Benign prostatic hyperplasia 12/28/2015  . Coronary artery disease 12/28/2015  . Diabetes mellitus (HCC) 12/28/2015  . History of renal stone 12/28/2015  . Hyperlipidemia 12/28/2015  . Insomnia 12/28/2015  . Long term current use of anticoagulant 12/28/2015  . Lumbar radiculopathy 12/28/2015  . Neck pain 12/28/2015  . Osteoarthritis 12/28/2015  . Parkinsonism (HCC) 12/28/2015  . Pulmonary embolism (HCC) 12/28/2015  . Restless legs syndrome 12/28/2015  . Seborrheic dermatitis 12/28/2015  . Vertigo of central origin 12/28/2015    Past Surgical History:  Procedure Laterality  Date  . CORONARY ARTERY BYPASS GRAFT    . CORONARY STENT PLACEMENT      Prior to Admission medications   Medication Sig Start Date End Date Taking? Authorizing Provider  acetaminophen (TYLENOL) 500 MG tablet Take 1,000 mg by mouth 3 (three) times daily.     [provider]  albuterol (PROVENTIL HFA;VENTOLIN HFA) 108 (90 Base) MCG/ACT inhaler Inhale 2 puffs into the lungs every 6 (six) hours as needed for wheezing.    [provider]  amiodarone (PACERONE) 200 MG tablet Take 200 mg by mouth daily. 01/21/16   [provider]  apixaban (ELIQUIS) 2.5 MG TABS tablet Take 2.5 mg by mouth 2 (two) times daily.    [provider]  aspirin EC 81 MG tablet Take 81 mg by mouth.    [provider]  atorvastatin (LIPITOR) 80 MG tablet Take 80 mg by mouth daily.    [provider]  Calcium Carbonate-Vitamin D 600-400 MG-UNIT tablet Take 1 tablet by mouth 2 (two) times daily with a meal.    [provider]  cetirizine (ZYRTEC) 10 MG tablet Take 10 mg by mouth daily.    [provider]  FLUoxetine (PROZAC) 20 MG tablet Take 80 mg by mouth daily.    [provider]  gabapentin (NEURONTIN) 300 MG capsule Take 600 mg by mouth 3 (three) times daily.    [provider]  glucosamine-chondroitin 500-400 MG tablet Take 1 tablet by mouth 2 (two) times daily.    [provider]  hydrocortisone 2.5 % cream Apply 1 application topically 2 (two) times daily.    [provider]  Lactobacillus (LACTINEX) PACK Take by mouth.    [provider]  lisinopril (PRINIVIL,ZESTRIL) 2.5 MG tablet Take 2.5 mg by mouth daily. 01/06/16   [provider]  magnesium oxide (MAG-OX) 400 MG tablet Take 400 mg by mouth 2 (two) times daily.    [provider]  metFORMIN (GLUCOPHAGE) 500 MG tablet Take 500 mg by mouth 2 (two) times daily with a meal.    [provider]  metoprolol succinate (TOPROL-XL) 25  MG 24 hr tablet Take 12.5 mg by mouth daily.  01/12/16   [provider]  mirtazapine (REMERON) 15 MG tablet Take 45 mg by mouth at bedtime.    [provider]  oxycodone (OXY-IR) 5 MG capsule Take by mouth.    [provider]  pramipexole (MIRAPEX) 1 MG tablet Take 0.5 mg by mouth every evening.    [provider]  prochlorperazine (COMPAZINE) 5 MG tablet Take 10 mg by mouth every 8 (eight) hours as needed for nausea.    [provider]  ranitidine (ZANTAC) 150 MG tablet Take 150 mg by mouth 2 (two) times daily.    [provider]     Allergies Prednisone and Nitroglycerin  History reviewed. No pertinent family history.  Social History Social History   Tobacco Use  . Smoking status: Never Smoker  . Smokeless tobacco: Never Used  Substance Use Topics  . Alcohol use: No  . Drug use: Not on file    Review of Systems  Constitutional: No fever/chills Eyes: No visual changes.  ENT: No sore throat. Cardiovascular: As above Respiratory: Denies shortness of breath. Gastrointestinal: No abdominal pain.     Genitourinary: Negative for dysuria. Musculoskeletal: Negative for back pain. Skin: Negative for rash. Neurological: Negative for headaches or weakness   ____________________________________________   PHYSICAL EXAM:  VITAL SIGNS: ED Triage Vitals  Enc Vitals Group     BP 07/29/18 1344 (!) 149/84     Pulse Rate 07/29/18 1344 87     Resp 07/29/18 1344 18     Temp 07/29/18 1344 99 F (37.2 C)     Temp Source 07/29/18 1344 Oral     SpO2 07/29/18 1344 95 %     Weight 07/29/18 1345 119.7 kg (264 lb)     Height 07/29/18 1345 1.88 m (6\' 2" )     Head Circumference --      Peak Flow --      Pain Score 07/29/18 1345 0     Pain Loc --      Pain Edu? --      Excl. in [place]? --     Constitutional: Alert and oriented. Eyes: Conjunctivae are normal.   Nose: No congestion/rhinnorhea.  Cardiovascular: Normal rate, regular  rhythm. Grossly normal heart sounds.  Good peripheral circulation. Respiratory: Normal respiratory effort.  No retractions. Lungs CTAB. Gastrointestinal: Soft and nontender. No distention.  No CVA tenderness.  Musculoskeletal: No lower extremity tenderness nor edema.  Warm and well perfused Neurologic:  Normal speech and language. No gross focal neurologic deficits are appreciated.  Skin:  Skin is warm, dry and intact. No rash noted. Psychiatric: Mood and affect are normal. Speech and behavior are normal.  ____________________________________________   LABS (all labs ordered are listed, but only abnormal results are displayed)  Labs Reviewed  CBC WITH DIFFERENTIAL/PLATELET - Abnormal; Notable for the following components:      Result Value   RBC 4.18 (*)    Hemoglobin 12.2 (*)    HCT 37.8 (*)  All other components within normal limits  COMPREHENSIVE METABOLIC PANEL - Abnormal; Notable for the following components:   Glucose, Bld 132 (*)    Calcium 8.7 (*)    AST 48 (*)    GFR calc non Af Amer 60 (*)    All other components within normal limits  TROPONIN I (HIGH SENSITIVITY)   ____________________________________________  EKG  ED ECG REPORT I, Jene Everyobert Cordon Gassett, the attending physician, personally viewed and interpreted this ECG.  Date: 07/29/2018  Rhythm: normal sinus rhythm QRS Axis: normal Intervals: normal ST/T Wave abnormalities: normal Narrative Interpretation: no evidence of acute ischemia  ____________________________________________  RADIOLOGY  None ____________________________________________   PROCEDURES  Procedure(s) performed: No  Procedures   Critical Care performed: No ____________________________________________   INITIAL IMPRESSION / ASSESSMENT AND PLAN / ED COURSE  Pertinent labs & imaging results that were available during my care of the patient were reviewed by me and considered in my medical decision making (see chart for details).   Patient presents after near syncopal episode almost certainly related to heat exhaustion, lab work is reassuring, EKG is unremarkable, feels much better after IV fluids, he is anxious to leave.  Orthostatic blood pressure is unremarkable, appropriate for discharge at this time, recommend rest, hydration and staying in cool environment.    ____________________________________________   FINAL CLINICAL IMPRESSION(S) / ED DIAGNOSES  Final diagnoses:  Dehydration  Heat exhaustion, initial encounter        Note:  This document was prepared using Dragon voice recognition software and may include unintentional dictation errors.   Jene EveryKinner, Samarra Ridgely, MD 07/29/18 [id number]

## 2018-11-29 IMAGING — CR DG WRIST COMPLETE 3+V*R*
1 series · 4 of 4 positions shown · non-contrast
Comparison: None.

CLINICAL DATA: Pt states his right foot got caught on a curb and he
fell face first this afternoon. Pain in right wrist, worse with
supination. IV catheter is placed in right hand. No prior injury or
surgery.

EXAM:
RIGHT WRIST - COMPLETE 3+ VIEW

[Series 1: dg wrist complete right · 0.14mm/px · 4 of 4 slices shown]
[im 1/4]
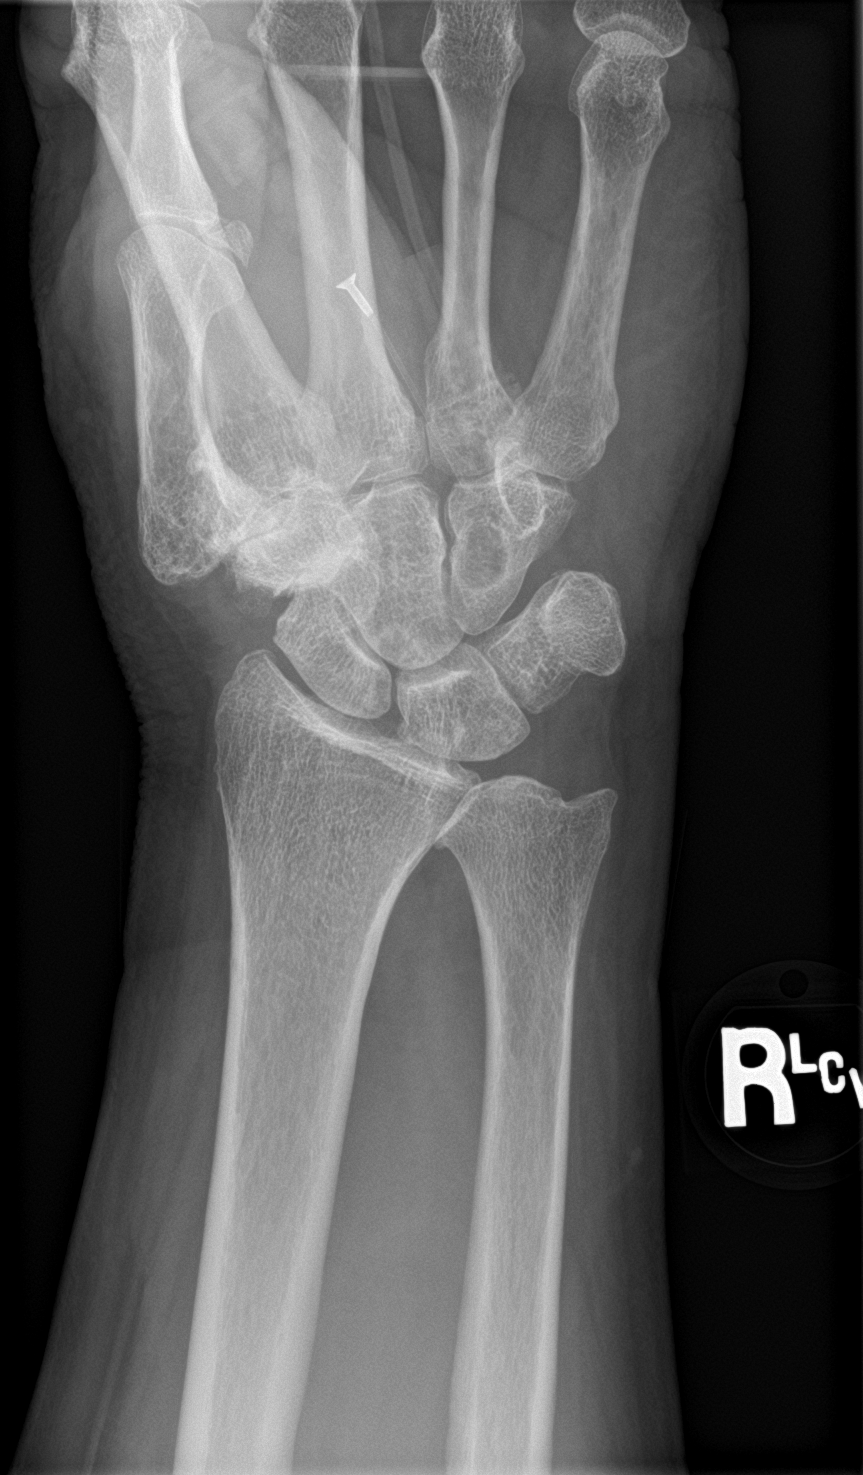
[im 2/4]
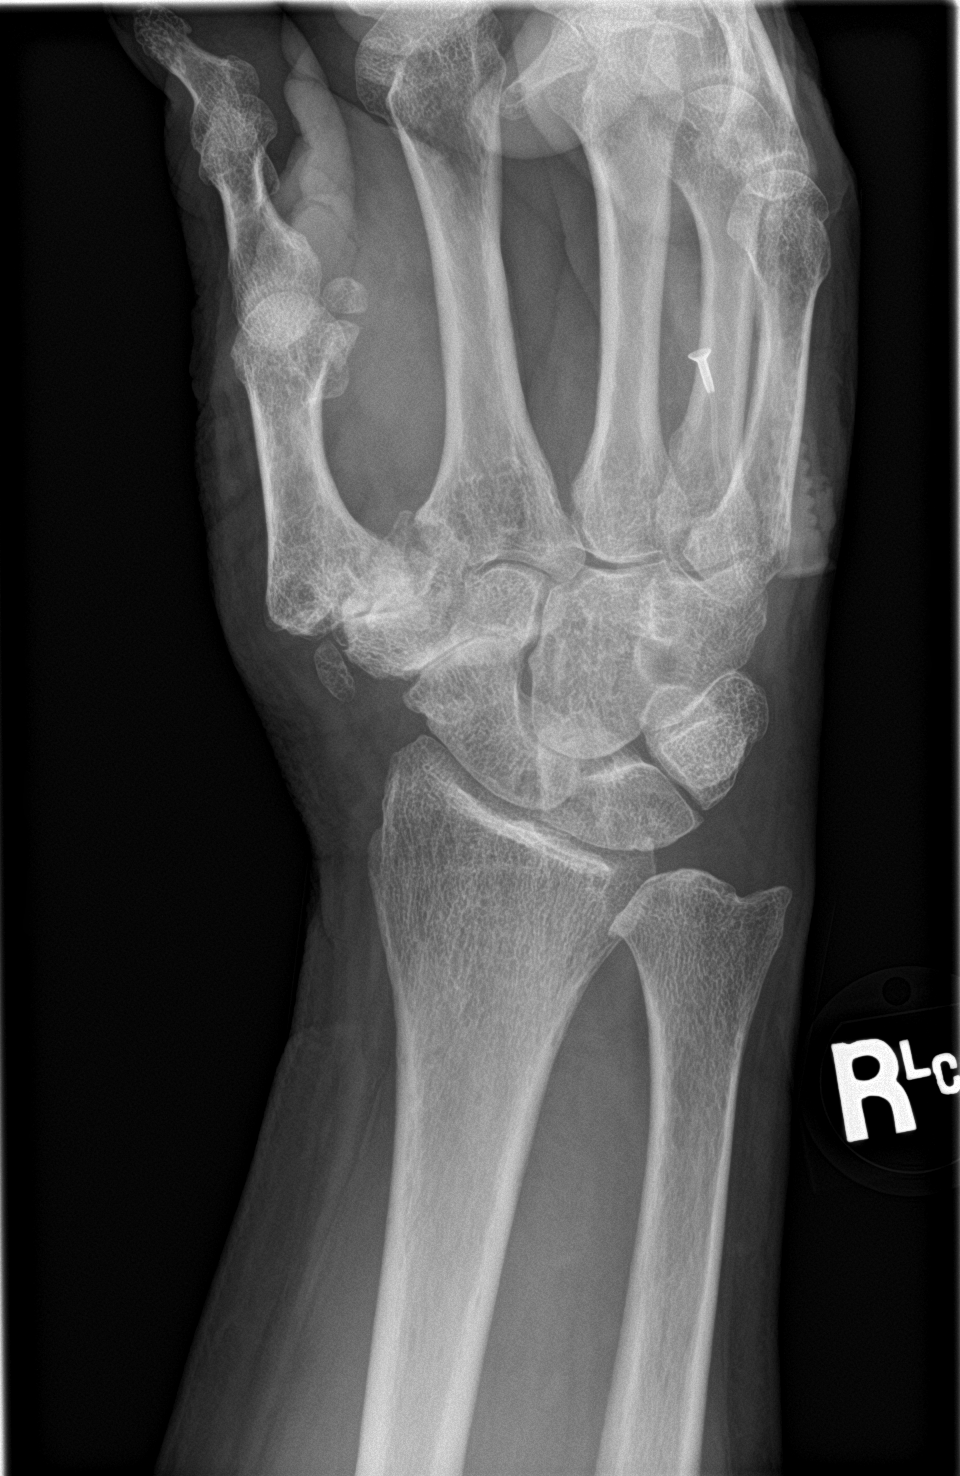
[im 3/4]
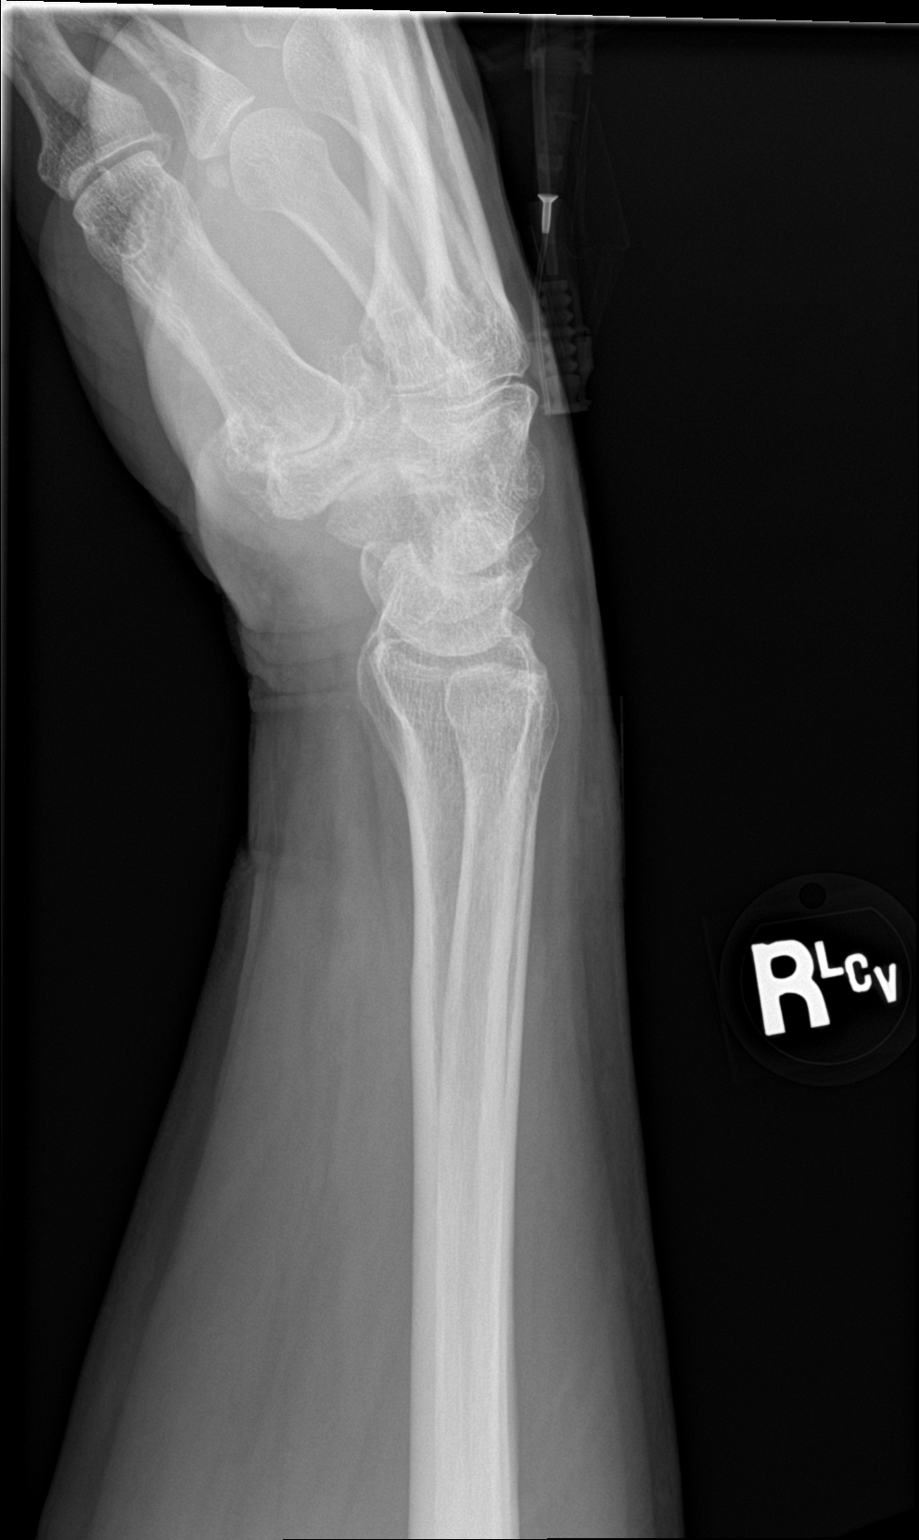
[im 4/4]
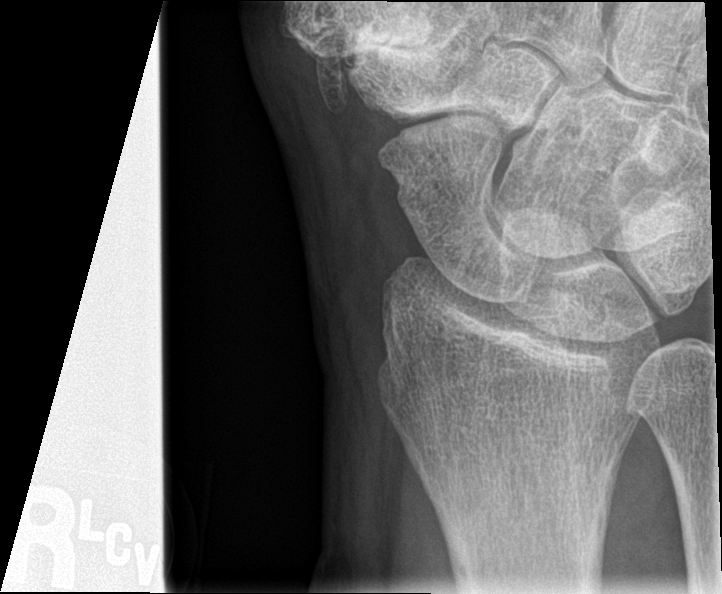

[4 of 4 positions shown; findings below may reference images not displayed]

FINDINGS: No fracture or dislocation.

There is joint space narrowing with mild subchondral sclerosis and
marginal osteophytes at the trapezium first metacarpal articulation,
as well as at scaphoid trapezium articulation. Remaining joints are
normally spaced and aligned.

Bones are demineralized.

Soft tissues are unremarkable.
IMPRESSION: 1. No fracture, dislocation or acute finding.
2. Osteoarthritis involving the first carpometacarpal articulation
and the scaphoid trapezium articulation.

## 2020-02-28 ENCOUNTER — Other Ambulatory Visit: Payer: Self-pay

## 2020-02-28 ENCOUNTER — Encounter: Payer: Self-pay | Admitting: Podiatry

## 2020-02-28 ENCOUNTER — Ambulatory Visit (INDEPENDENT_AMBULATORY_CARE_PROVIDER_SITE_OTHER): Payer: No Typology Code available for payment source | Admitting: Podiatry

## 2020-02-28 DIAGNOSIS — B351 Tinea unguium: Secondary | ICD-10-CM | POA: Diagnosis not present

## 2020-02-28 DIAGNOSIS — M79674 Pain in right toe(s): Secondary | ICD-10-CM | POA: Diagnosis not present

## 2020-02-28 DIAGNOSIS — M79675 Pain in left toe(s): Secondary | ICD-10-CM

## 2020-02-28 DIAGNOSIS — L608 Other nail disorders: Secondary | ICD-10-CM

## 2020-02-28 NOTE — Progress Notes (Signed)
This patient returns to my office for at risk foot care.  This patient requires this care by a professional since this patient will be at risk due to having diabetes and coagulation defect due to eliquis. This patient is unable to cut his big toenails  himself since the patient cannot reach his nails.These nails are painful walking and wearing shoes.  This patient presents for at risk foot care today.  General Appearance  Alert, conversant and in no acute stress.  Vascular  Dorsalis pedis and posterior tibial  pulses are palpable  bilaterally.  Capillary return is within normal limits  bilaterally. Temperature is within normal limits  bilaterally.  Neurologic  Senn-Weinstein monofilament wire test within normal limits  bilaterally. Muscle power within normal limits bilaterally.  Nails Thick disfigured discolored nails with subungual debris  Hallux nails  B/L.  Pincer nails hallux  B/L.Marland Kitchen No evidence of bacterial infection or drainage bilaterally.  Orthopedic  No limitations of motion  feet .  No crepitus or effusions noted.  No bony pathology or digital deformities noted.  Skin  normotropic skin with no porokeratosis noted bilaterally.  No signs of infections or ulcers noted.     Onychomycosis  Pain in right toes  Pain in left toes  Pincer nails hallux  B/L.  Consent was obtained for treatment procedures.   Mechanical debridement of nails 1-5  bilaterally performed with a nail nipper.  Filed with dremel without incident.    Return office visit   prn                  Told patient to return for periodic foot care and evaluation due to potential at risk complications.   Helane Gunther DPM

## 2020-06-19 ENCOUNTER — Ambulatory Visit
Admission: EM | Admit: 2020-06-19 | Discharge: 2020-06-19 | Disposition: A | Discharge status: Home / Self Care | Payer: Medicare HMO

## 2020-06-19 ENCOUNTER — Encounter: Payer: Self-pay | Admitting: Emergency Medicine

## 2020-06-19 ENCOUNTER — Other Ambulatory Visit: Payer: Self-pay

## 2020-06-19 DIAGNOSIS — S61214A Laceration without foreign body of right ring finger without damage to nail, initial encounter: Secondary | ICD-10-CM | POA: Diagnosis not present

## 2020-06-19 DIAGNOSIS — S61212A Laceration without foreign body of right middle finger without damage to nail, initial encounter: Secondary | ICD-10-CM | POA: Diagnosis not present

## 2020-06-19 NOTE — Discharge Instructions (Signed)
We have repaired your small lacerations with skin adhesive.  See the handout about how to care for these wounds.  I have also placed a compressive bandage on as well.  Try to avoid using this hand for couple of days so you do not cause the area to rebleed.  If you have any rebleeding or significant bleeding need to be seen again.  Otherwise, the glue will come off on its own in a couple of days.  Change the bandage every day.  Monitor the area and follow-up if you have any increased redness, swelling or drainage from the area that looks like it could be due to infection.

## 2020-06-19 NOTE — ED Provider Notes (Addendum)
[facility]    CSN: [id number] Arrival date & time: 06/19/20  1410      History   Chief Complaint Chief Complaint  Patient presents with  . Laceration    HPI Scott MORANDI Sr. is a 75 y.o. male presenting for 2 small lacerations of the right middle finger and right ring finger since earlier today.  Patient says that the lacerations did stop bleeding with direct pressure, but if he touches the area at all they start to open back up.  He says that he accidentally cut his hand on a "work knife."  He says it is not a clean knife and he was in his shop using it.  Patient says he takes Eliquis and is worried that he may need to have stitches or some sort of laceration repair.  He denies any significant pain.  He says that he is up-to-date with his tetanus vaccine.  No other injuries or complaints.  HPI  Past Medical History:  Diagnosis Date  . COPD (chronic obstructive pulmonary disease) (HCC)   . Diabetes mellitus without complication (HCC)   . Hypertension     Patient Active Problem List   Diagnosis Date Noted  . Pincer nail deformity 02/28/2020  . Pain due to onychomycosis of toenails of both feet 02/28/2020  . CHF (congestive heart failure) (HCC) 10/13/2017  . Hypertension 10/13/2017  . COPD (chronic obstructive pulmonary disease) (HCC) 01/30/2016  . Vasovagal syncope 01/21/2016  . 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 Actinic keratosis 12/28/2015  . Anxiety 12/28/2015  . Benign essential hypertension 12/28/2015  . Benign prostatic hyperplasia 12/28/2015  . Coronary artery disease 12/28/2015  . Diabetes mellitus (HCC) 12/28/2015  . History of renal stone 12/28/2015  . Hyperlipidemia 12/28/2015  . Insomnia 12/28/2015  . Long term current use of anticoagulant 12/28/2015  . Lumbar radiculopathy 12/28/2015  . Neck pain 12/28/2015  . Osteoarthritis 12/28/2015  . Parkinsonism  (HCC) 12/28/2015  . Pulmonary embolism (HCC) 12/28/2015  . Restless legs syndrome 12/28/2015  . Seborrheic dermatitis 12/28/2015  . Vertigo of central origin 12/28/2015    Past Surgical History:  Procedure Laterality Date  . CORONARY ARTERY BYPASS GRAFT    . CORONARY STENT PLACEMENT         Home Medications    Prior to Admission medications   Medication Sig Start Date End Date Taking? Authorizing Provider  clopidogrel (PLAVIX) 75 MG tablet Take by mouth. 02/01/20  Yes [provider]  acetaminophen (TYLENOL) 500 MG tablet Take 1,000 mg by mouth 3 (three) times daily.    [provider]  albuterol (PROVENTIL HFA;VENTOLIN HFA) 108 (90 Base) MCG/ACT inhaler Inhale 2 puffs into the lungs every 6 (six) hours as needed for wheezing.    [provider]  amiodarone (PACERONE) 200 MG tablet Take 200 mg by mouth daily. 01/21/16   [provider]  apixaban (ELIQUIS) 2.5 MG TABS tablet Take 2.5 mg by mouth 2 (two) times daily.    [provider]  aspirin EC 81 MG tablet Take 81 mg by mouth.    [provider]  atorvastatin (LIPITOR) 80 MG tablet Take 80 mg by mouth daily.    [provider]  Calcium Carbonate-Vitamin D 600-400 MG-UNIT tablet Take 1 tablet by mouth 2 (two) times daily with a meal.    [provider]  cetirizine (ZYRTEC) 10 MG tablet Take 10 mg by mouth daily.  [provider]  FLUoxetine (PROZAC) 20 MG tablet Take 80 mg by mouth daily.    [provider]  gabapentin (NEURONTIN) 300 MG capsule Take 600 mg by mouth 3 (three) times daily.    [provider]  glucosamine-chondroitin 500-400 MG tablet Take 1 tablet by mouth 2 (two) times daily.    [provider]  hydrocortisone 2.5 % cream Apply 1 application topically 2 (two) times daily.    [provider]  Lactobacillus (LACTINEX) PACK Take by mouth.    [provider]  lisinopril (PRINIVIL,ZESTRIL) 2.5 MG  tablet Take 2.5 mg by mouth daily. 01/06/16   [provider]  magnesium oxide (MAG-OX) 400 MG tablet Take 400 mg by mouth 2 (two) times daily.    [provider]  metFORMIN (GLUCOPHAGE) 500 MG tablet Take 500 mg by mouth 2 (two) times daily with a meal.    [provider]  metoprolol succinate (TOPROL-XL) 25 MG 24 hr tablet Take 12.5 mg by mouth daily.  01/12/16   [provider]  mirtazapine (REMERON) 15 MG tablet Take 45 mg by mouth at bedtime.    [provider]  oxycodone (OXY-IR) 5 MG capsule Take by mouth.    [provider]  pramipexole (MIRAPEX) 1 MG tablet Take 0.5 mg by mouth every evening.    [provider]  prochlorperazine (COMPAZINE) 5 MG tablet Take 10 mg by mouth every 8 (eight) hours as needed for nausea.    [provider]  ranitidine (ZANTAC) 150 MG tablet Take 150 mg by mouth 2 (two) times daily.    [provider]    Family History History reviewed. No pertinent family history.  Social History Social History   Tobacco Use  . Smoking status: Never Smoker  . Smokeless tobacco: Never Used  Substance Use Topics  . Alcohol use: No     Allergies   Prednisone, Nitroglycerin, Aripiprazole, Simvastatin, and Terazosin   Review of Systems Review of Systems  Musculoskeletal: Negative for arthralgias and joint swelling.  Skin: Positive for wound. Negative for color change.  Neurological: Negative for weakness and numbness.  Hematological: Bruises/bleeds easily.     Physical Exam Triage Vital Signs ED Triage Vitals  Enc Vitals Group     BP 06/19/20 1455 119/77     Pulse Rate 06/19/20 1455 74     Resp 06/19/20 1455 18     Temp 06/19/20 1455 98.3 F (36.8 C)     Temp Source 06/19/20 1455 Oral     SpO2 06/19/20 1455 97 %     Weight --      Height --      Head Circumference --      Peak Flow --      Pain Score 06/19/20 1453 0     Pain Loc --      Pain Edu? --      Excl. in GC?  --    No data found.  Updated Vital Signs BP 119/77 (BP Location: Left Arm)   Pulse 74   Temp 98.3 F (36.8 C) (Oral)   Resp 18   SpO2 97%        Physical Exam Vitals and nursing note reviewed.  Constitutional:      General: He is not in acute distress.    Appearance: Normal appearance. He is well-developed. He is not ill-appearing.  HENT:     Head: Normocephalic and atraumatic.  Eyes:     General: No scleral  icterus.    Conjunctiva/sclera: Conjunctivae normal.  Cardiovascular:     Rate and Rhythm: Normal rate.     Pulses: Normal pulses.  Pulmonary:     Effort: Pulmonary effort is normal. No respiratory distress.  Musculoskeletal:     Cervical back: Neck supple.  Skin:    General: Skin is warm and dry.     Findings: Lesion (there are 2 very superficial lacerations of the distal finger pads of 3rd and 4th digits of right hand. No contamination. Minimal bleeding but does start to re-bleed after I began cleaing it (middle digit lac)) present.  Neurological:     General: No focal deficit present.     Mental Status: He is alert. Mental status is at baseline.     Motor: No weakness.     Gait: Gait normal.  Psychiatric:        Mood and Affect: Mood normal.        Behavior: Behavior normal.        Thought Content: Thought content normal.      UC Treatments / Results  Labs (all labs ordered are listed, but only abnormal results are displayed) Labs Reviewed - No data to display  EKG   Radiology No results found.  Procedures Laceration Repair  Date/Time: 06/19/2020 4:09 PM Performed by: Shirlee LatchEaves, Greer Wainright B, PA-C Authorized by: Shirlee LatchEaves, Becka Lagasse B, PA-C   Consent:    Consent obtained:  Verbal   Consent given by:  Patient   Risks, benefits, and alternatives were discussed: yes     Risks discussed:  Infection, pain and poor wound healing   Alternatives discussed:  No treatment and delayed treatment Universal protocol:    Patient identity confirmed:  Verbally with  patient Anesthesia:    Anesthesia method:  None Laceration details:    Location:  Finger   Finger location:  R ring finger   Length (cm):  1 Exploration:    Hemostasis achieved with:  Direct pressure   Contaminated: no   Treatment:    Area cleansed with:  Chlorhexidine   Irrigation solution:  Sterile saline   Debridement:  None Skin repair:    Repair method:  Tissue adhesive Approximation:    Approximation:  Close Repair type:    Repair type:  Simple Post-procedure details:    Dressing:  Non-adherent dressing and adhesive bandage   Procedure completion:  Tolerated well, no immediate complications   (including critical care time)  Medications Ordered in UC Medications - No data to display  Initial Impression / Assessment and Plan / UC Course  I have reviewed the triage vital signs and the nursing notes.  Pertinent labs & imaging results that were available during my care of the patient were reviewed by me and considered in my medical decision making (see chart for details).   75 year old male taking Eliquis presenting for 2 very small lacerations of the right hand.  I was able to repair these lacerations with skin adhesive.  Patient tolerated procedure well.  Advised to avoid using the hand for couple of days.  Advised to follow-up for any significant rebleeding.  Advised to follow-up as well if he develops any increased pain, swelling or signs of infection.   Final Clinical Impressions(s) / UC Diagnoses   Final diagnoses:  Laceration of right middle finger without foreign body without damage to nail, initial encounter  Laceration of right ring finger without foreign body, nail damage status unspecified, initial encounter     Discharge Instructions  We have repaired your small lacerations with skin adhesive.  See the handout about how to care for these wounds.  I have also placed a compressive bandage on as well.  Try to avoid using this hand for couple of days so you  do not cause the area to rebleed.  If you have any rebleeding or significant bleeding need to be seen again.  Otherwise, the glue will come off on its own in a couple of days.  Change the bandage every day.  Monitor the area and follow-up if you have any increased redness, swelling or drainage from the area that looks like it could be due to infection.   ED Prescriptions    None     PDMP not reviewed this encounter.   Shirlee Latch, PA-C 06/19/20 1611    Eusebio Friendly B, PA-C 06/19/20 [phone]

## 2020-06-19 NOTE — ED Triage Notes (Signed)
Pt is present today with a laceration on his middle and index finger on the right hand. Pt states that he dropped a knife and when he went to pick it up he cut his two fingers. Pt lacerations stopped bleeding after patient applied pressure. Pt states that he does take a blood thinner.

## 2020-07-27 ENCOUNTER — Emergency Department: Payer: No Typology Code available for payment source

## 2020-07-27 ENCOUNTER — Other Ambulatory Visit: Payer: Self-pay

## 2020-07-27 ENCOUNTER — Emergency Department
Admission: EM | Admit: 2020-07-27 | Discharge: 2020-07-27 | Disposition: A | Discharge status: Home / Self Care | Payer: No Typology Code available for payment source | Attending: Emergency Medicine | Admitting: Emergency Medicine

## 2020-07-27 DIAGNOSIS — W1839XA Other fall on same level, initial encounter: Secondary | ICD-10-CM | POA: Diagnosis not present

## 2020-07-27 DIAGNOSIS — R55 Syncope and collapse: Secondary | ICD-10-CM | POA: Diagnosis not present

## 2020-07-27 DIAGNOSIS — Z7982 Long term (current) use of aspirin: Secondary | ICD-10-CM | POA: Insufficient documentation

## 2020-07-27 DIAGNOSIS — I110 Hypertensive heart disease with heart failure: Secondary | ICD-10-CM | POA: Insufficient documentation

## 2020-07-27 DIAGNOSIS — I25110 Atherosclerotic heart disease of native coronary artery with unstable angina pectoris: Secondary | ICD-10-CM | POA: Insufficient documentation

## 2020-07-27 DIAGNOSIS — Z79899 Other long term (current) drug therapy: Secondary | ICD-10-CM | POA: Diagnosis not present

## 2020-07-27 DIAGNOSIS — G20 Parkinson's disease: Secondary | ICD-10-CM | POA: Insufficient documentation

## 2020-07-27 DIAGNOSIS — Z951 Presence of aortocoronary bypass graft: Secondary | ICD-10-CM | POA: Diagnosis not present

## 2020-07-27 DIAGNOSIS — S20211A Contusion of right front wall of thorax, initial encounter: Secondary | ICD-10-CM

## 2020-07-27 DIAGNOSIS — E119 Type 2 diabetes mellitus without complications: Secondary | ICD-10-CM | POA: Diagnosis not present

## 2020-07-27 DIAGNOSIS — I509 Heart failure, unspecified: Secondary | ICD-10-CM | POA: Diagnosis not present

## 2020-07-27 DIAGNOSIS — Z7901 Long term (current) use of anticoagulants: Secondary | ICD-10-CM | POA: Diagnosis not present

## 2020-07-27 DIAGNOSIS — S299XXA Unspecified injury of thorax, initial encounter: Secondary | ICD-10-CM | POA: Diagnosis present

## 2020-07-27 DIAGNOSIS — J449 Chronic obstructive pulmonary disease, unspecified: Secondary | ICD-10-CM | POA: Diagnosis not present

## 2020-07-27 DIAGNOSIS — Z7984 Long term (current) use of oral hypoglycemic drugs: Secondary | ICD-10-CM | POA: Insufficient documentation

## 2020-07-27 LAB — CBC WITH DIFFERENTIAL/PLATELET
Abs Immature Granulocytes: 0.06 10*3/uL (ref 0.00–0.07)
Basophils Absolute: 0 10*3/uL (ref 0.0–0.1)
Basophils Relative: 1 %
Eosinophils Absolute: 0.1 10*3/uL (ref 0.0–0.5)
Eosinophils Relative: 2 %
HCT: 41.5 % (ref 39.0–52.0)
Hemoglobin: 13.5 g/dL (ref 13.0–17.0)
Immature Granulocytes: 1 %
Lymphocytes Relative: 24 %
Lymphs Abs: 1.5 10*3/uL (ref 0.7–4.0)
MCH: 29.7 pg (ref 26.0–34.0)
MCHC: 32.5 g/dL (ref 30.0–36.0)
MCV: 91.4 fL (ref 80.0–100.0)
Monocytes Absolute: 0.6 10*3/uL (ref 0.1–1.0)
Monocytes Relative: 9 %
Neutro Abs: 4 10*3/uL (ref 1.7–7.7)
Neutrophils Relative %: 63 %
Platelets: 275 10*3/uL (ref 150–400)
RBC: 4.54 MIL/uL (ref 4.22–5.81)
RDW: 15.3 % (ref 11.5–15.5)
WBC: 6.2 10*3/uL (ref 4.0–10.5)
nRBC: 0 % (ref 0.0–0.2)

## 2020-07-27 LAB — BASIC METABOLIC PANEL
Anion gap: 8 (ref 5–15)
BUN: 22 mg/dL (ref 8–23)
CO2: 22 mmol/L (ref 22–32)
Calcium: 8.9 mg/dL (ref 8.9–10.3)
Chloride: 110 mmol/L (ref 98–111)
Creatinine, Ser: 1.1 mg/dL (ref 0.61–1.24)
GFR, Estimated: >60 mL/min (ref >60)
Glucose, Bld: 159 mg/dL — ABNORMAL HIGH (ref 70–99)
Potassium: 3.8 mmol/L (ref 3.5–5.1)
Sodium: 140 mmol/L (ref 135–145)

## 2020-07-27 LAB — TROPONIN I (HIGH SENSITIVITY)
Troponin I (High Sensitivity): 5 ng/L (ref <18)
Troponin I (High Sensitivity): 6 ng/L (ref <18)

## 2020-07-27 MED ORDER — LIDOCAINE 5 % EX PTCH
1.0000 | MEDICATED_PATCH | CUTANEOUS | Status: DC
  Administered 2020-07-27: 1 via TRANSDERMAL
  Filled 2020-07-27: qty 1

## 2020-07-27 MED ORDER — OXYCODONE HCL 5 MG PO TABS
5.0000 mg | ORAL_TABLET | Freq: Once | ORAL | Status: AC
  Administered 2020-07-27: 5 mg via ORAL
  Filled 2020-07-27: qty 1

## 2020-07-27 MED ORDER — OXYCODONE HCL 5 MG PO TABS: 5.0000 mg | ORAL_TABLET | Freq: Three times a day (TID) | ORAL | 0 refills | Status: DC | PRN

## 2020-07-27 MED ORDER — MORPHINE SULFATE (PF) 4 MG/ML IV SOLN
4.0000 mg | Freq: Once | INTRAVENOUS | Status: AC
  Administered 2020-07-27: 4 mg via INTRAVENOUS
  Filled 2020-07-27: qty 1

## 2020-07-27 MED ORDER — LIDOCAINE 5 % EX PTCH: 1.0000 | MEDICATED_PATCH | Freq: Two times a day (BID) | CUTANEOUS | 0 refills | Status: AC

## 2020-07-27 NOTE — ED Provider Notes (Signed)
[facility] Emergency Department Provider Note   ____________________________________________   I have reviewed the triage vital signs and the nursing notes.   HISTORY  Chief Complaint Loss of Consciousness and Fall   History limited by: Not Limited   HPI Scott FLAGG Sr. is a 75 y.o. male who presents to the emergency department today after expriencing a syncopal episode. The patient states that he was standing up when he started feeling lightheaded. He then blacked out and fell down. Thinks he fell onto his right side because he is now having significant pain to his right chest wall and shortness of breath. Denies any chest pain or shortness of breath prior to the fall. Denies any fevers or recent illness. Does not recall passing out in the past.    Records reviewed. Per medical record review patient has a history of COPD, DM, HTN.  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) 12/31/2015   Actinic keratosis 12/28/2015   Anxiety 12/28/2015   Benign essential hypertension 12/28/2015   Benign prostatic hyperplasia 12/28/2015   Coronary artery disease 12/28/2015   Diabetes mellitus (HCC) 12/28/2015   History of renal stone 12/28/2015   Hyperlipidemia 12/28/2015   Insomnia 12/28/2015   Long term current use of anticoagulant 12/28/2015   Lumbar radiculopathy 12/28/2015   Neck pain 12/28/2015   Osteoarthritis  12/28/2015   Parkinsonism (HCC) 12/28/2015   Pulmonary embolism (HCC) 12/28/2015   Restless legs syndrome 12/28/2015   Seborrheic dermatitis 12/28/2015   Vertigo of central origin 12/28/2015    Past Surgical History:  Procedure Laterality Date   CORONARY ARTERY BYPASS GRAFT     CORONARY STENT PLACEMENT      Prior to Admission medications   Medication Sig Start Date End Date Taking? Authorizing Provider  acetaminophen (TYLENOL) 500 MG tablet Take 1,000 mg by mouth 3 (three) times daily.    [provider]  albuterol (PROVENTIL HFA;VENTOLIN HFA) 108 (90 Base) MCG/ACT inhaler Inhale 2 puffs into the lungs every 6 (six) hours as needed for wheezing.    [provider]  amiodarone (PACERONE) 200 MG tablet Take 200 mg by mouth daily. 01/21/16   [provider]  apixaban (ELIQUIS) 2.5 MG TABS tablet Take 2.5 mg by mouth 2 (two) times daily.    [provider]  aspirin EC 81 MG tablet Take 81 mg by mouth.    [provider]  atorvastatin (LIPITOR) 80 MG tablet Take 80 mg by mouth daily.    [provider]  Calcium Carbonate-Vitamin D 600-400 MG-UNIT tablet Take 1 tablet by mouth 2 (two) times daily with a meal.    [provider]  cetirizine (ZYRTEC) 10 MG tablet Take 10 mg by mouth daily.    [provider]  clopidogrel (PLAVIX) 75 MG tablet Take by mouth. 02/01/20   [provider]  FLUoxetine (PROZAC) 20 MG tablet Take 80 mg by mouth daily.    [provider]  gabapentin (NEURONTIN) 300 MG capsule Take 600 mg by mouth 3 (three) times daily.    [provider]  glucosamine-chondroitin 500-400 MG tablet Take 1 tablet by mouth 2 (two) times daily.    [provider]  hydrocortisone 2.5 % cream Apply 1 application topically 2 (two) times daily.    [provider]  Lactobacillus (LACTINEX) PACK Take by mouth.    [provider]  lisinopril (PRINIVIL,ZESTRIL) 2.5 MG tablet  Take 2.5 mg by mouth daily. 01/06/16   [provider]  magnesium oxide (MAG-OX) 400 MG tablet Take 400 mg by mouth 2 (two) times daily.    [provider]  metFORMIN (GLUCOPHAGE) 500 MG tablet Take 500 mg by mouth 2 (two) times daily with a meal.    [provider]  metoprolol succinate (TOPROL-XL) 25 MG 24 hr tablet Take 12.5 mg by mouth daily.  01/12/16   [provider]  mirtazapine (REMERON) 15 MG tablet Take 45 mg by mouth at bedtime.    [provider]  oxycodone (OXY-IR) 5 MG capsule Take by mouth.    [provider]  pramipexole (MIRAPEX) 1 MG tablet Take 0.5 mg by mouth every evening.    [provider]  prochlorperazine (COMPAZINE) 5 MG tablet Take 10 mg by mouth every 8 (eight) hours as needed for nausea.    [provider]  ranitidine (ZANTAC) 150 MG tablet Take 150 mg by mouth 2 (two) times daily.    [provider]    Allergies Prednisone, Nitroglycerin, Aripiprazole, Simvastatin, and Terazosin  History reviewed. No pertinent family history.  Social History Social History   Tobacco Use   Smoking status: Never   Smokeless tobacco: Never  Substance Use Topics   Alcohol use: No    Review of Systems Constitutional: No fever/chills Eyes: No visual changes. ENT: No sore throat. Cardiovascular: Positive for right sided chest pain. Respiratory: Positive for shortness of breath. Gastrointestinal: No abdominal pain.  No nausea, no vomiting.  No diarrhea.   Genitourinary: Negative for dysuria. Musculoskeletal: Negative for back pain. Skin: Negative for rash. Neurological: Negative for headaches, focal weakness or numbness.  ____________________________________________   PHYSICAL EXAM:  VITAL SIGNS: ED Triage Vitals  Enc Vitals Group     BP 07/27/20 1100 135/84     Pulse Rate 07/27/20 1100 64     Resp 07/27/20 1100 19     Temp 07/27/20 1100 98 F (36.7 C)     Temp Source 07/27/20  1100 Oral     SpO2 07/27/20 1100 98 %     Weight 07/27/20 1101 264 lb (119.7 kg)     Height 07/27/20 1101 6\' 1"  (1.854 m)   Constitutional: Alert and oriented.  Eyes: Conjunctivae are normal.  ENT      Head: Normocephalic and atraumatic.      Nose: No congestion/rhinnorhea.      Mouth/Throat: Mucous membranes are moist.      Neck: No stridor. Hematological/Lymphatic/Immunilogical: No cervical lymphadenopathy. Cardiovascular: Normal rate, regular rhythm.  No murmurs, rubs, or gallops.  Respiratory: Normal respiratory effort without tachypnea nor retractions. Breath sounds are clear and equal bilaterally. No wheezes/rales/rhonchi. Gastrointestinal: Soft and non tender. No rebound. No guarding.  Genitourinary: Deferred Musculoskeletal: Normal range of motion in all extremities. No lower extremity edema. Neurologic:  Normal speech and language. No gross focal neurologic deficits are appreciated.  Skin:  Abrasion and bruising to right chest wall. Psychiatric: Mood and affect are normal. Speech and behavior are normal.  Patient exhibits appropriate insight and judgment.  ____________________________________________    LABS (pertinent positives/negatives)  CBC wbc 6.2, hgb 13.5, plt 275 BMP wnl except glu 159 Trop hs 5-6  ____________________________________________   EKG  I, Phineas Semen, attending physician, personally viewed and interpreted this EKG  EKG Time: 1059 Rate: 64 Rhythm: sinus rhythm Axis: left axis deviation Intervals: qtc 436 QRS: narrow, RSR' in V1, V2 ST changes: no st elevation Impression: abnormal ekg  ____________________________________________    RADIOLOGY  CXR  No acute abnormality  ____________________________________________   PROCEDURES  Procedures  ____________________________________________   INITIAL IMPRESSION / ASSESSMENT AND PLAN / ED COURSE  Pertinent labs & imaging results that were available during my care of the  patient were reviewed by me and considered in my medical decision making (see chart for details).   Patient presented to the emergency department today because of concern for syncopal episode and right sided chest pain. Does have abrasions to the right chest wall. CXR without ptx or obvious rib fracture. Troponin negative times two. No anemia or concerning electrolyte abnormality. At this time doubt PE, I do think chest pain related to trauma. Will plan on discharging home with incentive spirometry and pain medication.    ____________________________________________   FINAL CLINICAL IMPRESSION(S) / ED DIAGNOSES  Final diagnoses:  Syncope, unspecified syncope type  Contusion of rib on right side, initial encounter     Note: This dictation was prepared with Dragon dictation. Any transcriptional errors that result from this process are unintentional     Phineas Semen, MD 07/27/20 1547

## 2020-07-27 NOTE — Discharge Instructions (Addendum)
Please seek medical attention for any high fevers, chest pain, shortness of breath, change in behavior, persistent vomiting, bloody stool or any other new or concerning symptoms.  

## 2020-07-27 NOTE — ED Notes (Signed)
Incentive spirometry education done.

## 2020-07-27 NOTE — ED Triage Notes (Addendum)
Pt BIB ems for syncopal episode. Pt remembered feeling dizzy and everything went black. Reports right sided rib pain and SOB post fall, denies hitting head, no chest pain, denies other symptoms. Takes Eliquis h/o Bypass sx, 7 stents.

## 2021-01-09 ENCOUNTER — Other Ambulatory Visit: Payer: Self-pay | Admitting: Orthopedic Surgery

## 2021-01-09 ENCOUNTER — Encounter: Payer: Self-pay | Admitting: Orthopedic Surgery

## 2021-01-09 ENCOUNTER — Other Ambulatory Visit: Payer: Self-pay

## 2021-01-09 ENCOUNTER — Encounter
Admission: RE | Admit: 2021-01-09 | Discharge: 2021-01-09 | Disposition: A | Discharge status: Home / Self Care | Payer: Non-veteran care | Source: Ambulatory Visit | Attending: Orthopedic Surgery | Admitting: Orthopedic Surgery

## 2021-01-09 DIAGNOSIS — I10 Essential (primary) hypertension: Secondary | ICD-10-CM

## 2021-01-09 DIAGNOSIS — I25700 Atherosclerosis of coronary artery bypass graft(s), unspecified, with unstable angina pectoris: Secondary | ICD-10-CM

## 2021-01-09 DIAGNOSIS — E119 Type 2 diabetes mellitus without complications: Secondary | ICD-10-CM

## 2021-01-09 DIAGNOSIS — J449 Chronic obstructive pulmonary disease, unspecified: Secondary | ICD-10-CM

## 2021-01-09 NOTE — Patient Instructions (Signed)
Your procedure is scheduled on:1-5-23Thursday Report to the Registration Desk on the 1st floor of the Medical Mall.Then proceed to the 2nd floor Surgery Desk in the Medical Mall To find out your arrival time, please call [phone] between 1PM - 3PM on:01-21-21 Wednesday  REMEMBER: Instructions that are not followed completely may result in serious medical risk, up to and including death; or upon the discretion of your surgeon and anesthesiologist your surgery may need to be rescheduled.  Do not eat food after midnight the night before surgery.  No gum chewing, lozengers or hard candies.  You may however, drink Water up to 2 hours before you are scheduled to arrive for your surgery. Do not drink anything within 2 hours of your scheduled arrival time.  Type 1 and Type 2 diabetics should only drink water.  TAKE THESE MEDICATIONS THE MORNING OF SURGERY WITH A SIP OF WATER: -finasteride (PROSCAR) -FLUoxetine (PROZAC)  -metoprolol succinate (TOPROL-XL) -omeprazole (PRILOSEC)-take one the night before and one on the morning of surgery - helps to prevent nausea after surgery.)  Stop your metFORMIN (GLUCOPHAGE) 2 days prior to surgery-Last dose on 01-19-21 Monday  Call Dr Samuel Germany office today (01-09-21) to find out when you need to stop your apixaban (ELIQUIS) and clopidogrel (PLAVIX)   One week prior to surgery: Stop Anti-inflammatories (NSAIDS) such as Advil, Aleve, Ibuprofen, Motrin, Naproxen, Naprosyn and Aspirin based products such as Excedrin, Goodys Powder, BC Powder.You may however, continue to take Tylenol if needed for pain up until the day of surgery.  Stop ANY OVER THE COUNTER supplements/vitamins 7 days prior to surgery (Calcium Carb-Cholecalciferol (CALCIUM 600 + D PO) Lactobacillus and GLUCOSAMINE CHONDROITIN TRIPLE)   No Alcohol for 24 hours before or after surgery.  No Smoking including e-cigarettes for 24 hours prior to surgery.  No chewable tobacco products for at least  6 hours prior to surgery.  No nicotine patches on the day of surgery.  Do not use any "recreational" drugs for at least a week prior to your surgery.  Please be advised that the combination of cocaine and anesthesia may have negative outcomes, up to and including death. If you test positive for cocaine, your surgery will be cancelled.  On the morning of surgery brush your teeth with toothpaste and water, you may rinse your mouth with mouthwash if you wish. Do not swallow any toothpaste or mouthwash.  Use CHG Soap as directed on instruction sheet.  Do not wear jewelry, make-up, hairpins, clips or nail polish.  Do not wear lotions, powders, or perfumes.   Do not shave body from the neck down 48 hours prior to surgery just in case you cut yourself which could leave a site for infection.  Also, freshly shaved skin may become irritated if using the CHG soap.  Contact lenses, hearing aids and dentures may not be worn into surgery.  Do not bring valuables to the hospital. [facility] is not responsible for any missing/lost belongings or valuables.   Bring your C-PAP to the hospital with you  Notify your doctor if there is any change in your medical condition (cold, fever, infection).  Wear comfortable clothing (specific to your surgery type) to the hospital.  After surgery, you can help prevent lung complications by doing breathing exercises.  Take deep breaths and cough every 1-2 hours. Your doctor may order a device called an Incentive Spirometer to help you take deep breaths. When coughing or sneezing, hold a pillow firmly against your incision with both hands. This  is called splinting. Doing this helps protect your incision. It also decreases belly discomfort.  If you are being admitted to the hospital overnight, leave your suitcase in the car. After surgery it may be brought to your room.  If you are being discharged the day of surgery, you will not be allowed to drive home. You  will need a responsible adult (18 years or older) to drive you home and stay with you that night.   If you are taking public transportation, you will need to have a responsible adult (18 years or older) with you. Please confirm with your physician that it is acceptable to use public transportation.   Please call the Pre-admissions Testing Dept. at [phone] if you have any questions about these instructions.  Surgery Visitation Policy:  Patients undergoing a surgery or procedure may have one family member or support person with them as long as that person is not COVID-19 positive or experiencing its symptoms.  That person may remain in the waiting area during the procedure and may rotate out with other people.  Inpatient Visitation:    Visiting hours are 7 a.m. to 8 p.m. Up to two visitors ages 16+ are allowed at one time in a patient room. The visitors may rotate out with other people during the day. Visitors must check out when they leave, or other visitors will not be allowed. One designated support person may remain overnight. The visitor must pass COVID-19 screenings, use hand sanitizer when entering and exiting the patients room and wear a mask at all times, including in the patients room. Patients must also wear a mask when staff or their visitor are in the room. Masking is required regardless of vaccination status.

## 2021-01-15 ENCOUNTER — Other Ambulatory Visit: Payer: Self-pay

## 2021-01-15 ENCOUNTER — Encounter: Payer: Self-pay | Admitting: Orthopedic Surgery

## 2021-01-15 ENCOUNTER — Encounter
Admission: RE | Admit: 2021-01-15 | Discharge: 2021-01-15 | Disposition: A | Discharge status: Home / Self Care | Payer: Medicare HMO | Source: Ambulatory Visit | Attending: Orthopedic Surgery | Admitting: Orthopedic Surgery

## 2021-01-15 DIAGNOSIS — Z01812 Encounter for preprocedural laboratory examination: Secondary | ICD-10-CM | POA: Insufficient documentation

## 2021-01-15 DIAGNOSIS — J449 Chronic obstructive pulmonary disease, unspecified: Secondary | ICD-10-CM | POA: Insufficient documentation

## 2021-01-15 DIAGNOSIS — E119 Type 2 diabetes mellitus without complications: Secondary | ICD-10-CM | POA: Diagnosis not present

## 2021-01-15 DIAGNOSIS — I10 Essential (primary) hypertension: Secondary | ICD-10-CM | POA: Diagnosis not present

## 2021-01-15 DIAGNOSIS — Z951 Presence of aortocoronary bypass graft: Secondary | ICD-10-CM | POA: Diagnosis not present

## 2021-01-15 DIAGNOSIS — I25700 Atherosclerosis of coronary artery bypass graft(s), unspecified, with unstable angina pectoris: Secondary | ICD-10-CM | POA: Insufficient documentation

## 2021-01-15 LAB — CBC
HCT: 41.8 % (ref 39.0–52.0)
Hemoglobin: 13.6 g/dL (ref 13.0–17.0)
MCH: 29.3 pg (ref 26.0–34.0)
MCHC: 32.5 g/dL (ref 30.0–36.0)
MCV: 90.1 fL (ref 80.0–100.0)
Platelets: 307 10*3/uL (ref 150–400)
RBC: 4.64 MIL/uL (ref 4.22–5.81)
RDW: 14.6 % (ref 11.5–15.5)
WBC: 6.8 10*3/uL (ref 4.0–10.5)
nRBC: 0 % (ref 0.0–0.2)

## 2021-01-15 LAB — BASIC METABOLIC PANEL
Anion gap: 7 (ref 5–15)
BUN: 20 mg/dL (ref 8–23)
CO2: 25 mmol/L (ref 22–32)
Calcium: 9.2 mg/dL (ref 8.9–10.3)
Chloride: 104 mmol/L (ref 98–111)
Creatinine, Ser: 0.86 mg/dL (ref 0.61–1.24)
GFR, Estimated: >60 mL/min (ref >60)
Glucose, Bld: 100 mg/dL — ABNORMAL HIGH (ref 70–99)
Potassium: 3.9 mmol/L (ref 3.5–5.1)
Sodium: 136 mmol/L (ref 135–145)

## 2021-01-15 NOTE — Progress Notes (Addendum)
Perioperative Services  Pre-Admission/Anesthesia Testing Clinical Review  Date: 01/21/21  Patient Demographics:  Name: Scott TAVELLA Sr. DOB:   01-22-1945 MRN:   [id number]  Planned Surgical Procedure(s):    Case: [id number] Date/Time: 01/22/21 0900   Procedure: RELEASE TRIGGER FINGER/A-1 PULLEY (Right)   Anesthesia type: General   Pre-op diagnosis: Trigger Finger   Location: [facility] OR ROOM 02 / [facility] ORS FOR ANESTHESIA GROUP   Surgeons: Juanell Fairly, MD   NOTE: Available PAT nursing documentation and vital signs have been reviewed. Clinical nursing staff has updated patient's PMH/PSHx, current medication list, and drug allergies/intolerances to ensure comprehensive history available to assist in medical decision making as it pertains to the aforementioned surgical procedure and anticipated anesthetic course. Extensive review of available clinical information performed. [place] PMH and PSHx updated with any diagnoses/procedures that  may have been inadvertently omitted during his intake with the pre-admission testing department's nursing staff.  Clinical Discussion:  Scott BLISSETT Sr. is a 75 y.o. male who is submitted for pre-surgical anesthesia review and clearance prior to him undergoing the above procedure. Patient is a Former Smoker (45 pack years; quit 01/1975). Pertinent PMH includes: CAD (s/p CABG), NSTEMI, post-operative atrial fibrillation, SVT/AVNRT, NSVT, atypical angina, CHF, pulmonary embolism, HTN, HLD, T2DM, asthma, COPD, OSAH (requires nocturnal PAP therapy), GERD (on daily PPI), OA, BPH, tardive dyskinesia, diabetic neuropathy, Parkinsonism, RLS, anxiety.  Patient is followed by cardiology Zoila Shutter, MD). He was last seen in the cardiology clinic on 01/20/2021; notes reviewed.  At the time of his clinic visit, patient doing well overall from a cardiovascular perspective.  He denied any episodes of chest pain, however had chronic exertional dyspnea related to his  underlying COPD diagnosis.  Patient denied any PND, orthopnea, palpitations, significant peripheral edema, vertiginous symptoms, or presyncope/syncope. Patient with a past medical history significant for cardiovascular diagnoses.  Of note, all notes regarding past cardiovascular history unavailable for review at time of consult.  History supplemented by both the patient and current cardiologist's notes.  Patient underwent diagnostic left heart catheterization and 2006 followed by subsequent PCI whereby 5 stents (type and location unknown) were placed.  Patient suffered an NSTEMI on 12/28/2015.  Diagnostic left heart catheterization was performed on 12/29/2015 revealing a mildly reduced left ventricular ejection fraction of 45-50%.  There was mild anterior hypokinesis.  Coronary anatomy demonstrated multivessel CAD; 70% proximal PDA, 60% distal PDA, 90% proximal LAD, 95% D2, 80% D1, 50% proximal LCx, and sequential 90% stenoses in the large OM2.  Intervention was deferred, and patient was referred to CVTS for consideration of CABG procedure.  Patient underwent a four-vessel CABG procedure on 12/31/2015.  LIMA-LAD, SVG-RCA, SVG-LCx, and SVG-diagonal bypass grafts were placed.  Procedure complicated by brief episode of postoperative atrial fibrillation.  Patient was treated with IV metoprolol + amiodarone, which converted him to NSR.  Patient was transitioned to oral therapy prior to discharge.  Patient returned to the hospital on 01/09/2016 with complaints of dizziness, hypotension, poor post sternotomy pain, and feelings of generalized weakness.  He was orthostatic.  Patient found to be in sustained SVT on the monitor with alternating runs of NSVT.  Dysrhythmia felt to be secondary to dehydration, poor medication compliance, and postoperative state.  Most recent TTE was performed on 01/21/2016 revealing normal left ventricular systolic function with mild LVH; LVEF >55%.  Diastolic parameters consistent with  pseudonormalization (G2DD).  There was mild biatrial and right ventricular enlargement. There is no evidence of significant valvular regurgitation or transvalvular gradient  suggestive of stenosis.     Patient on both daily anticoagulation (apixaban) and antiplatelet (clopidogrel); compliant with therapy with no evidence or reports of GI bleeding. Blood pressure is well controlled at 131/86 on currently prescribed ACEi and beta-blocker therapies.  Patient is on a statin for his HLD.  T2DM well-controlled per patient report; last HgbA1c unavailable for review. Patient has an OSAH diagnosis and is compliant with prescribed nocturnal PAP therapy. Functional capacity, as defined by DASI, is documented as being >/= 4 METS.  No changes were made to his medication regimen.  Patient to follow-up with outpatient cardiology in 3 to 4 months or sooner if needed.  Scott Rand Sr. is scheduled for an elective RIGHT RELEASE TRIGGER FINGER/A-1 PULLEY on 01/22/2021 with Dr. Juanell Fairly, MD.  Given patient's past medical history significant for cardiovascular diagnoses, presurgical cardiac clearance was sought by the PAT team.  Per cardiology, "Based on the Revised Cardiac Risk Index, he has a 0.4% 30-day risk of death, MI, or cardiac arrest with the proposed surgery. Based on the ACC/AHA guidelines for preop eval, do not recommend further cardiac testing prior to this surgery provided no intervening change in symptoms or new cardiac events. Further cardiac testing would not improve the patient's perioperative cardiovascular risk and would only serve to delay the surgery".  Again, this patient is on daily anticoagulation and antiplatelet therapies.  He has been instructed on recommendations for holding these medications by his primary attending surgeon.  Patient denies previous perioperative complications with anesthesia in the past.  In review his EMR, there are no records available for review pertaining to past  procedural/anesthetic courses within the [facility] system.  Vitals with BMI 07/27/2020 07/27/2020 07/27/2020  Height - - -  Weight - - -  BMI - - -  Systolic 135 159 979  Diastolic 85 93 95  Pulse 68 65 65    Providers/Specialists:   NOTE: Primary physician provider listed below. Patient may have been seen by APP or partner within same practice.   PROVIDER ROLE / SPECIALTY LAST Rae Halsted, MD Orthopedic Surgeon 12/29/2020  Center, [facility] Primary Care Provider ???  Ileene Hutchinson, MD Cardiology 01/20/2021   Allergies:  Prednisone, Nitroglycerin, Aripiprazole, Simvastatin, and Terazosin  Current Home Medications:   No current facility-administered medications for this encounter.    acetaminophen (TYLENOL) 500 MG tablet   alendronate (FOSAMAX) 70 MG tablet   apixaban (ELIQUIS) 2.5 MG TABS tablet   atorvastatin (LIPITOR) 80 MG tablet   benzonatate (TESSALON) 200 MG capsule   budesonide-formoterol (SYMBICORT) 160-4.5 MCG/ACT inhaler   Calcium Carb-Cholecalciferol (CALCIUM 600 + D PO)   clopidogrel (PLAVIX) 75 MG tablet   cyclobenzaprine (FLEXERIL) 5 MG tablet   finasteride (PROSCAR) 5 MG tablet   FLUoxetine (PROZAC) 20 MG tablet   gabapentin (NEURONTIN) 300 MG capsule   hydrocortisone 2.5 % cream   Lactobacillus TABS   lidocaine (LIDODERM) 5 %   lisinopril (PRINIVIL,ZESTRIL) 2.5 MG tablet   Melatonin 5 MG CAPS   metFORMIN (GLUCOPHAGE) 1000 MG tablet   metoprolol succinate (TOPROL-XL) 25 MG 24 hr tablet   mirtazapine (REMERON) 7.5 MG tablet   Misc Natural Products (GLUCOSAMINE CHONDROITIN TRIPLE) TABS   omeprazole (PRILOSEC) 20 MG capsule   pramipexole (MIRAPEX) 1 MG tablet   senna (SENOKOT) 8.6 MG TABS tablet   sodium chloride (OCEAN) 0.65 % SOLN nasal spray   History:   Past Medical History:  Diagnosis Date   Anxiety  Arthritis    Asthma    Atypical angina (HCC)    BPH (benign prostatic hyperplasia)    CHF (congestive heart failure)  (HCC)    a.) TTE 12/29/2015: EF 45-50%; dilated LA; aortic sclerosis with no stenosis; G2DD. b.) TTE 01/21/2016: EF >55%; mild BAE; mild RV enlargement; G2DD.   COPD (chronic obstructive pulmonary disease) (HCC)    Coronary artery disease 2006   a.) PCI 2006 with stents x 5 (type/location unknown). b.) LHC 12/29/2015: EF 45-50%; mild anterior HK; 70% pPDA, 60% dPDA, 90% pLAD, 95% D2, 80% D1, 50% pLCx, sequential 90% stenoses in large OM2; refer to CVTS. b.) 4v CABG 12/31/2015: LIMA-LAD, SVG-RCA, SVG-LCx, SVG-diagonal   Diabetic neuropathy (HCC)    Dysrhythmia 01/09/2016   a.) admitted 2/2 dizziness, hypotension, poor post-sternotomy pain control. (+) orthostasis. Sustained SVT/AVNRT on monitor alternating with multiple runs of NSVT. Felt to be related to post-operative state, dehydration, and medication compliance.   GERD (gastroesophageal reflux disease)    History of kidney stones    HLD (hyperlipidemia)    Hypertension    Insomnia    a.) takes melatonin   Long term current use of anticoagulant    a.) apixaban   Long term current use of antithrombotics/antiplatelets    a.) clopidogrel   Migraines    Nephrolithiasis    NSTEMI (non-ST elevated myocardial infarction) (HCC) 12/28/2015   a.) LHC 12/29/2015: EF 45-50%; mild anterior HK; 70% pPDA, 60% dPDA, 90% pLAD, 95% D2, 80% D1, 50% pLCx, sequential 90% stenoses in large OM2; refer to CVTS. b.) 4v CABG 12/31/2015: LIMA-LAD, SVG-RCA, SVG-LCx, SVG-diagonal   OSA on CPAP    Parkinsonism (HCC)    PE (pulmonary thromboembolism) (HCC) 2015   Postoperative atrial fibrillation (HCC) 12/31/2015   a.) postop CABG --> brief; resolved with metoprolol and amiodarone; no further recurrences.   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   a.) LIMA-LAD, SVG-RCA, SVG-LCx, SVG-diagonal   T2DM (type 2 diabetes mellitus) (HCC)    Tardive dyskinesia    Past Surgical History:  Procedure Laterality Date   APPENDECTOMY      as a child   CARDIAC CATHETERIZATION Left 12/29/2015   Procedure: CARDIAC CATHETERIZATION; Location: [facility]   CATARACT EXTRACTION Bilateral    COLONOSCOPY     CORONARY ARTERY BYPASS GRAFT N/A 12/31/2015   Procedure: 4v CORONARY ARTERY BYPASS GRAFT; Location: [facility]; Surgeon: Virl Axe, MD   CORONARY STENT PLACEMENT Left 2006   x 5 (type/location unknown)   TOTAL KNEE ARTHROPLASTY Left    WRIST SURGERY     No family history on file. Social History   Tobacco Use   Smoking status: Former    Packs/day: 3.00    Years: 15.00    Pack years: 45.00    Types: Cigarettes    Quit date: 1977    Years since quitting: 46.0   Smokeless tobacco: Never  Vaping Use   Vaping Use: Never used  Substance Use Topics   Alcohol use: No   Drug use: Never    Pertinent Clinical Results:  LABS: Labs reviewed: Acceptable for surgery.  No visits with results within 3 Day(s) from this visit.  Latest known visit with results is:  Hospital Outpatient Visit on 01/15/2021  Component Date Value Ref Range Status   WBC 01/15/2021 6.8  4.0 - 10.5 K/uL Final   RBC 01/15/2021 4.64  4.22 - 5.81 MIL/uL Final   Hemoglobin 01/15/2021 13.6  13.0 -  17.0 g/dL Final   HCT 40/98/1191 41.8  39.0 - 52.0 % Final   MCV 01/15/2021 90.1  80.0 - 100.0 fL Final   MCH 01/15/2021 29.3  26.0 - 34.0 pg Final   MCHC 01/15/2021 32.5  30.0 - 36.0 g/dL Final   RDW 47/82/9562 14.6  11.5 - 15.5 % Final   Platelets 01/15/2021 307  150 - 400 K/uL Final   nRBC 01/15/2021 0.0  0.0 - 0.2 % Final   Performed at [facility], [address] [place], [place] [address]   Sodium 01/15/2021 136  135 - 145 mmol/L Final   Potassium 01/15/2021 3.9  3.5 - 5.1 mmol/L Final   Chloride 01/15/2021 104  98 - 111 mmol/L Final   CO2 01/15/2021 25  22 - 32 mmol/L Final   Glucose, Bld 01/15/2021 100 (H)  70 - 99 mg/dL Final   Glucose reference range applies only to samples taken after fasting for at least 8 hours.   BUN 01/15/2021 20  8 -  23 mg/dL Final   Creatinine, Ser 01/15/2021 0.86  0.61 - 1.24 mg/dL Final   Calcium 57/84/6962 9.2  8.9 - 10.3 mg/dL Final   GFR, Estimated 01/15/2021 >60  >60 mL/min Final   Comment: (NOTE) Calculated using the CKD-EPI Creatinine Equation (2021)    Anion gap 01/15/2021 7  5 - 15 Final   Performed at [facility], [address] [place], [place] [address]    ECG: Date: 07/27/2020 Time ECG obtained: 1059 AM Rate: 64 bpm Rhythm: normal sinus Axis (leads I and aVF): Left axis deviation Intervals: PR 165 ms. QRS 109 ms. QTc 436 ms. ST segment and T wave changes: Minimal ST depression noted in lateral leads  Comparison: Similar to previous tracing obtained on 07/29/2018   IMAGING / PROCEDURES: TRANSTHORACIC ECHOCARDIOGRAM performed on 01/21/2016 Limited study to evaluate ventricular function Normal left ventricular systolic function with EF >55% Mild LVH Diastolic Doppler parameters consistent with pseudonormalization (G2DD) Mild biatrial enlargement Mild right ventricular enlargement Normal RVSF IVC diameter is </= 21 mm with >50% decrease in size with respiration suggesting normal right atrial pressure (0-5 mmHg)  CORONARY ARTERY BYPASS GRAFTING performed on 12/31/2015 Four-vessel CABG procedure LIMA-LAD SVG-RCA SVG-LCx SVG-diagonal  LEFT HEART CATHETERIZATION AND CORONARY ANGIOGRAPHY performed on 12/29/2015 LVEF 45-50% Mild left ventricular systolic dysfunction with mild anterior wall hypokinesis Elevated LVEDP of 27 mmHg Multivessel CAD 70% proximal PDA 60% distal PDA 90% proximal LAD 95% D2 80% D1 50% proximal LCx Sequential 90% stenoses in the large OM2 Refer to CVTS for CABG procedure  Impression and Plan:  Scott Rand Sr. has been referred for pre-anesthesia review and clearance prior to him undergoing the planned anesthetic and procedural courses. Available labs, pertinent testing, and imaging results were personally reviewed by me. This  patient has been appropriately cleared by cardiology with an overall ACCEPTABLE risk of significant perioperative cardiovascular complications.  Based on clinical review performed today (01/21/21), barring any significant acute changes in the patient's overall condition, it is anticipated that he will be able to proceed with the planned surgical intervention. Any acute changes in clinical condition may necessitate his procedure being postponed and/or cancelled. Patient will meet with anesthesia team (MD and/or CRNA) on the day of his procedure for preoperative evaluation/assessment. Questions regarding anesthetic course will be fielded at that time.   Pre-surgical instructions were reviewed with the patient during his PAT appointment and questions were fielded by PAT clinical staff. Patient was advised that  if any questions or concerns arise prior to his procedure then he should return a call to PAT and/or his surgeon's office to discuss.  Quentin Mulling, MSN, APRN, FNP-C, CEN [facility]  Peri-operative Services Nurse Practitioner Phone: [phone] Fax: [phone] 01/21/21 10:19 AM  NOTE: This note has been prepared using Dragon dictation software. Despite my best ability to proofread, there is always the potential that unintentional transcriptional errors may still occur from this process.

## 2021-01-16 ENCOUNTER — Encounter: Payer: Self-pay | Admitting: Orthopedic Surgery

## 2021-01-21 ENCOUNTER — Encounter: Payer: Self-pay | Admitting: Orthopedic Surgery

## 2021-01-22 ENCOUNTER — Ambulatory Visit: Payer: No Typology Code available for payment source | Admitting: Urgent Care

## 2021-01-22 ENCOUNTER — Ambulatory Visit
Admission: RE | Admit: 2021-01-22 | Discharge: 2021-01-22 | Disposition: A | Discharge status: Home / Self Care | Payer: No Typology Code available for payment source | Attending: Orthopedic Surgery | Admitting: Orthopedic Surgery

## 2021-01-22 ENCOUNTER — Other Ambulatory Visit: Payer: Self-pay

## 2021-01-22 ENCOUNTER — Encounter
Admission: RE | Disposition: A | Discharge status: Home / Self Care | Payer: Self-pay | Source: Home / Self Care | Attending: Orthopedic Surgery

## 2021-01-22 ENCOUNTER — Encounter: Payer: Self-pay | Admitting: Orthopedic Surgery

## 2021-01-22 DIAGNOSIS — M65331 Trigger finger, right middle finger: Secondary | ICD-10-CM | POA: Diagnosis present

## 2021-01-22 DIAGNOSIS — Z86711 Personal history of pulmonary embolism: Secondary | ICD-10-CM | POA: Insufficient documentation

## 2021-01-22 DIAGNOSIS — Z955 Presence of coronary angioplasty implant and graft: Secondary | ICD-10-CM | POA: Insufficient documentation

## 2021-01-22 DIAGNOSIS — G4730 Sleep apnea, unspecified: Secondary | ICD-10-CM | POA: Diagnosis not present

## 2021-01-22 DIAGNOSIS — Z87891 Personal history of nicotine dependence: Secondary | ICD-10-CM | POA: Diagnosis not present

## 2021-01-22 DIAGNOSIS — G4733 Obstructive sleep apnea (adult) (pediatric): Secondary | ICD-10-CM | POA: Diagnosis not present

## 2021-01-22 DIAGNOSIS — I110 Hypertensive heart disease with heart failure: Secondary | ICD-10-CM | POA: Insufficient documentation

## 2021-01-22 DIAGNOSIS — F419 Anxiety disorder, unspecified: Secondary | ICD-10-CM | POA: Insufficient documentation

## 2021-01-22 DIAGNOSIS — E1140 Type 2 diabetes mellitus with diabetic neuropathy, unspecified: Secondary | ICD-10-CM | POA: Insufficient documentation

## 2021-01-22 DIAGNOSIS — Z951 Presence of aortocoronary bypass graft: Secondary | ICD-10-CM | POA: Insufficient documentation

## 2021-01-22 DIAGNOSIS — I25118 Atherosclerotic heart disease of native coronary artery with other forms of angina pectoris: Secondary | ICD-10-CM | POA: Insufficient documentation

## 2021-01-22 DIAGNOSIS — K219 Gastro-esophageal reflux disease without esophagitis: Secondary | ICD-10-CM | POA: Insufficient documentation

## 2021-01-22 DIAGNOSIS — M65341 Trigger finger, right ring finger: Secondary | ICD-10-CM | POA: Diagnosis not present

## 2021-01-22 DIAGNOSIS — I509 Heart failure, unspecified: Secondary | ICD-10-CM | POA: Insufficient documentation

## 2021-01-22 DIAGNOSIS — J449 Chronic obstructive pulmonary disease, unspecified: Secondary | ICD-10-CM | POA: Diagnosis not present

## 2021-01-22 DIAGNOSIS — E785 Hyperlipidemia, unspecified: Secondary | ICD-10-CM | POA: Insufficient documentation

## 2021-01-22 DIAGNOSIS — N400 Enlarged prostate without lower urinary tract symptoms: Secondary | ICD-10-CM | POA: Insufficient documentation

## 2021-01-22 DIAGNOSIS — R519 Headache, unspecified: Secondary | ICD-10-CM | POA: Diagnosis not present

## 2021-01-22 DIAGNOSIS — I252 Old myocardial infarction: Secondary | ICD-10-CM | POA: Diagnosis not present

## 2021-01-22 DIAGNOSIS — G2401 Drug induced subacute dyskinesia: Secondary | ICD-10-CM | POA: Insufficient documentation

## 2021-01-22 DIAGNOSIS — G20 Parkinson's disease: Secondary | ICD-10-CM | POA: Insufficient documentation

## 2021-01-22 DIAGNOSIS — G2581 Restless legs syndrome: Secondary | ICD-10-CM | POA: Insufficient documentation

## 2021-01-22 DIAGNOSIS — M1990 Unspecified osteoarthritis, unspecified site: Secondary | ICD-10-CM | POA: Diagnosis not present

## 2021-01-22 DIAGNOSIS — I471 Supraventricular tachycardia: Secondary | ICD-10-CM | POA: Diagnosis not present

## 2021-01-22 HISTORY — PX: TRIGGER FINGER RELEASE: SHX641

## 2021-01-22 LAB — GLUCOSE, CAPILLARY: Glucose-Capillary: 134 mg/dL — ABNORMAL HIGH (ref 70–99)

## 2021-01-22 SURGERY — RELEASE, A1 PULLEY, FOR TRIGGER FINGER
Anesthesia: General | Site: Hand | Laterality: Right

## 2021-01-22 MED ORDER — OXYCODONE HCL 5 MG PO TABS: 5.0000 mg | ORAL_TABLET | Freq: Once | ORAL | Status: AC | PRN

## 2021-01-22 MED ORDER — HYDROCODONE-ACETAMINOPHEN 5-325 MG PO TABS: 1.0000 | ORAL_TABLET | ORAL | 0 refills | Status: AC | PRN

## 2021-01-22 MED ORDER — ONDANSETRON HCL 4 MG/2ML IJ SOLN
INTRAMUSCULAR | Status: AC
  Filled 2021-01-22: qty 2

## 2021-01-22 MED ORDER — IPRATROPIUM-ALBUTEROL 0.5-2.5 (3) MG/3ML IN SOLN
3.0000 mL | Freq: Once | RESPIRATORY_TRACT | Status: AC
  Administered 2021-01-22: 3 mL via RESPIRATORY_TRACT

## 2021-01-22 MED ORDER — LIDOCAINE HCL (PF) 1 % IJ SOLN
INTRAMUSCULAR | Status: AC
  Filled 2021-01-22: qty 30

## 2021-01-22 MED ORDER — SODIUM CHLORIDE 0.9 % IV SOLN: INTRAVENOUS | Status: DC

## 2021-01-22 MED ORDER — FENTANYL CITRATE (PF) 100 MCG/2ML IJ SOLN
INTRAMUSCULAR | Status: AC
  Filled 2021-01-22: qty 2

## 2021-01-22 MED ORDER — FENTANYL CITRATE (PF) 100 MCG/2ML IJ SOLN
25.0000 ug | INTRAMUSCULAR | Status: DC | PRN
  Administered 2021-01-22: 25 ug via INTRAVENOUS
  Administered 2021-01-22: 50 ug via INTRAVENOUS
  Administered 2021-01-22: 25 ug via INTRAVENOUS

## 2021-01-22 MED ORDER — ACETAMINOPHEN 500 MG PO TABS: 1000.0000 mg | ORAL_TABLET | ORAL | Status: AC

## 2021-01-22 MED ORDER — IPRATROPIUM-ALBUTEROL 0.5-2.5 (3) MG/3ML IN SOLN
RESPIRATORY_TRACT | Status: AC
  Filled 2021-01-22: qty 3

## 2021-01-22 MED ORDER — EPHEDRINE 5 MG/ML INJ
INTRAVENOUS | Status: AC
  Filled 2021-01-22: qty 5

## 2021-01-22 MED ORDER — FENTANYL CITRATE (PF) 100 MCG/2ML IJ SOLN
INTRAMUSCULAR | Status: DC | PRN
Start: 2021-01-22 — End: 2021-01-22
  Administered 2021-01-22 (×4): 25 ug via INTRAVENOUS

## 2021-01-22 MED ORDER — OXYCODONE HCL 5 MG PO TABS
ORAL_TABLET | ORAL | Status: AC
  Administered 2021-01-22: 5 mg via ORAL
  Filled 2021-01-22: qty 1

## 2021-01-22 MED ORDER — EPHEDRINE SULFATE 50 MG/ML IJ SOLN
INTRAMUSCULAR | Status: DC | PRN
  Administered 2021-01-22: 10 mg via INTRAVENOUS

## 2021-01-22 MED ORDER — DEXAMETHASONE SODIUM PHOSPHATE 10 MG/ML IJ SOLN
INTRAMUSCULAR | Status: DC | PRN
  Administered 2021-01-22: 5 mg via INTRAVENOUS

## 2021-01-22 MED ORDER — PROPOFOL 10 MG/ML IV BOLUS
INTRAVENOUS | Status: DC | PRN
Start: 2021-01-22 — End: 2021-01-22
  Administered 2021-01-22: 150 mg via INTRAVENOUS

## 2021-01-22 MED ORDER — CHLORHEXIDINE GLUCONATE 0.12 % MT SOLN
OROMUCOSAL | Status: AC
  Administered 2021-01-22: 15 mL via OROMUCOSAL
  Filled 2021-01-22: qty 15

## 2021-01-22 MED ORDER — FENTANYL CITRATE (PF) 100 MCG/2ML IJ SOLN
INTRAMUSCULAR | Status: AC
  Administered 2021-01-22: 25 ug via INTRAVENOUS
  Filled 2021-01-22: qty 2

## 2021-01-22 MED ORDER — OXYCODONE HCL 5 MG/5ML PO SOLN: 5.0000 mg | Freq: Once | ORAL | Status: AC | PRN

## 2021-01-22 MED ORDER — NEOMYCIN-POLYMYXIN B GU 40-200000 IR SOLN
Status: AC
  Filled 2021-01-22: qty 1

## 2021-01-22 MED ORDER — ONDANSETRON HCL 4 MG/2ML IJ SOLN
INTRAMUSCULAR | Status: DC | PRN
  Administered 2021-01-22: 4 mg via INTRAVENOUS

## 2021-01-22 MED ORDER — CHLORHEXIDINE GLUCONATE CLOTH 2 % EX PADS: 6.0000 | MEDICATED_PAD | Freq: Once | CUTANEOUS | Status: DC

## 2021-01-22 MED ORDER — CEFAZOLIN IN SODIUM CHLORIDE 3-0.9 GM/100ML-% IV SOLN
3.0000 g | INTRAVENOUS | Status: AC
  Administered 2021-01-22: 3 g via INTRAVENOUS
  Filled 2021-01-22: qty 100

## 2021-01-22 MED ORDER — CHLORHEXIDINE GLUCONATE 0.12 % MT SOLN: 15.0000 mL | Freq: Once | OROMUCOSAL | Status: AC

## 2021-01-22 MED ORDER — DEXAMETHASONE SODIUM PHOSPHATE 10 MG/ML IJ SOLN
INTRAMUSCULAR | Status: AC
  Filled 2021-01-22: qty 1

## 2021-01-22 MED ORDER — ONDANSETRON HCL 4 MG PO TABS: 4.0000 mg | ORAL_TABLET | Freq: Three times a day (TID) | ORAL | 0 refills | Status: AC | PRN

## 2021-01-22 MED ORDER — NEOMYCIN-POLYMYXIN B GU 40-200000 IR SOLN
Status: AC
  Filled 2021-01-22: qty 2

## 2021-01-22 MED ORDER — ONDANSETRON HCL 4 MG/2ML IJ SOLN: 4.0000 mg | Freq: Once | INTRAMUSCULAR | Status: DC | PRN

## 2021-01-22 MED ORDER — BUPIVACAINE HCL (PF) 0.5 % IJ SOLN
INTRAMUSCULAR | Status: AC
  Filled 2021-01-22: qty 30

## 2021-01-22 MED ORDER — ORAL CARE MOUTH RINSE: 15.0000 mL | Freq: Once | OROMUCOSAL | Status: AC

## 2021-01-22 MED ORDER — LIDOCAINE HCL (CARDIAC) PF 100 MG/5ML IV SOSY
PREFILLED_SYRINGE | INTRAVENOUS | Status: DC | PRN
  Administered 2021-01-22: 100 mg via INTRAVENOUS

## 2021-01-22 MED ORDER — ACETAMINOPHEN 500 MG PO TABS
ORAL_TABLET | ORAL | Status: AC
  Administered 2021-01-22: 1000 mg via ORAL
  Filled 2021-01-22: qty 2

## 2021-01-22 MED ORDER — 0.9 % SODIUM CHLORIDE (POUR BTL) OPTIME
TOPICAL | Status: DC | PRN
Start: 2021-01-22 — End: 2021-01-22
  Administered 2021-01-22: 500 mL

## 2021-01-22 MED ORDER — PROPOFOL 10 MG/ML IV BOLUS
INTRAVENOUS | Status: AC
  Filled 2021-01-22: qty 20

## 2021-01-22 MED ORDER — LIDOCAINE HCL (PF) 2 % IJ SOLN
INTRAMUSCULAR | Status: AC
  Filled 2021-01-22: qty 5

## 2021-01-22 SURGICAL SUPPLY — 41 items
BLADE MINI RND TIP GREEN BEAV (BLADE) ×1 IMPLANT
BNDG GAUZE ELAST 4 BULKY (GAUZE/BANDAGES/DRESSINGS) ×1 IMPLANT
CORD BIP STRL DISP 12FT (MISCELLANEOUS) ×2 IMPLANT
CUFF TOURN SGL QUICK 18X4 (TOURNIQUET CUFF) IMPLANT
DRAPE SURG 17X11 SM STRL (DRAPES) ×2 IMPLANT
DRSG GAUZE FLUFF 36X18 (GAUZE/BANDAGES/DRESSINGS) ×1 IMPLANT
DURAPREP 26ML APPLICATOR (WOUND CARE) ×4 IMPLANT
ELECT REM PT RETURN 9FT ADLT (ELECTROSURGICAL) ×2
ELECTRODE REM PT RTRN 9FT ADLT (ELECTROSURGICAL) ×1 IMPLANT
FORCEPS JEWEL BIP 4-3/4 STR (INSTRUMENTS) ×2 IMPLANT
GAUZE 4X4 16PLY ~~LOC~~+RFID DBL (SPONGE) ×2 IMPLANT
GAUZE SPONGE 4X4 12PLY STRL (GAUZE/BANDAGES/DRESSINGS) ×1 IMPLANT
GAUZE XEROFORM 1X8 LF (GAUZE/BANDAGES/DRESSINGS) ×2 IMPLANT
GLOVE SURG ORTHO LTX SZ9 (GLOVE) ×4 IMPLANT
GLOVE SURG UNDER POLY LF SZ9 (GLOVE) ×2 IMPLANT
GOWN STRL REUS W/ TWL LRG LVL3 (GOWN DISPOSABLE) ×1 IMPLANT
GOWN STRL REUS W/TWL 2XL LVL3 (GOWN DISPOSABLE) ×2 IMPLANT
GOWN STRL REUS W/TWL LRG LVL3 (GOWN DISPOSABLE) ×2
KIT TURNOVER KIT A (KITS) ×2 IMPLANT
MANIFOLD NEPTUNE II (INSTRUMENTS) ×2 IMPLANT
NDL HYPO 25GX1X1/2 BEV (NEEDLE) ×1 IMPLANT
NEEDLE HYPO 25GX1X1/2 BEV (NEEDLE) ×2 IMPLANT
NS IRRIG 500ML POUR BTL (IV SOLUTION) ×2 IMPLANT
PACK EXTREMITY ARMC (MISCELLANEOUS) ×2 IMPLANT
PAD CAST CTTN 4X4 STRL (SOFTGOODS) ×1 IMPLANT
PADDING CAST COTTON 4X4 STRL (SOFTGOODS)
SLING ARM LRG DEEP (SOFTGOODS) ×1 IMPLANT
SPLINT CAST 1 STEP 3X12 (MISCELLANEOUS) ×1 IMPLANT
STOCKINETTE 48X4 2 PLY STRL (GAUZE/BANDAGES/DRESSINGS) ×1 IMPLANT
STOCKINETTE STRL 4IN 9604848 (GAUZE/BANDAGES/DRESSINGS) ×2 IMPLANT
STRIP CLOSURE SKIN 1/4X4 (GAUZE/BANDAGES/DRESSINGS) ×2 IMPLANT
SUT ETHILON 4-0 (SUTURE) ×2
SUT ETHILON 4-0 FS2 18XMFL BLK (SUTURE) ×1
SUT ETHILON 5-0 FS-2 18 BLK (SUTURE) ×2 IMPLANT
SUT MNCRL 4-0 (SUTURE) ×2
SUT MNCRL 4-0 27XMFL (SUTURE) ×1
SUT MNCRL AB 3-0 PS2 18 (SUTURE) ×2 IMPLANT
SUTURE ETHLN 4-0 FS2 18XMF BLK (SUTURE) ×1 IMPLANT
SUTURE MNCRL 4-0 27XMF (SUTURE) ×1 IMPLANT
SYR 10ML LL (SYRINGE) ×2 IMPLANT
WATER STERILE IRR 500ML POUR (IV SOLUTION) ×1 IMPLANT

## 2021-01-22 NOTE — H&P (Signed)
PREOPERATIVE H&P  Chief Complaint: Trigger Finger  HPI: Scott RandJames E Hotard Sr. is a 76 y.o. male who presents for preoperative history and physical with a diagnosis of Trigger Finger. Triggering of the right middle and ring fingers are significantly impairing activities of daily living.  He failed nonoperative management with corticosteroid injections and wished to proceed with open surgical A1 pulley release of the right middle and ring fingers.   Past Medical History:  Diagnosis Date   Anxiety    Arthritis    Asthma    Atypical angina (HCC)    BPH (benign prostatic hyperplasia)    CHF (congestive heart failure) (HCC)    a.) TTE 12/29/2015: EF 45-50%; dilated LA; aortic sclerosis with no stenosis; G2DD. b.) TTE 01/21/2016: EF >55%; mild BAE; mild RV enlargement; G2DD.   COPD (chronic obstructive pulmonary disease) (HCC)    Coronary artery disease 2006   a.) PCI 2006 with stents x 5 (type/location unknown). b.) LHC 12/29/2015: EF 45-50%; mild anterior HK; 70% pPDA, 60% dPDA, 90% pLAD, 95% D2, 80% D1, 50% pLCx, sequential 90% stenoses in large OM2; refer to CVTS. b.) 4v CABG 12/31/2015: LIMA-LAD, SVG-RCA, SVG-LCx, SVG-diagonal   Diabetic neuropathy (HCC)    Dysrhythmia 01/09/2016   a.) admitted 2/2 dizziness, hypotension, poor post-sternotomy pain control. (+) orthostasis. Sustained SVT/AVNRT on monitor alternating with multiple runs of NSVT. Felt to be related to post-operative state, dehydration, and medication compliance.   GERD (gastroesophageal reflux disease)    History of kidney stones    HLD (hyperlipidemia)    Hypertension    Insomnia    a.) takes melatonin   Long term current use of anticoagulant    a.) apixaban   Long term current use of antithrombotics/antiplatelets    a.) clopidogrel   Migraines    Nephrolithiasis    NSTEMI (non-ST elevated myocardial infarction) (HCC) 12/28/2015   a.) LHC 12/29/2015: EF 45-50%; mild anterior HK; 70% pPDA, 60% dPDA, 90% pLAD, 95% D2, 80%  D1, 50% pLCx, sequential 90% stenoses in large OM2; refer to CVTS. b.) 4v CABG 12/31/2015: LIMA-LAD, SVG-RCA, SVG-LCx, SVG-diagonal   OSA on CPAP    Parkinsonism (HCC)    PE (pulmonary thromboembolism) (HCC) 2015   Postoperative atrial fibrillation (HCC) 12/31/2015   a.) postop CABG --> brief; resolved with metoprolol and amiodarone; no further recurrences.   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   a.) LIMA-LAD, SVG-RCA, SVG-LCx, SVG-diagonal   T2DM (type 2 diabetes mellitus) (HCC)    Tardive dyskinesia    Past Surgical History:  Procedure Laterality Date   APPENDECTOMY     as a child   CARDIAC CATHETERIZATION Left 12/29/2015   Procedure: CARDIAC CATHETERIZATION; Location: [facility]   CATARACT EXTRACTION Bilateral    COLONOSCOPY     CORONARY ARTERY BYPASS GRAFT N/A 12/31/2015   Procedure: 4v CORONARY ARTERY BYPASS GRAFT; Location: [facility]; Surgeon: Virl AxeJohn Ikonomidis, MD   CORONARY STENT PLACEMENT Left 2006   x 5 (type/location unknown)   TOTAL KNEE ARTHROPLASTY Left    WRIST SURGERY     Social History   Socioeconomic History   Marital status: Married    Spouse name: Not on file   Number of children: Not on file   Years of education: Not on file   Highest education level: Not on file  Occupational History   Not on file  Tobacco Use   Smoking status: Former    Packs/day: 3.00  Years: 15.00    Pack years: 45.00    Types: Cigarettes    Quit date: 96    Years since quitting: 46.0   Smokeless tobacco: Never  Vaping Use   Vaping Use: Never used  Substance and Sexual Activity   Alcohol use: No   Drug use: Never   Sexual activity: Not on file  Other Topics Concern   Not on file  Social History Narrative   Not on file   Social Determinants of Health   Financial Resource Strain: Not on file  Food Insecurity: Not on file  Transportation Needs: Not on file  Physical Activity: Not on file  Stress: Not on file  Social Connections: Not  on file   No family history on file. Allergies  Allergen Reactions   Prednisone Other (See Comments)    Internal bleeding   Nitroglycerin Other (See Comments)    lowers blood pressure *Only nitro paste*   Aripiprazole Nausea Only   Simvastatin Other (See Comments)    Unknown reaction   Terazosin Other (See Comments)    Unknown reaction   Prior to Admission medications   Medication Sig Start Date End Date Taking? Authorizing Provider  acetaminophen (TYLENOL) 500 MG tablet Take 1,000 mg by mouth 3 (three) times daily as needed for moderate pain.   Yes [provider]  alendronate (FOSAMAX) 70 MG tablet Take 70 mg by mouth every Monday. Take with a full glass of water on an empty stomach.   Yes [provider]  apixaban (ELIQUIS) 2.5 MG TABS tablet Take 2.5 mg by mouth 2 (two) times daily.   Yes [provider]  atorvastatin (LIPITOR) 80 MG tablet Take 80 mg by mouth at bedtime.   Yes [provider]  benzonatate (TESSALON) 200 MG capsule Take 200 mg by mouth 3 (three) times daily as needed for cough.   Yes [provider]  budesonide-formoterol (SYMBICORT) 160-4.5 MCG/ACT inhaler Inhale 2 puffs into the lungs 2 (two) times daily as needed (shortness of breath).   Yes [provider]  Calcium Carb-Cholecalciferol (CALCIUM 600 + D PO) Take 1 tablet by mouth 2 (two) times daily.   Yes [provider]  clopidogrel (PLAVIX) 75 MG tablet Take 75 mg by mouth daily. 02/01/20  Yes [provider]  cyclobenzaprine (FLEXERIL) 5 MG tablet Take 5 mg by mouth 2 (two) times daily as needed for muscle spasms.   Yes [provider]  finasteride (PROSCAR) 5 MG tablet Take 5 mg by mouth every morning.   Yes [provider]  FLUoxetine (PROZAC) 20 MG tablet Take 60 mg by mouth every morning.   Yes [provider]  gabapentin (NEURONTIN) 300 MG capsule Take 300 mg by mouth 3 (three) times daily. Patient not  taking: Reported on 01/09/2021   Yes [provider]  hydrocortisone 2.5 % cream Apply 1 application topically 2 (two) times daily as needed (itching).   Yes [provider]  Lactobacillus TABS Take 2 tablets by mouth daily.   Yes [provider]  lidocaine (LIDODERM) 5 % Place 1 patch onto the skin every 12 (twelve) hours. Remove & Discard patch within 12 hours or as directed by MD Patient taking differently: Place 1 patch onto the skin daily as needed (pain). Remove & Discard patch within 12 hours or as directed by MD 07/27/20 07/27/21 Yes Phineas Semen, MD  lisinopril (PRINIVIL,ZESTRIL) 2.5 MG tablet Take 2.5 mg by mouth every morning. 01/06/16  Yes [provider]  Melatonin 5 MG CAPS Take 10 mg by mouth at bedtime.   Yes [provider]  metFORMIN (GLUCOPHAGE) 1000 MG tablet Take 1,000 mg by mouth daily with breakfast.   Yes [provider]  metoprolol succinate (TOPROL-XL) 25 MG 24 hr tablet Take 12.5 mg by mouth every morning. 01/12/16  Yes [provider]  mirtazapine (REMERON) 7.5 MG tablet Take 7.5 mg by mouth at bedtime.   Yes [provider]  Misc Natural Products (GLUCOSAMINE CHONDROITIN TRIPLE) TABS Take 1 tablet by mouth 2 (two) times daily.   Yes [provider]  omeprazole (PRILOSEC) 20 MG capsule Take 20 mg by mouth every morning.   Yes [provider]  pramipexole (MIRAPEX) 1 MG tablet Take 0.5 mg by mouth every evening.   Yes [provider]  senna (SENOKOT) 8.6 MG TABS tablet Take 2 tablets by mouth 2 (two) times a week. Mondays and Wednesdays   Yes [provider]  sodium chloride (OCEAN) 0.65 % SOLN nasal spray Place 1 spray into both nostrils as needed for congestion.   Yes [provider]     Positive ROS: All other systems have been reviewed and were otherwise negative with the exception of those mentioned in the HPI and as above.  Physical  Exam: General: Alert, no acute distress Cardiovascular: Regular rate and rhythm, no murmurs rubs or gallops.  No pedal edema Respiratory: Clear to auscultation bilaterally, no wheezes rales or rhonchi. No cyanosis, no use of accessory musculature GI: No organomegaly, abdomen is soft and non-tender nondistended with positive bowel sounds. Skin: Skin intact, no lesions within the operative field. Neurologic: Sensation intact distally Psychiatric: Patient is competent for consent with normal mood and affect Lymphatic: No cervical lymphadenopathy  MUSCULOSKELETAL: Right hand: Patient has tenderness over the A1 pulleys.  His skin is intact.  There is no erythema ecchymosis or swelling.  Patient slight flexion deformity of the DIP joints of the right ring and middle fingers.  Patient's fingers well-perfused.  He has intact sensation light touch in all 5 digits.  Assessment: Right middle and ring trigger Fingers  Plan: Plan for Procedure(s): RIGHT RING AND MIDDLE FINGER A-1 PULLEY RELEASES  I reviewed the details of the operation as well as the postoperative course with the patient.  I signed the right ring and middle fingers according hospital's correct site of surgery protocol.  A preop history and physical was performed at the bedside.  I discussed the risks and benefits of surgery. The risks include but are not limited to infection, bleeding, nerve or blood vessel injury, joint stiffness or loss of motion, persistent pain, weakness or instability, persistent or recurrent symptoms and the need for further surgery. Medical risks include but are not limited to DVT and pulmonary embolism, myocardial infarction, stroke, pneumonia, respiratory failure and death. Patient understood these risks and wished to proceed.     Juanell Fairly, MD   01/22/2021 10:07 AM

## 2021-01-22 NOTE — Discharge Instructions (Signed)

## 2021-01-22 NOTE — Op Note (Signed)
01/22/2021  12:03 PM  PATIENT:  Scott South Sr.    PRE-OPERATIVE DIAGNOSIS: Right middle and ring trigger fingers  POST-OPERATIVE DIAGNOSIS:  Same  PROCEDURE:  OPEN RELEASE OF A-1 PULLEYS, RIGHT MIDDLE AND RING FINGER  SURGEON:  Thornton Park, MD  ANESTHESIA:   General  PREOPERATIVE INDICATIONS:  Scott South Sr. is a  76 y.o. male with a diagnosis of right middle and ring trigger Fingers who failed conservative measures and elected for surgical management.    I discussed the risks and benefits of surgery. The risks include but are not limited to infection, bleeding, nerve or blood vessel injury, joint stiffness or loss of motion, persistent pain, weakness or instability, persistent or recurrent trigger finger symptoms and the need for further surgery. Medical risks include but are not limited to DVT and pulmonary embolism, myocardial infarction, stroke, pneumonia, respiratory failure and death. Patient understood these risks and wished to proceed.   OPERATIVE FINDINGS: Thickened A1 pulleys with constriction of flexor tendons in the right middle and ring fingers  OPERATIVE PROCEDURE: Patient was met in the preoperative area.  A preop history and physical was performed.  Patient's right middle and ring fingers were marked over the A1 pulleys according hospital's correct site of surgery protocol.  I answered all the patient's questions.  I reviewed the details of the operation as well as the postoperative course with him.  Patient was brought to the operating room where he was placed supine on the operative table.  He underwent general anesthesia.  His right upper extremity was prepped and draped in sterile fashion.  A timeout was performed to verify the patient's name, date of birth, medical record number, correct site of surgery and correct procedure to be performed.  Once all in attendance were in agreement the case began.  Patient received 2 g of Ancef prior to the onset of the case.   An Esmarch was used to exsanguinate the right upper extremity and the tourniquet was inflated.  The middle finger was addressed first.  A longitudinal incision was made over the A1 pulley.  Subcutaneous tissue was dissected carefully using a hemostat.  Care was taken to avoid injury to the digital neurovascular bundles.  A Heiss retractor was placed which allowed for visualization of the A1 pulley which was released using a micro Beaver blade and tenotomy scissors.  The tendons were examined for possible adhesions.  The wound was copiously irrigated.  A second longitudinal incision was made in line with the ring finger over the A1 pulley.  Again the subcutaneous tissues were carefully dissected using a hemostat.  Ice retractor was again placed to allow for visualization A1 pulley.  The A1 pulley was again debrided using a micro Beaver blade and tenotomy scissors.  Tendons were again examined for adhesions.  The wound was copiously irrigated.  Both incisions were closed using interrupted simple 5-0 nylon.  A dry sterile dressing was applied.  A sling was applied to the right upper extremity.  I scrubbed and present for the entire case and all sharp and instrument counts were correct at the conclusion of the case.  Patient was brought to the PACU in stable condition.

## 2021-01-22 NOTE — Anesthesia Postprocedure Evaluation (Signed)
Anesthesia Post Note  Patient: Scott South Sr.  Procedure(s) Performed: RELEASE TRIGGER FINGER/A-1 PULLEY (Right: Hand)  Patient location during evaluation: PACU Anesthesia Type: General Level of consciousness: awake and alert Pain management: pain level controlled Vital Signs Assessment: post-procedure vital signs reviewed and stable Respiratory status: spontaneous breathing, nonlabored ventilation, respiratory function stable and patient connected to nasal cannula oxygen Cardiovascular status: blood pressure returned to baseline and stable Postop Assessment: no apparent nausea or vomiting Anesthetic complications: no   No notable events documented.   Last Vitals:  Vitals:   01/22/21 1220 01/22/21 1225  BP: (!) 149/97   Pulse: 76 77  Resp: 10 11  Temp:    SpO2: 90% (!) 87%    Last Pain:  Vitals:   01/22/21 1225  TempSrc:   PainSc: 4                  Arita Miss

## 2021-01-22 NOTE — Transfer of Care (Signed)
Immediate Anesthesia Transfer of Care Note  Patient: Scott Rand Sr.  Procedure(s) Performed: RELEASE TRIGGER FINGER/A-1 PULLEY (Right: Hand)  Patient Location: PACU  Anesthesia Type:General  Level of Consciousness: drowsy and patient cooperative  Airway & Oxygen Therapy: Patient Spontanous Breathing and Patient connected to nasal cannula oxygen  Post-op Assessment: Report given to RN and Post -op Vital signs reviewed and stable  Post vital signs: Reviewed and stable  Last Vitals:  Vitals Value Taken Time  BP 160/88 01/22/21 1144  Temp 36 C 01/22/21 1144  Pulse 72 01/22/21 1148  Resp 11 01/22/21 1148  SpO2 97 % 01/22/21 1148  Vitals shown include unvalidated device data.  Last Pain:  Vitals:   01/22/21 0825  TempSrc: Tympanic         Complications: No notable events documented.

## 2021-01-22 NOTE — Anesthesia Preprocedure Evaluation (Addendum)
Anesthesia Evaluation  Patient identified by MRN, date of birth, ID band Patient awake    Reviewed: Allergy & Precautions, NPO status , Patient's Chart, lab work & pertinent test results  History of Anesthesia Complications Negative for: history of anesthetic complications  Airway Mallampati: III  TM Distance: >3 FB Neck ROM: Full    Dental  (+) Edentulous Upper, Partial Lower, Poor Dentition   Pulmonary sleep apnea and Continuous Positive Airway Pressure Ventilation , COPD,  COPD inhaler, Patient abstained from smoking.Not current smoker, former smoker,  Never hospitalized for COPD exacerbation, not on home O2.    + decreased breath sounds      Cardiovascular Exercise Tolerance: Poor METS: 3 - Mets hypertension, Pt. on medications + angina + CAD, + Past MI, + Cardiac Stents, + CABG and +CHF  + dysrhythmias Atrial Fibrillation  Rhythm:Regular Rate:Normal - Systolic murmurs ? Patient underwent diagnostic left heart catheterization and 2006 followed by subsequent PCI whereby 5 stents (type and location unknown) were placed.  ? Patient suffered an NSTEMI on 12/28/2015.  Diagnostic left heart catheterization was performed on 12/29/2015 revealing a mildly reduced left ventricular ejection fraction of 45-50%.  There was mild anterior hypokinesis.  Coronary anatomy demonstrated multivessel CAD; 70% proximal PDA, 60% distal PDA, 90% proximal LAD, 95% D2, 80% D1, 50% proximal LCx, and sequential 90% stenoses in the large OM2.  Intervention was deferred, and patient was referred to CVTS for consideration of CABG procedure.  ? Patient underwent a four-vessel CABG procedure on 12/31/2015.  LIMA-LAD, SVG-RCA, SVG-LCx, and SVG-diagonal bypass grafts were placed.  Procedure complicated by brief episode of postoperative atrial fibrillation.  Patient was treated with IV metoprolol + amiodarone, which converted him to NSR.  Patient was transitioned to  oral therapy prior to discharge.  ? Patient returned to the hospital on 01/09/2016 with complaints of dizziness, hypotension, poor post sternotomy pain, and feelings of generalized weakness.  He was orthostatic.  Patient found to be in sustained SVT on the monitor with alternating runs of NSVT.  Dysrhythmia felt to be secondary to dehydration, poor medication compliance, and postoperative state.  ? Most recent TTE was performed on 01/21/2016 revealing normal left ventricular systolic function with mild LVH; LVEF >55%.  Diastolic parameters consistent with pseudonormalization (G2DD).  There was mild biatrial and right ventricular enlargement. There is no evidence of significant valvular regurgitation or transvalvular gradient suggestive of stenosis.     Neuro/Psych  Headaches, PSYCHIATRIC DISORDERS Anxiety    GI/Hepatic GERD  Medicated,(+)     (-) substance abuse  ,   Endo/Other  diabetes  Renal/GU      Musculoskeletal   Abdominal   Peds  Hematology   Anesthesia Other Findings Past Medical History: No date: Anxiety No date: Arthritis No date: Asthma No date: Atypical angina (HCC) No date: BPH (benign prostatic hyperplasia) No date: CHF (congestive heart failure) (HCC)     Comment:  a.) TTE 12/29/2015: EF 45-50%; dilated LA; aortic               sclerosis with no stenosis; G2DD. b.) TTE 01/21/2016: EF               >55%; mild BAE; mild RV enlargement; G2DD. No date: COPD (chronic obstructive pulmonary disease) (HCC) 2006: Coronary artery disease     Comment:  a.) PCI 2006 with stents x 5 (type/location unknown).               b.) LHC 12/29/2015: EF 45-50%; mild anterior  HK; 70%               pPDA, 60% dPDA, 90% pLAD, 95% D2, 80% D1, 50% pLCx,               sequential 90% stenoses in large OM2; refer to CVTS. b.)               4v CABG 12/31/2015: LIMA-LAD, SVG-RCA, SVG-LCx,               SVG-diagonal No date: Diabetic neuropathy (HCC) 01/09/2016: Dysrhythmia      Comment:  a.) admitted 2/2 dizziness, hypotension, poor               post-sternotomy pain control. (+) orthostasis. Sustained               SVT/AVNRT on monitor alternating with multiple runs of               NSVT. Felt to be related to post-operative state,               dehydration, and medication compliance. No date: GERD (gastroesophageal reflux disease) No date: History of kidney stones No date: HLD (hyperlipidemia) No date: Hypertension No date: Insomnia     Comment:  a.) takes melatonin No date: Long term current use of anticoagulant     Comment:  a.) apixaban No date: Long term current use of antithrombotics/antiplatelets     Comment:  a.) clopidogrel No date: Migraines No date: Nephrolithiasis 12/28/2015: NSTEMI (non-ST elevated myocardial infarction) (HCC)     Comment:  a.) LHC 12/29/2015: EF 45-50%; mild anterior HK; 70%               pPDA, 60% dPDA, 90% pLAD, 95% D2, 80% D1, 50% pLCx,               sequential 90% stenoses in large OM2; refer to CVTS. b.)               4v CABG 12/31/2015: LIMA-LAD, SVG-RCA, SVG-LCx,               SVG-diagonal No date: OSA on CPAP No date: Parkinsonism (HCC) 2015: PE (pulmonary thromboembolism) (HCC) 12/31/2015: Postoperative atrial fibrillation (HCC)     Comment:  a.) postop CABG --> brief; resolved with metoprolol and               amiodarone; no further recurrences. No date: Prurigo nodularis No date: RLS (restless legs syndrome) 12/31/2015: S/P CABG x 4     Comment:  a.) LIMA-LAD, SVG-RCA, SVG-LCx, SVG-diagonal No date: T2DM (type 2 diabetes mellitus) (HCC) No date: Tardive dyskinesia  Reproductive/Obstetrics                            Anesthesia Physical Anesthesia Plan  ASA: 3  Anesthesia Plan: General   Post-op Pain Management: Tylenol PO (pre-op)   Induction: Intravenous  PONV Risk Score and Plan: 2 and Ondansetron and Dexamethasone  Airway Management Planned: LMA  Additional Equipment:  None  Intra-op Plan:   Post-operative Plan: Extubation in OR  Informed Consent: I have reviewed the patients History and Physical, chart, labs and discussed the procedure including the risks, benefits and alternatives for the proposed anesthesia with the patient or authorized representative who has indicated his/her understanding and acceptance.     Dental advisory given  Plan Discussed with: CRNA and Surgeon  Anesthesia Plan Comments: (Discussed risks of anesthesia with patient,  including PONV, sore throat, lip/dental/eye damage. Rare risks discussed as well, such as cardiorespiratory and neurological sequelae, and allergic reactions. Discussed the role of CRNA in patient's perioperative care. Patient understands.  Plan for preoperative duoneb as patient did not take his inhaler this morning. Otherwise patient seems to be as well functionally as he can be; he is able to go up one flight of stairs slowly (able to do it without chest pain or severe SOB ). )        Anesthesia Quick Evaluation

## 2021-01-22 NOTE — Anesthesia Procedure Notes (Signed)
Procedure Name: LMA Insertion Date/Time: 01/22/2021 10:31 AM Performed by: Omer Jack, CRNA Pre-anesthesia Checklist: Patient identified, Patient being monitored, Timeout performed, Emergency Drugs available and Suction available Patient Re-evaluated:Patient Re-evaluated prior to induction Oxygen Delivery Method: Circle system utilized Preoxygenation: Pre-oxygenation with 100% oxygen Induction Type: IV induction Ventilation: Mask ventilation without difficulty LMA: LMA inserted LMA Size: 5.0 Tube type: Oral Number of attempts: 1 Placement Confirmation: positive ETCO2 and breath sounds checked- equal and bilateral Tube secured with: Tape Dental Injury: Teeth and Oropharynx as per pre-operative assessment

## 2021-06-06 DIAGNOSIS — Y92008 Other place in unspecified non-institutional (private) residence as the place of occurrence of the external cause: Secondary | ICD-10-CM | POA: Diagnosis not present

## 2021-06-06 DIAGNOSIS — S3991XA Unspecified injury of abdomen, initial encounter: Secondary | ICD-10-CM | POA: Diagnosis not present

## 2021-06-06 DIAGNOSIS — M47812 Spondylosis without myelopathy or radiculopathy, cervical region: Secondary | ICD-10-CM | POA: Diagnosis not present

## 2021-06-06 DIAGNOSIS — W19XXXA Unspecified fall, initial encounter: Secondary | ICD-10-CM | POA: Diagnosis not present

## 2021-06-06 DIAGNOSIS — S36029A Unspecified contusion of spleen, initial encounter: Secondary | ICD-10-CM | POA: Diagnosis not present

## 2021-06-06 DIAGNOSIS — S20211A Contusion of right front wall of thorax, initial encounter: Secondary | ICD-10-CM | POA: Diagnosis not present

## 2021-06-06 DIAGNOSIS — S3992XA Unspecified injury of lower back, initial encounter: Secondary | ICD-10-CM | POA: Diagnosis not present

## 2021-06-06 DIAGNOSIS — W1830XA Fall on same level, unspecified, initial encounter: Secondary | ICD-10-CM | POA: Diagnosis not present

## 2021-06-06 DIAGNOSIS — R1012 Left upper quadrant pain: Secondary | ICD-10-CM | POA: Diagnosis not present

## 2021-06-06 DIAGNOSIS — R1032 Left lower quadrant pain: Secondary | ICD-10-CM | POA: Diagnosis not present

## 2021-06-06 DIAGNOSIS — S0990XA Unspecified injury of head, initial encounter: Secondary | ICD-10-CM | POA: Diagnosis not present

## 2021-06-06 DIAGNOSIS — Y9389 Activity, other specified: Secondary | ICD-10-CM | POA: Diagnosis not present

## 2021-06-06 DIAGNOSIS — S299XXA Unspecified injury of thorax, initial encounter: Secondary | ICD-10-CM | POA: Diagnosis not present

## 2021-06-06 DIAGNOSIS — R55 Syncope and collapse: Secondary | ICD-10-CM | POA: Diagnosis not present

## 2021-06-06 DIAGNOSIS — R9431 Abnormal electrocardiogram [ECG] [EKG]: Secondary | ICD-10-CM | POA: Diagnosis not present

## 2021-06-06 DIAGNOSIS — S3993XA Unspecified injury of pelvis, initial encounter: Secondary | ICD-10-CM | POA: Diagnosis not present

## 2021-06-06 DIAGNOSIS — J90 Pleural effusion, not elsewhere classified: Secondary | ICD-10-CM | POA: Diagnosis not present

## 2021-06-06 DIAGNOSIS — S01411A Laceration without foreign body of right cheek and temporomandibular area, initial encounter: Secondary | ICD-10-CM | POA: Diagnosis not present

## 2021-06-07 DIAGNOSIS — R55 Syncope and collapse: Secondary | ICD-10-CM | POA: Diagnosis not present

## 2021-06-07 DIAGNOSIS — Y92009 Unspecified place in unspecified non-institutional (private) residence as the place of occurrence of the external cause: Secondary | ICD-10-CM | POA: Diagnosis not present

## 2021-06-07 DIAGNOSIS — W19XXXA Unspecified fall, initial encounter: Secondary | ICD-10-CM | POA: Diagnosis not present

## 2021-06-07 DIAGNOSIS — R488 Other symbolic dysfunctions: Secondary | ICD-10-CM | POA: Diagnosis not present

## 2021-06-08 DIAGNOSIS — W19XXXA Unspecified fall, initial encounter: Secondary | ICD-10-CM | POA: Diagnosis not present

## 2021-06-08 DIAGNOSIS — W109XXA Fall (on) (from) unspecified stairs and steps, initial encounter: Secondary | ICD-10-CM | POA: Diagnosis not present

## 2021-06-08 DIAGNOSIS — Y9301 Activity, walking, marching and hiking: Secondary | ICD-10-CM | POA: Diagnosis not present

## 2021-06-08 DIAGNOSIS — R55 Syncope and collapse: Secondary | ICD-10-CM | POA: Diagnosis not present

## 2021-06-08 DIAGNOSIS — I951 Orthostatic hypotension: Secondary | ICD-10-CM | POA: Diagnosis not present

## 2021-06-08 DIAGNOSIS — K5900 Constipation, unspecified: Secondary | ICD-10-CM | POA: Diagnosis not present

## 2021-06-09 DIAGNOSIS — R42 Dizziness and giddiness: Secondary | ICD-10-CM | POA: Diagnosis not present

## 2021-06-09 DIAGNOSIS — W19XXXA Unspecified fall, initial encounter: Secondary | ICD-10-CM | POA: Diagnosis not present

## 2021-06-09 DIAGNOSIS — R55 Syncope and collapse: Secondary | ICD-10-CM | POA: Diagnosis not present

## 2021-06-10 DIAGNOSIS — R488 Other symbolic dysfunctions: Secondary | ICD-10-CM | POA: Diagnosis not present

## 2021-06-10 DIAGNOSIS — R55 Syncope and collapse: Secondary | ICD-10-CM | POA: Diagnosis not present

## 2021-06-10 DIAGNOSIS — Y92009 Unspecified place in unspecified non-institutional (private) residence as the place of occurrence of the external cause: Secondary | ICD-10-CM | POA: Diagnosis not present

## 2021-06-10 DIAGNOSIS — W19XXXA Unspecified fall, initial encounter: Secondary | ICD-10-CM | POA: Diagnosis not present

## 2021-06-13 DIAGNOSIS — Y92009 Unspecified place in unspecified non-institutional (private) residence as the place of occurrence of the external cause: Secondary | ICD-10-CM | POA: Diagnosis not present

## 2021-06-13 DIAGNOSIS — W19XXXA Unspecified fall, initial encounter: Secondary | ICD-10-CM | POA: Diagnosis not present

## 2021-06-13 DIAGNOSIS — R488 Other symbolic dysfunctions: Secondary | ICD-10-CM | POA: Diagnosis not present

## 2021-06-13 DIAGNOSIS — R55 Syncope and collapse: Secondary | ICD-10-CM | POA: Diagnosis not present

## 2021-06-14 DIAGNOSIS — I2510 Atherosclerotic heart disease of native coronary artery without angina pectoris: Secondary | ICD-10-CM | POA: Diagnosis not present

## 2021-06-14 DIAGNOSIS — D735 Infarction of spleen: Secondary | ICD-10-CM | POA: Diagnosis not present

## 2021-06-14 DIAGNOSIS — J449 Chronic obstructive pulmonary disease, unspecified: Secondary | ICD-10-CM | POA: Diagnosis not present

## 2021-06-14 DIAGNOSIS — I10 Essential (primary) hypertension: Secondary | ICD-10-CM | POA: Diagnosis not present

## 2021-06-14 DIAGNOSIS — R488 Other symbolic dysfunctions: Secondary | ICD-10-CM | POA: Diagnosis not present

## 2021-06-14 DIAGNOSIS — G8911 Acute pain due to trauma: Secondary | ICD-10-CM | POA: Diagnosis not present

## 2021-06-14 DIAGNOSIS — I482 Chronic atrial fibrillation, unspecified: Secondary | ICD-10-CM | POA: Diagnosis not present

## 2021-06-14 DIAGNOSIS — N400 Enlarged prostate without lower urinary tract symptoms: Secondary | ICD-10-CM | POA: Diagnosis not present

## 2021-06-14 DIAGNOSIS — Z86711 Personal history of pulmonary embolism: Secondary | ICD-10-CM | POA: Diagnosis not present

## 2021-06-14 DIAGNOSIS — Y92009 Unspecified place in unspecified non-institutional (private) residence as the place of occurrence of the external cause: Secondary | ICD-10-CM | POA: Diagnosis not present

## 2021-06-14 DIAGNOSIS — W19XXXA Unspecified fall, initial encounter: Secondary | ICD-10-CM | POA: Diagnosis not present

## 2021-06-14 DIAGNOSIS — E118 Type 2 diabetes mellitus with unspecified complications: Secondary | ICD-10-CM | POA: Diagnosis not present

## 2021-06-15 DIAGNOSIS — Y92009 Unspecified place in unspecified non-institutional (private) residence as the place of occurrence of the external cause: Secondary | ICD-10-CM | POA: Diagnosis not present

## 2021-06-15 DIAGNOSIS — R488 Other symbolic dysfunctions: Secondary | ICD-10-CM | POA: Diagnosis not present

## 2021-06-15 DIAGNOSIS — W19XXXA Unspecified fall, initial encounter: Secondary | ICD-10-CM | POA: Diagnosis not present

## 2021-06-16 DIAGNOSIS — R55 Syncope and collapse: Secondary | ICD-10-CM | POA: Diagnosis not present

## 2021-06-19 ENCOUNTER — Other Ambulatory Visit: Payer: Self-pay

## 2021-06-19 ENCOUNTER — Emergency Department: Payer: No Typology Code available for payment source

## 2021-06-19 ENCOUNTER — Emergency Department
Admission: EM | Admit: 2021-06-19 | Discharge: 2021-06-19 | Disposition: A | Discharge status: Home / Self Care | Payer: No Typology Code available for payment source | Attending: Emergency Medicine | Admitting: Emergency Medicine

## 2021-06-19 ENCOUNTER — Encounter: Payer: Self-pay | Admitting: Emergency Medicine

## 2021-06-19 DIAGNOSIS — N132 Hydronephrosis with renal and ureteral calculous obstruction: Secondary | ICD-10-CM | POA: Diagnosis not present

## 2021-06-19 DIAGNOSIS — R319 Hematuria, unspecified: Secondary | ICD-10-CM | POA: Diagnosis present

## 2021-06-19 DIAGNOSIS — N200 Calculus of kidney: Secondary | ICD-10-CM

## 2021-06-19 LAB — BASIC METABOLIC PANEL
Anion gap: 7 (ref 5–15)
BUN: 22 mg/dL (ref 8–23)
CO2: 26 mmol/L (ref 22–32)
Calcium: 9.6 mg/dL (ref 8.9–10.3)
Chloride: 105 mmol/L (ref 98–111)
Creatinine, Ser: 0.78 mg/dL (ref 0.61–1.24)
GFR, Estimated: >60 mL/min (ref >60)
Glucose, Bld: 93 mg/dL (ref 70–99)
Potassium: 4 mmol/L (ref 3.5–5.1)
Sodium: 138 mmol/L (ref 135–145)

## 2021-06-19 LAB — CBC
HCT: 42.4 % (ref 39.0–52.0)
Hemoglobin: 13.3 g/dL (ref 13.0–17.0)
MCH: 28.7 pg (ref 26.0–34.0)
MCHC: 31.4 g/dL (ref 30.0–36.0)
MCV: 91.4 fL (ref 80.0–100.0)
Platelets: 305 10*3/uL (ref 150–400)
RBC: 4.64 MIL/uL (ref 4.22–5.81)
RDW: 13.9 % (ref 11.5–15.5)
WBC: 8.1 10*3/uL (ref 4.0–10.5)
nRBC: 0 % (ref 0.0–0.2)

## 2021-06-19 LAB — URINALYSIS, ROUTINE W REFLEX MICROSCOPIC
Bilirubin Urine: NEGATIVE
Glucose, UA: NEGATIVE mg/dL
Hgb urine dipstick: NEGATIVE
Ketones, ur: NEGATIVE mg/dL
Leukocytes,Ua: NEGATIVE
Nitrite: NEGATIVE
Protein, ur: NEGATIVE mg/dL
Specific Gravity, Urine: 1.023 (ref 1.005–1.030)
pH: 5 (ref 5.0–8.0)

## 2021-06-19 MED ORDER — KETOROLAC TROMETHAMINE 15 MG/ML IJ SOLN
15.0000 mg | Freq: Once | INTRAMUSCULAR | Status: AC
  Administered 2021-06-19: 15 mg via INTRAVENOUS
  Filled 2021-06-19: qty 1

## 2021-06-19 MED ORDER — TAMSULOSIN HCL 0.4 MG PO CAPS: 0.4000 mg | ORAL_CAPSULE | Freq: Every day | ORAL | 0 refills | Status: AC

## 2021-06-19 MED ORDER — NAPROXEN 500 MG PO TABS: 500.0000 mg | ORAL_TABLET | Freq: Two times a day (BID) | ORAL | 2 refills | Status: AC

## 2021-06-19 MED ORDER — ONDANSETRON 4 MG PO TBDP: 4.0000 mg | ORAL_TABLET | Freq: Three times a day (TID) | ORAL | 0 refills | Status: AC | PRN

## 2021-06-19 NOTE — ED Triage Notes (Signed)
Pt from [facility] with reports of headache x 2 days, hematuria and left lower abd pain from fall with bruising. Yesterday pt had a near syncopal episode. IV started and 500NS given.

## 2021-06-19 NOTE — ED Provider Notes (Signed)
[facility] Provider Note    Event Date/Time   First MD Initiated Contact with Patient 06/19/21 1153     (approximate)   History   multiple complaints (Headache, Hematuria, near syncopal)   HPI  Scott CENTER Sr. is a 76 y.o. male who presents for primary complaint of left flank pain.  Patient reports a history of kidney stones and is concerned that he may have another kidney stone.  He denies fevers or chills, reports intermittent left sharp pain over the last 3 days, today he noticed hematuria     Physical Exam   Triage Vital Signs: ED Triage Vitals  Enc Vitals Group     BP 06/19/21 1123 118/81     Pulse Rate 06/19/21 1123 69     Resp 06/19/21 1123 16     Temp 06/19/21 1314 98.3 F (36.8 C)     Temp Source 06/19/21 1314 Oral     SpO2 06/19/21 1123 95 %     Weight --      Height --      Head Circumference --      Peak Flow --      Pain Score 06/19/21 1121 7     Pain Loc --      Pain Edu? --      Excl. in [place]? --     Most recent vital signs: Vitals:   06/19/21 1300 06/19/21 1314  BP: (!) 150/84   Pulse: 64   Resp: 16   Temp:  98.3 F (36.8 C)  SpO2: 95%      General: Awake, no distress.  CV:  Good peripheral perfusion.  Resp:  Normal effort.  Abd:  No distention.  No abdominal tenderness palpation Other:  No CVA tenderness   ED Results / Procedures / Treatments   Labs (all labs ordered are listed, but only abnormal results are displayed) Labs Reviewed  URINALYSIS, ROUTINE W REFLEX MICROSCOPIC - Abnormal; Notable for the following components:      Result Value   Color, Urine YELLOW (*)    APPearance CLEAR (*)    All other components within normal limits  BASIC METABOLIC PANEL  CBC  CBG MONITORING, ED     EKG  ED ECG REPORT I, Lavonia Drafts, the attending physician, personally viewed and interpreted this ECG.  Date: 06/19/2021  Rhythm: normal sinus rhythm QRS Axis: normal Intervals: normal ST/T Wave  abnormalities: normal Narrative Interpretation: no evidence of acute ischemia    RADIOLOGY CT renal stone viewed interpreted by me  demonstrates left-sided UVJ stone    PROCEDURES:  Critical Care performed:   Procedures   MEDICATIONS ORDERED IN ED: Medications  ketorolac (TORADOL) 15 MG/ML injection 15 mg (15 mg Intravenous Given 06/19/21 1308)     IMPRESSION / MDM / ASSESSMENT AND PLAN / ED COURSE  I reviewed the triage vital signs and the nursing notes. Patient's presentation is most consistent with acute illness / injury with system symptoms.  Patient presents with left flank pain as detailed above, noted hematuria, history of kidney stones.  Suspicious for ureterolithiasis, pyelonephritis, colitis diverticulitis  We will treat with IV Toradol, patient does not want any narcotics  Lab work notable for normal BMP, normal CBC, normal urinalysis, CT renal stone study is positive for 5 mm UVJ stone  Patient's pain is resolved, he is appropriate for discharge with analgesics, no narcotic.  Outpatient follow-up recommended, return precautions discussed.        FINAL  CLINICAL IMPRESSION(S) / ED DIAGNOSES   Final diagnoses:  Kidney stone     Rx / DC Orders   ED Discharge Orders          Ordered    naproxen (NAPROSYN) 500 MG tablet  2 times daily with meals        06/19/21 1338    ondansetron (ZOFRAN-ODT) 4 MG disintegrating tablet  Every 8 hours PRN        06/19/21 1338    tamsulosin (FLOMAX) 0.4 MG CAPS capsule  Daily        06/19/21 1338             Note:  This document was prepared using Dragon voice recognition software and may include unintentional dictation errors.   Lavonia Drafts, MD 06/19/21 [phone]

## 2021-06-19 NOTE — ED Notes (Signed)
Pt back from CT. Medication given. See [facility]

## 2021-06-19 NOTE — ED Notes (Signed)
Cbg 93

## 2021-08-07 DIAGNOSIS — W19XXXD Unspecified fall, subsequent encounter: Secondary | ICD-10-CM | POA: Diagnosis not present

## 2021-08-07 DIAGNOSIS — D735 Infarction of spleen: Secondary | ICD-10-CM | POA: Diagnosis not present

## 2021-12-15 DIAGNOSIS — E1122 Type 2 diabetes mellitus with diabetic chronic kidney disease: Secondary | ICD-10-CM | POA: Diagnosis not present

## 2021-12-15 DIAGNOSIS — E1142 Type 2 diabetes mellitus with diabetic polyneuropathy: Secondary | ICD-10-CM | POA: Diagnosis not present

## 2021-12-15 DIAGNOSIS — Z6831 Body mass index (BMI) 31.0-31.9, adult: Secondary | ICD-10-CM | POA: Diagnosis not present

## 2021-12-15 DIAGNOSIS — E1169 Type 2 diabetes mellitus with other specified complication: Secondary | ICD-10-CM | POA: Diagnosis not present

## 2021-12-15 DIAGNOSIS — D6869 Other thrombophilia: Secondary | ICD-10-CM | POA: Diagnosis not present

## 2021-12-15 DIAGNOSIS — F17211 Nicotine dependence, cigarettes, in remission: Secondary | ICD-10-CM | POA: Diagnosis not present

## 2021-12-15 DIAGNOSIS — I25119 Atherosclerotic heart disease of native coronary artery with unspecified angina pectoris: Secondary | ICD-10-CM | POA: Diagnosis not present

## 2021-12-15 DIAGNOSIS — I4891 Unspecified atrial fibrillation: Secondary | ICD-10-CM | POA: Diagnosis not present

## 2021-12-15 DIAGNOSIS — N1831 Chronic kidney disease, stage 3a: Secondary | ICD-10-CM | POA: Diagnosis not present

## 2021-12-15 DIAGNOSIS — Z86010 Personal history of colonic polyps: Secondary | ICD-10-CM | POA: Diagnosis not present

## 2021-12-15 DIAGNOSIS — K219 Gastro-esophageal reflux disease without esophagitis: Secondary | ICD-10-CM | POA: Diagnosis not present

## 2021-12-15 DIAGNOSIS — E1159 Type 2 diabetes mellitus with other circulatory complications: Secondary | ICD-10-CM | POA: Diagnosis not present

## 2021-12-15 DIAGNOSIS — N400 Enlarged prostate without lower urinary tract symptoms: Secondary | ICD-10-CM | POA: Diagnosis not present

## 2021-12-15 DIAGNOSIS — I700 Atherosclerosis of aorta: Secondary | ICD-10-CM | POA: Diagnosis not present

## 2021-12-15 DIAGNOSIS — E669 Obesity, unspecified: Secondary | ICD-10-CM | POA: Diagnosis not present

## 2021-12-15 DIAGNOSIS — E785 Hyperlipidemia, unspecified: Secondary | ICD-10-CM | POA: Diagnosis not present

## 2021-12-15 DIAGNOSIS — Z008 Encounter for other general examination: Secondary | ICD-10-CM | POA: Diagnosis not present

## 2021-12-15 DIAGNOSIS — G4700 Insomnia, unspecified: Secondary | ICD-10-CM | POA: Diagnosis not present

## 2021-12-15 DIAGNOSIS — R2681 Unsteadiness on feet: Secondary | ICD-10-CM | POA: Diagnosis not present

## 2021-12-15 DIAGNOSIS — F330 Major depressive disorder, recurrent, mild: Secondary | ICD-10-CM | POA: Diagnosis not present

## 2021-12-15 DIAGNOSIS — J449 Chronic obstructive pulmonary disease, unspecified: Secondary | ICD-10-CM | POA: Diagnosis not present

## 2021-12-15 DIAGNOSIS — G4733 Obstructive sleep apnea (adult) (pediatric): Secondary | ICD-10-CM | POA: Diagnosis not present

## 2021-12-15 DIAGNOSIS — I129 Hypertensive chronic kidney disease with stage 1 through stage 4 chronic kidney disease, or unspecified chronic kidney disease: Secondary | ICD-10-CM | POA: Diagnosis not present

## 2021-12-15 DIAGNOSIS — M810 Age-related osteoporosis without current pathological fracture: Secondary | ICD-10-CM | POA: Diagnosis not present

## 2021-12-15 DIAGNOSIS — Z7901 Long term (current) use of anticoagulants: Secondary | ICD-10-CM | POA: Diagnosis not present

## 2022-02-05 DIAGNOSIS — M65332 Trigger finger, left middle finger: Secondary | ICD-10-CM | POA: Diagnosis not present

## 2022-02-05 DIAGNOSIS — M65342 Trigger finger, left ring finger: Secondary | ICD-10-CM | POA: Diagnosis not present

## 2022-04-13 DIAGNOSIS — G4700 Insomnia, unspecified: Secondary | ICD-10-CM | POA: Diagnosis not present

## 2022-04-13 DIAGNOSIS — I130 Hypertensive heart and chronic kidney disease with heart failure and stage 1 through stage 4 chronic kidney disease, or unspecified chronic kidney disease: Secondary | ICD-10-CM | POA: Diagnosis not present

## 2022-04-13 DIAGNOSIS — I700 Atherosclerosis of aorta: Secondary | ICD-10-CM | POA: Diagnosis not present

## 2022-04-13 DIAGNOSIS — G4733 Obstructive sleep apnea (adult) (pediatric): Secondary | ICD-10-CM | POA: Diagnosis not present

## 2022-04-13 DIAGNOSIS — I2584 Coronary atherosclerosis due to calcified coronary lesion: Secondary | ICD-10-CM | POA: Diagnosis not present

## 2022-04-13 DIAGNOSIS — R2681 Unsteadiness on feet: Secondary | ICD-10-CM | POA: Diagnosis not present

## 2022-04-13 DIAGNOSIS — E1159 Type 2 diabetes mellitus with other circulatory complications: Secondary | ICD-10-CM | POA: Diagnosis not present

## 2022-04-13 DIAGNOSIS — M810 Age-related osteoporosis without current pathological fracture: Secondary | ICD-10-CM | POA: Diagnosis not present

## 2022-04-13 DIAGNOSIS — F3341 Major depressive disorder, recurrent, in partial remission: Secondary | ICD-10-CM | POA: Diagnosis not present

## 2022-04-13 DIAGNOSIS — E1122 Type 2 diabetes mellitus with diabetic chronic kidney disease: Secondary | ICD-10-CM | POA: Diagnosis not present

## 2022-04-13 DIAGNOSIS — Z86010 Personal history of colonic polyps: Secondary | ICD-10-CM | POA: Diagnosis not present

## 2022-04-13 DIAGNOSIS — E1169 Type 2 diabetes mellitus with other specified complication: Secondary | ICD-10-CM | POA: Diagnosis not present

## 2022-04-13 DIAGNOSIS — D6869 Other thrombophilia: Secondary | ICD-10-CM | POA: Diagnosis not present

## 2022-04-13 DIAGNOSIS — K219 Gastro-esophageal reflux disease without esophagitis: Secondary | ICD-10-CM | POA: Diagnosis not present

## 2022-04-13 DIAGNOSIS — J439 Emphysema, unspecified: Secondary | ICD-10-CM | POA: Diagnosis not present

## 2022-04-13 DIAGNOSIS — Z7901 Long term (current) use of anticoagulants: Secondary | ICD-10-CM | POA: Diagnosis not present

## 2022-04-13 DIAGNOSIS — F17211 Nicotine dependence, cigarettes, in remission: Secondary | ICD-10-CM | POA: Diagnosis not present

## 2022-04-13 DIAGNOSIS — N400 Enlarged prostate without lower urinary tract symptoms: Secondary | ICD-10-CM | POA: Diagnosis not present

## 2022-04-13 DIAGNOSIS — N1831 Chronic kidney disease, stage 3a: Secondary | ICD-10-CM | POA: Diagnosis not present

## 2022-04-13 DIAGNOSIS — Z6830 Body mass index (BMI) 30.0-30.9, adult: Secondary | ICD-10-CM | POA: Diagnosis not present

## 2022-04-13 DIAGNOSIS — I5032 Chronic diastolic (congestive) heart failure: Secondary | ICD-10-CM | POA: Diagnosis not present

## 2022-04-13 DIAGNOSIS — I25119 Atherosclerotic heart disease of native coronary artery with unspecified angina pectoris: Secondary | ICD-10-CM | POA: Diagnosis not present

## 2022-04-13 DIAGNOSIS — E669 Obesity, unspecified: Secondary | ICD-10-CM | POA: Diagnosis not present

## 2022-04-13 DIAGNOSIS — E785 Hyperlipidemia, unspecified: Secondary | ICD-10-CM | POA: Diagnosis not present

## 2022-04-13 DIAGNOSIS — I4891 Unspecified atrial fibrillation: Secondary | ICD-10-CM | POA: Diagnosis not present

## 2022-04-13 DIAGNOSIS — Z008 Encounter for other general examination: Secondary | ICD-10-CM | POA: Diagnosis not present

## 2022-05-04 ENCOUNTER — Ambulatory Visit
Admission: EM | Admit: 2022-05-04 | Discharge: 2022-05-04 | Disposition: A | Discharge status: Home / Self Care | Payer: Non-veteran care | Attending: Emergency Medicine | Admitting: Emergency Medicine

## 2022-05-04 DIAGNOSIS — M545 Low back pain, unspecified: Secondary | ICD-10-CM | POA: Diagnosis not present

## 2022-05-04 MED ORDER — METHOCARBAMOL 500 MG PO TABS: 500.0000 mg | ORAL_TABLET | Freq: Two times a day (BID) | ORAL | 0 refills | Status: AC | PRN

## 2022-05-04 NOTE — ED Triage Notes (Signed)
Patient presents to UC for back pain x 3 weeks. Pulled his back from lifting a 5 gallon of paint. Treating pain with ibuprofen and tylenol together. Last dose 2 hrs total of 1500 mg of tylenol and 600 mg of ibuprofen.

## 2022-05-04 NOTE — Discharge Instructions (Addendum)
Take Tylenol as needed for discomfort.    Take the muscle relaxer as needed for muscle spasm; Do not drive, operate machinery, or drink alcohol with this medication as it can cause drowsiness.   Follow up with your primary care provider or an orthopedist if your symptoms are not improving.     

## 2022-05-04 NOTE — ED Provider Notes (Signed)
Scott Acevedo    CSN: [id number] Arrival date & time: 05/04/22  1316      History   Chief Complaint Chief Complaint  Patient presents with   Back Pain    HPI Scott COTT Sr. is a 77 y.o. male.  Accompanied by his wife, patient presents with bilateral lower back pain x 3 weeks.  The pain started after he lifted a 5 gallon bucket of paint.  It is nonradiating; worse with position changes and ambulation; improves some with Tylenol and ibuprofen.  He denies fever, abdominal pain, flank pain, dysuria, hematuria, numbness, weakness, paresthesias, saddle anesthesia, or other symptoms.  His medical history includes hypertension, heart failure, MI, CABG, diabetes, COPD, pulmonary embolism, parkinsonism.  The history is provided by the patient and medical records.    Past Medical History:  Diagnosis Date   Anxiety    Arthritis    Asthma    Atypical angina    BPH (benign prostatic hyperplasia)    CHF (congestive heart failure)    a.) TTE 12/29/2015: EF 45-50%; dilated LA; aortic sclerosis with no stenosis; G2DD. b.) TTE 01/21/2016: EF >55%; mild BAE; mild RV enlargement; G2DD.   COPD (chronic obstructive pulmonary disease)    Coronary artery disease 2006   a.) PCI 2006 with stents x 5 (type/location unknown). b.) LHC 12/29/2015: EF 45-50%; mild anterior HK; 70% pPDA, 60% dPDA, 90% pLAD, 95% D2, 80% D1, 50% pLCx, sequential 90% stenoses in large OM2; refer to CVTS. b.) 4v CABG 12/31/2015: LIMA-LAD, SVG-RCA, SVG-LCx, SVG-diagonal   Diabetic neuropathy    Dysrhythmia 01/09/2016   a.) admitted 2/2 dizziness, hypotension, poor post-sternotomy pain control. (+) orthostasis. Sustained SVT/AVNRT on monitor alternating with multiple runs of NSVT. Felt to be related to post-operative state, dehydration, and medication compliance.   GERD (gastroesophageal reflux disease)    History of kidney stones    HLD (hyperlipidemia)    Hypertension    Insomnia    a.) takes melatonin   Long term  current use of anticoagulant    a.) apixaban   Long term current use of antithrombotics/antiplatelets    a.) clopidogrel   Migraines    Nephrolithiasis    NSTEMI (non-ST elevated myocardial infarction) 12/28/2015   a.) LHC 12/29/2015: EF 45-50%; mild anterior HK; 70% pPDA, 60% dPDA, 90% pLAD, 95% D2, 80% D1, 50% pLCx, sequential 90% stenoses in large OM2; refer to CVTS. b.) 4v CABG 12/31/2015: LIMA-LAD, SVG-RCA, SVG-LCx, SVG-diagonal   OSA on CPAP    Parkinsonism    PE (pulmonary thromboembolism) 2015   Postoperative atrial fibrillation 12/31/2015   a.) postop CABG --> brief; resolved with metoprolol and amiodarone; no further recurrences.   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   a.) LIMA-LAD, SVG-RCA, SVG-LCx, SVG-diagonal   T2DM (type 2 diabetes mellitus)    Tardive dyskinesia     Patient Active Problem List   Diagnosis Date Noted   Pincer nail deformity 02/28/2020   Pain due to onychomycosis of toenails of both feet 02/28/2020   CHF (congestive heart failure) 10/13/2017   Hypertension 10/13/2017   COPD (chronic obstructive pulmonary disease) 01/30/2016   Vasovagal syncope 01/21/2016   Coronary artery disease involving coronary bypass graft of native heart with unstable angina pectoris 12/31/2015   Actinic keratosis 12/28/2015   Anxiety 12/28/2015   Benign essential hypertension 12/28/2015   Benign prostatic hyperplasia 12/28/2015   Coronary artery disease 12/28/2015   Diabetes mellitus 12/28/2015  History of renal stone 12/28/2015   Hyperlipidemia 12/28/2015   Insomnia 12/28/2015   Long term current use of anticoagulant 12/28/2015   Lumbar radiculopathy 12/28/2015   Neck pain 12/28/2015   Osteoarthritis 12/28/2015   Parkinsonism 12/28/2015   Pulmonary embolism 12/28/2015   Restless legs syndrome 12/28/2015   Seborrheic dermatitis 12/28/2015   Vertigo of central origin 12/28/2015    Past Surgical History:  Procedure  Laterality Date   APPENDECTOMY     as a child   CARDIAC CATHETERIZATION Left 12/29/2015   Procedure: CARDIAC CATHETERIZATION; Location: [facility]   CATARACT EXTRACTION Bilateral    COLONOSCOPY     CORONARY ARTERY BYPASS GRAFT N/A 12/31/2015   Procedure: 4v CORONARY ARTERY BYPASS GRAFT; Location: [facility]; Surgeon: Virl Axe, MD   CORONARY STENT PLACEMENT Left 2006   x 5 (type/location unknown)   TOTAL KNEE ARTHROPLASTY Left    TRIGGER FINGER RELEASE Right 01/22/2021   Procedure: RELEASE TRIGGER FINGER/A-1 PULLEY;  Surgeon: Juanell Fairly, MD;  Location: [facility];  Service: Orthopedics;  Laterality: Right;  Right middle finger, Right ring finger   WRIST SURGERY         Home Medications    Prior to Admission medications   Medication Sig Start Date End Date Taking? Authorizing Provider  methocarbamol (ROBAXIN) 500 MG tablet Take 1 tablet (500 mg total) by mouth 2 (two) times daily as needed for muscle spasms. 05/04/22  Yes Mickie Bail, NP  acetaminophen (TYLENOL) 500 MG tablet Take 1,000 mg by mouth 3 (three) times daily as needed for moderate pain.    [provider]  alendronate (FOSAMAX) 70 MG tablet Take 70 mg by mouth every Monday. Take with a full glass of water on an empty stomach.    [provider]  apixaban (ELIQUIS) 2.5 MG TABS tablet Take 2.5 mg by mouth 2 (two) times daily.    [provider]  atorvastatin (LIPITOR) 80 MG tablet Take 80 mg by mouth at bedtime.    [provider]  benzonatate (TESSALON) 200 MG capsule Take 200 mg by mouth 3 (three) times daily as needed for cough.    [provider]  budesonide-formoterol (SYMBICORT) 160-4.5 MCG/ACT inhaler Inhale 2 puffs into the lungs 2 (two) times daily as needed (shortness of breath).    [provider]  Calcium Carb-Cholecalciferol (CALCIUM 600 + D PO) Take 1 tablet by mouth 2 (two) times daily.    [provider]  clopidogrel (PLAVIX) 75 MG tablet Take 75 mg by  mouth daily. 02/01/20   [provider]  finasteride (PROSCAR) 5 MG tablet Take 5 mg by mouth every morning.    [provider]  FLUoxetine (PROZAC) 20 MG tablet Take 60 mg by mouth every morning.    [provider]  gabapentin (NEURONTIN) 300 MG capsule Take 300 mg by mouth 3 (three) times daily. Patient not taking: Reported on 01/09/2021    [provider]  HYDROcodone-acetaminophen (NORCO) 5-325 MG tablet Take 1 tablet by mouth every 4 (four) hours as needed for moderate pain. 01/22/21   Juanell Fairly, MD  hydrocortisone 2.5 % cream Apply 1 application topically 2 (two) times daily as needed (itching).    [provider]  Lactobacillus TABS Take 2 tablets by mouth daily.    [provider]  lisinopril (PRINIVIL,ZESTRIL) 2.5 MG tablet Take 2.5 mg by mouth every morning. 01/06/16   [provider]  Melatonin 5 MG CAPS Take 10 mg by mouth at bedtime.  [provider]  metFORMIN (GLUCOPHAGE) 1000 MG tablet Take 1,000 mg by mouth daily with breakfast.    [provider]  metoprolol succinate (TOPROL-XL) 25 MG 24 hr tablet Take 12.5 mg by mouth every morning. 01/12/16   [provider]  mirtazapine (REMERON) 7.5 MG tablet Take 7.5 mg by mouth at bedtime.    [provider]  Misc Natural Products (GLUCOSAMINE CHONDROITIN TRIPLE) TABS Take 1 tablet by mouth 2 (two) times daily.    [provider]  naproxen (NAPROSYN) 500 MG tablet Take 1 tablet (500 mg total) by mouth 2 (two) times daily with a meal. 06/19/21   Jene Every, MD  omeprazole (PRILOSEC) 20 MG capsule Take 20 mg by mouth every morning.    [provider]  ondansetron (ZOFRAN) 4 MG tablet Take 1 tablet (4 mg total) by mouth every 8 (eight) hours as needed for nausea or vomiting. 01/22/21   Juanell Fairly, MD  ondansetron (ZOFRAN-ODT) 4 MG disintegrating tablet Take 1 tablet (4 mg total) by mouth every 8 (eight) hours as  needed for nausea or vomiting. 06/19/21   Jene Every, MD  pramipexole (MIRAPEX) 1 MG tablet Take 0.5 mg by mouth every evening.    [provider]  senna (SENOKOT) 8.6 MG TABS tablet Take 2 tablets by mouth 2 (two) times a week. Mondays and Wednesdays    [provider]  sodium chloride (OCEAN) 0.65 % SOLN nasal spray Place 1 spray into both nostrils as needed for congestion.    [provider]  tamsulosin (FLOMAX) 0.4 MG CAPS capsule Take 1 capsule (0.4 mg total) by mouth daily. 06/19/21   Jene Every, MD    Family History History reviewed. No pertinent family history.  Social History Social History   Tobacco Use   Smoking status: Former    Packs/day: 3.00    Years: 15.00    Additional pack years: 0.00    Total pack years: 45.00    Types: Cigarettes    Quit date: 1977    Years since quitting: 47.3   Smokeless tobacco: Never  Vaping Use   Vaping Use: Never used  Substance Use Topics   Alcohol use: No   Drug use: Never     Allergies   Prednisone, Nitroglycerin, Aripiprazole, Simvastatin, and Terazosin   Review of Systems Review of Systems  Constitutional:  Negative for chills and unexpected weight change.  Respiratory:  Negative for cough and shortness of breath.   Cardiovascular:  Negative for chest pain and palpitations.  Gastrointestinal:  Negative for abdominal pain, constipation, diarrhea and vomiting.  Genitourinary:  Negative for dysuria and hematuria.  Musculoskeletal:  Positive for back pain and gait problem.  Skin:  Negative for color change, rash and wound.     Physical Exam Triage Vital Signs ED Triage Vitals [05/04/22 1325]  Enc Vitals Group     BP      Pulse Rate 75     Resp 18     Temp 98.4 F (36.9 C)     Temp src      SpO2 97 %     Weight      Height      Head Circumference      Peak Flow      Pain Score      Pain Loc      Pain Edu?      Excl. in [place]?    No data found.  Updated Vital Signs BP 124/75 (BP  Location: Right Arm)   Pulse 75   Temp 98.4 F (36.9 C)   Resp 18   SpO2 97%   Visual Acuity Right Eye Distance:   Left Eye Distance:   Bilateral Distance:    Right Eye Near:   Left Eye Near:    Bilateral Near:     Physical Exam Vitals and nursing note reviewed.  Constitutional:      General: He is not in acute distress.    Appearance: Normal appearance. He is well-developed. He is not ill-appearing.  HENT:     Mouth/Throat:     Mouth: Mucous membranes are moist.  Cardiovascular:     Rate and Rhythm: Normal rate and regular rhythm.     Heart sounds: Normal heart sounds.  Pulmonary:     Effort: Pulmonary effort is normal. No respiratory distress.     Breath sounds: Normal breath sounds.  Abdominal:     Palpations: Abdomen is soft.     Tenderness: There is no abdominal tenderness. There is no right CVA tenderness, left CVA tenderness, guarding or rebound.  Musculoskeletal:        General: No swelling, tenderness, deformity or signs of injury. Normal range of motion.     Cervical back: Neck supple.  Skin:    General: Skin is warm and dry.     Capillary Refill: Capillary refill takes less than 2 seconds.     Findings: No bruising, erythema, lesion or rash.  Neurological:     General: No focal deficit present.     Mental Status: He is alert and oriented to person, place, and time.     Sensory: No sensory deficit.     Motor: No weakness.     Gait: Gait normal.  Psychiatric:        Mood and Affect: Mood normal.        Behavior: Behavior normal.      UC Treatments / Results  Labs (all labs ordered are listed, but only abnormal results are displayed) Labs Reviewed - No data to display  EKG   Radiology No results found.  Procedures Procedures (including critical care time)  Medications Ordered in UC Medications - No data to display  Initial Impression / Assessment and Plan / UC Course  I have reviewed the triage vital signs and the nursing  notes.  Pertinent labs & imaging results that were available during my care of the patient were reviewed by me and considered in my medical decision making (see chart for details).    Acute low back pain without sciatica.  Patient was recently treated for trigger finger with a steroid taper (04/12/2022).  He is taking maximum doses of ibuprofen and Tylenol for his back pain which provide temporary moderate relief.  Treating today with Robaxin; precautions for drowsiness with this medication discussed.  Discussed risk for falls with this medication; patient accepts this risk due to his ongoing back pain.  Instructed him to follow-up with his PCP or an orthopedist.  Education provided on back pain.  Patient agrees to plan of care.  Final Clinical Impressions(s) / UC Diagnoses   Final diagnoses:  Acute bilateral low back pain without sciatica     Discharge Instructions      Take Tylenol as needed for discomfort.    Take the muscle relaxer as needed for muscle spasm; Do not drive, operate machinery, or drink alcohol with this medication as it can cause drowsiness.   Follow up with your primary care provider  or an orthopedist if your symptoms are not improving.         ED Prescriptions     Medication Sig Dispense Auth. Provider   methocarbamol (ROBAXIN) 500 MG tablet Take 1 tablet (500 mg total) by mouth 2 (two) times daily as needed for muscle spasms. 10 tablet Mickie Bail, NP      I have reviewed the PDMP during this encounter.   Mickie Bail, NP 05/04/22 [phone]

## 2022-09-28 ENCOUNTER — Ambulatory Visit
Admission: EM | Admit: 2022-09-28 | Discharge: 2022-09-28 | Disposition: A | Discharge status: Home / Self Care | Payer: No Typology Code available for payment source | Attending: Emergency Medicine | Admitting: Emergency Medicine

## 2022-09-28 DIAGNOSIS — L03011 Cellulitis of right finger: Secondary | ICD-10-CM | POA: Diagnosis not present

## 2022-09-28 MED ORDER — CEPHALEXIN 500 MG PO CAPS: 500.0000 mg | ORAL_CAPSULE | Freq: Two times a day (BID) | ORAL | 0 refills | Status: AC

## 2022-09-28 NOTE — Discharge Instructions (Signed)
Today you are being treated for a an infection of your right finger nail  Please do not attempt to remove thickened reddened area over the nailbed as this is tissue and it will cause bleeding and pain if you attempt to remove  Take cephalexin every morning and every evening for 7 days  You may take Tylenol every 6 hours as needed for pain  You may continue to soak finger in Epsom salt as needed to help reduce swelling and for pain  You may cleanse at least once a day with soap and water, pat and do not rub, you may cover with a nonstick bandage if at risk for becoming dirty  You may follow-up with this urgent care for any concerns regarding healing

## 2022-09-28 NOTE — ED Provider Notes (Signed)
Scott Acevedo    CSN: [id number] Arrival date & time: 09/28/22  1258      History   Chief Complaint Chief Complaint  Patient presents with   Finger Injury    HPI Scott DOWIS Sr. is a 77 y.o. male.   Patient presents for evaluation of pain, swelling and redness to the right index finger beginning 6 days ago after removal of skin from the corner.  Has become more painful, rating 8 out of 10, described as throbbing.  Has noticed clear to bloody drainage from the site.  Full range of motion.  Has attempted to cleanse, epsom salt soaks, topical antibiotic ointment which has provided no relief.  Denies injury or trauma.  Past Medical History:  Diagnosis Date   Anxiety    Arthritis    Asthma    Atypical angina    BPH (benign prostatic hyperplasia)    CHF (congestive heart failure) (HCC)    a.) TTE 12/29/2015: EF 45-50%; dilated LA; aortic sclerosis with no stenosis; G2DD. b.) TTE 01/21/2016: EF >55%; mild BAE; mild RV enlargement; G2DD.   COPD (chronic obstructive pulmonary disease) (HCC)    Coronary artery disease 2006   a.) PCI 2006 with stents x 5 (type/location unknown). b.) LHC 12/29/2015: EF 45-50%; mild anterior HK; 70% pPDA, 60% dPDA, 90% pLAD, 95% D2, 80% D1, 50% pLCx, sequential 90% stenoses in large OM2; refer to CVTS. b.) 4v CABG 12/31/2015: LIMA-LAD, SVG-RCA, SVG-LCx, SVG-diagonal   Diabetic neuropathy (HCC)    Dysrhythmia 01/09/2016   a.) admitted 2/2 dizziness, hypotension, poor post-sternotomy pain control. (+) orthostasis. Sustained SVT/AVNRT on monitor alternating with multiple runs of NSVT. Felt to be related to post-operative state, dehydration, and medication compliance.   GERD (gastroesophageal reflux disease)    History of kidney stones    HLD (hyperlipidemia)    Hypertension    Insomnia    a.) takes melatonin   Long term current use of anticoagulant    a.) apixaban   Long term current use of antithrombotics/antiplatelets    a.) clopidogrel    Migraines    Nephrolithiasis    NSTEMI (non-ST elevated myocardial infarction) (HCC) 12/28/2015   a.) LHC 12/29/2015: EF 45-50%; mild anterior HK; 70% pPDA, 60% dPDA, 90% pLAD, 95% D2, 80% D1, 50% pLCx, sequential 90% stenoses in large OM2; refer to CVTS. b.) 4v CABG 12/31/2015: LIMA-LAD, SVG-RCA, SVG-LCx, SVG-diagonal   OSA on CPAP    Parkinsonism    PE (pulmonary thromboembolism) (HCC) 2015   Postoperative atrial fibrillation (HCC) 12/31/2015   a.) postop CABG --> brief; resolved with metoprolol and amiodarone; no further recurrences.   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   a.) LIMA-LAD, SVG-RCA, SVG-LCx, SVG-diagonal   T2DM (type 2 diabetes mellitus) (HCC)    Tardive dyskinesia     Patient Active Problem List   Diagnosis Date Noted   Pincer nail deformity 02/28/2020   Pain due to onychomycosis of toenails of both feet 02/28/2020   CHF (congestive heart failure) (HCC) 10/13/2017   Hypertension 10/13/2017   COPD (chronic obstructive pulmonary disease) (HCC) 01/30/2016   Vasovagal syncope 01/21/2016   Coronary artery disease involving coronary bypass graft of native heart with unstable angina pectoris (HCC) 12/31/2015   Actinic keratosis 12/28/2015   Anxiety 12/28/2015   Benign essential hypertension 12/28/2015   Benign prostatic hyperplasia 12/28/2015   Coronary artery disease 12/28/2015   Diabetes mellitus (HCC) 12/28/2015   History  of renal stone 12/28/2015   Hyperlipidemia 12/28/2015   Insomnia 12/28/2015   Long term current use of anticoagulant 12/28/2015   Lumbar radiculopathy 12/28/2015   Neck pain 12/28/2015   Osteoarthritis 12/28/2015   Parkinsonism 12/28/2015   Pulmonary embolism (HCC) 12/28/2015   Restless legs syndrome 12/28/2015   Seborrheic dermatitis 12/28/2015   Vertigo of central origin 12/28/2015    Past Surgical History:  Procedure Laterality Date   APPENDECTOMY     as a child   CARDIAC CATHETERIZATION Left  12/29/2015   Procedure: CARDIAC CATHETERIZATION; Location: [facility]   CATARACT EXTRACTION Bilateral    COLONOSCOPY     CORONARY ARTERY BYPASS GRAFT N/A 12/31/2015   Procedure: 4v CORONARY ARTERY BYPASS GRAFT; Location: [facility]; Surgeon: Virl Axe, MD   CORONARY STENT PLACEMENT Left 2006   x 5 (type/location unknown)   TOTAL KNEE ARTHROPLASTY Left    TRIGGER FINGER RELEASE Right 01/22/2021   Procedure: RELEASE TRIGGER FINGER/A-1 PULLEY;  Surgeon: Juanell Fairly, MD;  Location: [facility];  Service: Orthopedics;  Laterality: Right;  Right middle finger, Right ring finger   WRIST SURGERY         Home Medications    Prior to Admission medications   Medication Sig Start Date End Date Taking? Authorizing Provider  cephALEXin (KEFLEX) 500 MG capsule Take 1 capsule (500 mg total) by mouth 2 (two) times daily for 7 days. 09/28/22 10/05/22 Yes Kacey Dysert, Elita Boone, NP  acetaminophen (TYLENOL) 500 MG tablet Take 1,000 mg by mouth 3 (three) times daily as needed for moderate pain.    [provider]  alendronate (FOSAMAX) 70 MG tablet Take 70 mg by mouth every Monday. Take with a full glass of water on an empty stomach.    [provider]  apixaban (ELIQUIS) 2.5 MG TABS tablet Take 2.5 mg by mouth 2 (two) times daily.    [provider]  atorvastatin (LIPITOR) 80 MG tablet Take 80 mg by mouth at bedtime.    [provider]  benzonatate (TESSALON) 200 MG capsule Take 200 mg by mouth 3 (three) times daily as needed for cough.    [provider]  budesonide-formoterol (SYMBICORT) 160-4.5 MCG/ACT inhaler Inhale 2 puffs into the lungs 2 (two) times daily as needed (shortness of breath).    [provider]  Calcium Carb-Cholecalciferol (CALCIUM 600 + D PO) Take 1 tablet by mouth 2 (two) times daily.    [provider]  clopidogrel (PLAVIX) 75 MG tablet Take 75 mg by mouth daily. 02/01/20   [provider]  finasteride (PROSCAR) 5 MG tablet Take  5 mg by mouth every morning.    [provider]  FLUoxetine (PROZAC) 20 MG tablet Take 60 mg by mouth every morning.    [provider]  gabapentin (NEURONTIN) 300 MG capsule Take 300 mg by mouth 3 (three) times daily. Patient not taking: Reported on 01/09/2021    [provider]  HYDROcodone-acetaminophen (NORCO) 5-325 MG tablet Take 1 tablet by mouth every 4 (four) hours as needed for moderate pain. 01/22/21   Juanell Fairly, MD  hydrocortisone 2.5 % cream Apply 1 application topically 2 (two) times daily as needed (itching).    [provider]  Lactobacillus TABS Take 2 tablets by mouth daily.    [provider]  lisinopril (PRINIVIL,ZESTRIL) 2.5 MG tablet Take 2.5 mg by mouth every morning. 01/06/16   [provider]  Melatonin 5 MG CAPS Take 10 mg by mouth at bedtime.    [provider]  metFORMIN (GLUCOPHAGE) 1000 MG tablet Take 1,000 mg by mouth daily with breakfast.    [provider]  methocarbamol (ROBAXIN) 500 MG tablet Take 1 tablet (500 mg total) by mouth 2 (two) times daily as needed for muscle spasms. 05/04/22   Mickie Bail, NP  metoprolol succinate (TOPROL-XL) 25 MG 24 hr tablet Take 12.5 mg by mouth every morning. 01/12/16   [provider]  mirtazapine (REMERON) 7.5 MG tablet Take 7.5 mg by mouth at bedtime.    [provider]  Misc Natural Products (GLUCOSAMINE CHONDROITIN TRIPLE) TABS Take 1 tablet by mouth 2 (two) times daily.    [provider]  naproxen (NAPROSYN) 500 MG tablet Take 1 tablet (500 mg total) by mouth 2 (two) times daily with a meal. 06/19/21   Jene Every, MD  omeprazole (PRILOSEC) 20 MG capsule Take 20 mg by mouth every morning.    [provider]  ondansetron (ZOFRAN) 4 MG tablet Take 1 tablet (4 mg total) by mouth every 8 (eight) hours as needed for nausea or vomiting. 01/22/21   Juanell Fairly, MD  ondansetron (ZOFRAN-ODT) 4 MG disintegrating tablet  Take 1 tablet (4 mg total) by mouth every 8 (eight) hours as needed for nausea or vomiting. 06/19/21   Jene Every, MD  pramipexole (MIRAPEX) 1 MG tablet Take 0.5 mg by mouth every evening.    [provider]  senna (SENOKOT) 8.6 MG TABS tablet Take 2 tablets by mouth 2 (two) times a week. Mondays and Wednesdays    [provider]  sodium chloride (OCEAN) 0.65 % SOLN nasal spray Place 1 spray into both nostrils as needed for congestion.    [provider]  tamsulosin (FLOMAX) 0.4 MG CAPS capsule Take 1 capsule (0.4 mg total) by mouth daily. 06/19/21   Jene Every, MD    Family History History reviewed. No pertinent family history.  Social History Social History   Tobacco Use   Smoking status: Former    Current packs/day: 0.00    Average packs/day: 3.0 packs/day for 15.0 years (45.0 ttl pk-yrs)    Types: Cigarettes    Start date: 64    Quit date: 1977    Years since quitting: 47.7   Smokeless tobacco: Never  Vaping Use   Vaping status: Never Used  Substance Use Topics   Alcohol use: No   Drug use: Never     Allergies   Prednisone, Nitroglycerin, Aripiprazole, Simvastatin, and Terazosin   Review of Systems Review of Systems   Physical Exam Triage Vital Signs ED Triage Vitals  Encounter Vitals Group     BP 09/28/22 1333 101/64     Systolic BP Percentile --      Diastolic BP Percentile --      Pulse Rate 09/28/22 1333 66     Resp 09/28/22 1333 20     Temp 09/28/22 1333 99 F (37.2 C)     Temp src --      SpO2 09/28/22 1333 96 %     Weight --      Height --      Head Circumference --      Peak Flow --      Pain Score 09/28/22 1331 8     Pain Loc --      Pain Education --      Exclude from Growth Chart --    No data found.  Updated Vital Signs BP 101/64   Pulse 66  Temp 99 F (37.2 C)   Resp 20   SpO2 96%   Visual Acuity Right Eye Distance:   Left Eye Distance:   Bilateral Distance:    Right Eye Near:   Left Eye  Near:    Bilateral Near:     Physical Exam Constitutional:      Appearance: Normal appearance.  Eyes:     Extraocular Movements: Extraocular movements intact.  Pulmonary:     Effort: Pulmonary effort is normal.  Skin:    Comments: Erythema and tenderness along the proximal nailbed of the right index finger, erythematous overgrowth of tissue along the lateral aspect of the nailbed, tender to palpation, no drainage noted on exam, sensation intact, capillary refill less than 3, full range of motion, no involvement of the DIP joint  Neurological:     Mental Status: He is alert and oriented to person, place, and time. Mental status is at baseline.      UC Treatments / Results  Labs (all labs ordered are listed, but only abnormal results are displayed) Labs Reviewed - No data to display  EKG   Radiology No results found.  Procedures Procedures (including critical care time)  Medications Ordered in UC Medications - No data to display  Initial Impression / Assessment and Plan / UC Course  I have reviewed the triage vital signs and the nursing notes.  Pertinent labs & imaging results that were available during my care of the patient were reviewed by me and considered in my medical decision making (see chart for details).  Paronychia of the right index finger  Presentation consistent with infection, discussed with patient, prescribed cephalexin, may continue supportive care through use of Tylenol, warm soaks, elevation and daily cleansing with soap and water, may cover if at risk for contamination within on adherent dressing, given strict precautions to return for any concerns regarding healing Final Clinical Impressions(s) / UC Diagnoses   Final diagnoses:  Paronychia of right index finger     Discharge Instructions      Today you are being treated for a an infection of your right finger nail  Please do not attempt to remove thickened reddened area over the nailbed as  this is tissue and it will cause bleeding and pain if you attempt to remove  Take cephalexin every morning and every evening for 7 days  You may take Tylenol every 6 hours as needed for pain  You may continue to soak finger in Epsom salt as needed to help reduce swelling and for pain  You may cleanse at least once a day with soap and water, pat and do not rub, you may cover with a nonstick bandage if at risk for becoming dirty  You may follow-up with this urgent care for any concerns regarding healing   ED Prescriptions     Medication Sig Dispense Auth. Provider   cephALEXin (KEFLEX) 500 MG capsule Take 1 capsule (500 mg total) by mouth 2 (two) times daily for 7 days. 14 capsule Kamorah Nevils, Elita Boone, NP      PDMP not reviewed this encounter.   Valinda Hoar, NP 09/28/22 1432

## 2022-09-28 NOTE — ED Triage Notes (Signed)
Patient to Urgent Care with complaints of right sided index fingernail pain and swelling. Reports pulling skin from his finger approx 6 days ago.  Has had some some clear and bloody drainage.

## 2022-11-26 ENCOUNTER — Encounter: Payer: Self-pay | Admitting: Emergency Medicine

## 2022-11-26 ENCOUNTER — Emergency Department: Payer: No Typology Code available for payment source

## 2022-11-26 ENCOUNTER — Emergency Department
Admission: EM | Admit: 2022-11-26 | Discharge: 2022-11-26 | Disposition: A | Discharge status: Home / Self Care | Payer: No Typology Code available for payment source | Attending: Emergency Medicine | Admitting: Emergency Medicine

## 2022-11-26 ENCOUNTER — Other Ambulatory Visit: Payer: Self-pay

## 2022-11-26 DIAGNOSIS — R079 Chest pain, unspecified: Secondary | ICD-10-CM | POA: Diagnosis present

## 2022-11-26 DIAGNOSIS — I4891 Unspecified atrial fibrillation: Secondary | ICD-10-CM | POA: Insufficient documentation

## 2022-11-26 DIAGNOSIS — I509 Heart failure, unspecified: Secondary | ICD-10-CM | POA: Insufficient documentation

## 2022-11-26 DIAGNOSIS — I110 Hypertensive heart disease with heart failure: Secondary | ICD-10-CM | POA: Diagnosis not present

## 2022-11-26 DIAGNOSIS — J449 Chronic obstructive pulmonary disease, unspecified: Secondary | ICD-10-CM | POA: Insufficient documentation

## 2022-11-26 DIAGNOSIS — R0789 Other chest pain: Secondary | ICD-10-CM

## 2022-11-26 DIAGNOSIS — I2510 Atherosclerotic heart disease of native coronary artery without angina pectoris: Secondary | ICD-10-CM | POA: Diagnosis not present

## 2022-11-26 LAB — CBC WITH DIFFERENTIAL/PLATELET
Abs Immature Granulocytes: 0.06 10*3/uL (ref 0.00–0.07)
Basophils Absolute: 0 10*3/uL (ref 0.0–0.1)
Basophils Relative: 1 %
Eosinophils Absolute: 0.2 10*3/uL (ref 0.0–0.5)
Eosinophils Relative: 3 %
HCT: 42.6 % (ref 39.0–52.0)
Hemoglobin: 13.9 g/dL (ref 13.0–17.0)
Immature Granulocytes: 1 %
Lymphocytes Relative: 29 %
Lymphs Abs: 1.8 10*3/uL (ref 0.7–4.0)
MCH: 29.6 pg (ref 26.0–34.0)
MCHC: 32.6 g/dL (ref 30.0–36.0)
MCV: 90.6 fL (ref 80.0–100.0)
Monocytes Absolute: 0.6 10*3/uL (ref 0.1–1.0)
Monocytes Relative: 9 %
Neutro Abs: 3.6 10*3/uL (ref 1.7–7.7)
Neutrophils Relative %: 57 %
Platelets: 341 10*3/uL (ref 150–400)
RBC: 4.7 MIL/uL (ref 4.22–5.81)
RDW: 14.6 % (ref 11.5–15.5)
WBC: 6.3 10*3/uL (ref 4.0–10.5)
nRBC: 0 % (ref 0.0–0.2)

## 2022-11-26 LAB — COMPREHENSIVE METABOLIC PANEL
ALT: 24 U/L (ref 0–44)
AST: 29 U/L (ref 15–41)
Albumin: 4.2 g/dL (ref 3.5–5.0)
Alkaline Phosphatase: 75 U/L (ref 38–126)
Anion gap: 13 (ref 5–15)
BUN: 19 mg/dL (ref 8–23)
CO2: 22 mmol/L (ref 22–32)
Calcium: 9.2 mg/dL (ref 8.9–10.3)
Chloride: 100 mmol/L (ref 98–111)
Creatinine, Ser: 0.8 mg/dL (ref 0.61–1.24)
GFR, Estimated: >60 mL/min (ref >60)
Glucose, Bld: 152 mg/dL — ABNORMAL HIGH (ref 70–99)
Potassium: 4 mmol/L (ref 3.5–5.1)
Sodium: 135 mmol/L (ref 135–145)
Total Bilirubin: 0.6 mg/dL (ref <1.2)
Total Protein: 7.8 g/dL (ref 6.5–8.1)

## 2022-11-26 LAB — LIPASE, BLOOD: Lipase: 83 U/L — ABNORMAL HIGH (ref 11–51)

## 2022-11-26 LAB — TROPONIN I (HIGH SENSITIVITY)
Troponin I (High Sensitivity): 7 ng/L (ref <18)
Troponin I (High Sensitivity): 7 ng/L (ref <18)

## 2022-11-26 MED ORDER — IOHEXOL 350 MG/ML SOLN
100.0000 mL | Freq: Once | INTRAVENOUS | Status: AC | PRN
  Administered 2022-11-26: 100 mL via INTRAVENOUS

## 2022-11-26 MED ORDER — FENTANYL CITRATE PF 50 MCG/ML IJ SOSY
50.0000 ug | PREFILLED_SYRINGE | Freq: Once | INTRAMUSCULAR | Status: AC
  Administered 2022-11-26: 50 ug via INTRAVENOUS
  Filled 2022-11-26: qty 1

## 2022-11-26 NOTE — ED Provider Notes (Signed)
Repeat troponin testing shows stability and no elevation.  Patient currently resting, advises he feels well comfortable with plan for discharge.  He does have a cardiologist at the [facility] named Dr. Reginold Agent.  Advised to give his physician a call, in the interim he may also follow-up with our cardiologist here.  Patient understanding agreeable  Careful return precautions discussed with patient and his family is at the bedside.  He will continue his current medications including home aspirin  Return precautions and treatment recommendations and follow-up discussed with the patient who is agreeable with the plan.      Sharyn Creamer, MD 11/26/22 [phone]

## 2022-11-26 NOTE — ED Provider Notes (Signed)
[facility] Provider Note    Event Date/Time   First MD Initiated Contact with Patient 11/26/22 [phone]     (approximate)   History   Chest Pain   HPI  Scott VENKATARAMAN Sr. is a 77 year old male with history of CHF, COPD, CAD, HTN, PE, A-fib presenting to the emergency department for evaluation of chest pain.  Around 4 AM this morning, patient was awakened with chest pain, described as a throbbing pain in the center of his chest. Took a sublingual nitroglycerin with improvement but not resolution of his pain.  Received 324 of aspirin with EMS.  Does additionally report some pain over the right side of his abdomen in both the right upper and lower aspects, reports known history of gallstones, scheduled to have a cholecystectomy at the [place].       Physical Exam   Triage Vital Signs: ED Triage Vitals  Encounter Vitals Group     BP 11/26/22 0449 110/64     Systolic BP Percentile --      Diastolic BP Percentile --      Pulse Rate 11/26/22 0449 71     Resp 11/26/22 0449 14     Temp 11/26/22 0449 97.8 F (36.6 C)     Temp Source 11/26/22 0449 Oral     SpO2 11/26/22 0449 96 %     Weight 11/26/22 0451 240 lb 6.4 oz (109 kg)     Height 11/26/22 0451 6\' 1"  (1.854 m)     Head Circumference --      Peak Flow --      Pain Score 11/26/22 0450 4     Pain Loc --      Pain Education --      Exclude from Growth Chart --     Most recent vital signs: Vitals:   11/26/22 0530 11/26/22 0630  BP: 116/76 (!) 141/76  Pulse: 68 69  Resp: 16 17  Temp:    SpO2: 94% 95%     General: Awake, interactive  CV:  Regular rate, good peripheral perfusion.  Resp:  Unlabored respirations, lungs clear to auscultation Abd:  Soft, tenderness to palpation over the right upper and lower quadrant with negative Murphy sign Neuro:  Symmetric facial movement, fluid speech   ED Results / Procedures / Treatments   Labs (all labs ordered are listed, but only abnormal results are  displayed) Labs Reviewed  COMPREHENSIVE METABOLIC PANEL - Abnormal; Notable for the following components:      Result Value   Glucose, Bld 152 (*)    All other components within normal limits  LIPASE, BLOOD - Abnormal; Notable for the following components:   Lipase 83 (*)    All other components within normal limits  CBC WITH DIFFERENTIAL/PLATELET  TROPONIN I (HIGH SENSITIVITY)  TROPONIN I (HIGH SENSITIVITY)     EKG EKG independently reviewed interpreted by myself (ER attending) demonstrates:  EKG demonstrates sinus rhythm at a rate of 72, PR 165, QRS 109, QTc 455, no acute ST changes  RADIOLOGY Imaging independently reviewed and interpreted by myself demonstrates:  CXR without focal consolidation. CTA without evidence of PE, radiology notes findings concerning for possible pulmonary hypertension CT abdomen pelvis with chronic cholelithiasis, no findings suggestive of acute cholecystitis or other acute process  PROCEDURES:  Critical Care performed: No  Procedures   MEDICATIONS ORDERED IN ED: Medications  fentaNYL (SUBLIMAZE) injection 50 mcg (50 mcg Intravenous Given 11/26/22 0511)  iohexol (OMNIPAQUE) 350 MG/ML injection 100  mL (100 mLs Intravenous Contrast Given 11/26/22 0604)     IMPRESSION / MDM / ASSESSMENT AND PLAN / ED COURSE  I reviewed the triage vital signs and the nursing notes.  Differential diagnosis includes, but is not limited to, ACS, PE, pneumonia, pneumothorax, cholecystitis, other acute intra-abdominal process  Patient's presentation is most consistent with acute presentation with potential threat to life or bodily function.  77 year old male presenting to the emergency department for evaluation of chest pain and abdominal pain.  Vital stable on presentation.  Without acute ischemic findings.  With history of PE and abdominal pain, CT of the chest and CT abdomen pelvis were ordered which fortunately did not demonstrate any emergent findings.  His initial  blood work is reassuring including initial negative troponin.  Lipase is slightly elevated but under 3 times the limit of upper normal, not reflective of acute pancreatitis.  With pain onset shortly prior to presentation, will obtain repeat troponin.  Did discuss that with patient's history, he does have a moderate risk heart score and did discuss possibly of admission.  Patient reports that his care is established at the [place] and would prefer to follow-up closely as an outpatient there which I do think is reasonable if his repeat troponin is negative.  Signed out to oncoming physician pending repeat troponin and disposition.     FINAL CLINICAL IMPRESSION(S) / ED DIAGNOSES   Final diagnoses:  Atypical chest pain     Rx / DC Orders   ED Discharge Orders     None        Note:  This document was prepared using Dragon voice recognition software and may include unintentional dictation errors.   Trinna Post, MD 11/26/22 [phone]

## 2022-11-26 NOTE — ED Triage Notes (Signed)
Substernal chest pain started at 0430am history of 7 stents.  Patient took one 0.4mg  sublingual nitroglycerin,  pain a 8/10 to 4/10.  Ems gave 324mg  of aspirin,

## 2023-03-25 IMAGING — CR DG CHEST 2V
1 series · 2 of 2 positions shown · non-contrast
Comparison: Chest x-ray dated 02/19/2017.

CLINICAL DATA: RIGHT chest pain after fall.

EXAM:
CHEST - 2 VIEW

[Series 1: x chest ap · 0.14mm/px · 2 of 2 slices shown]
[im 1/2]
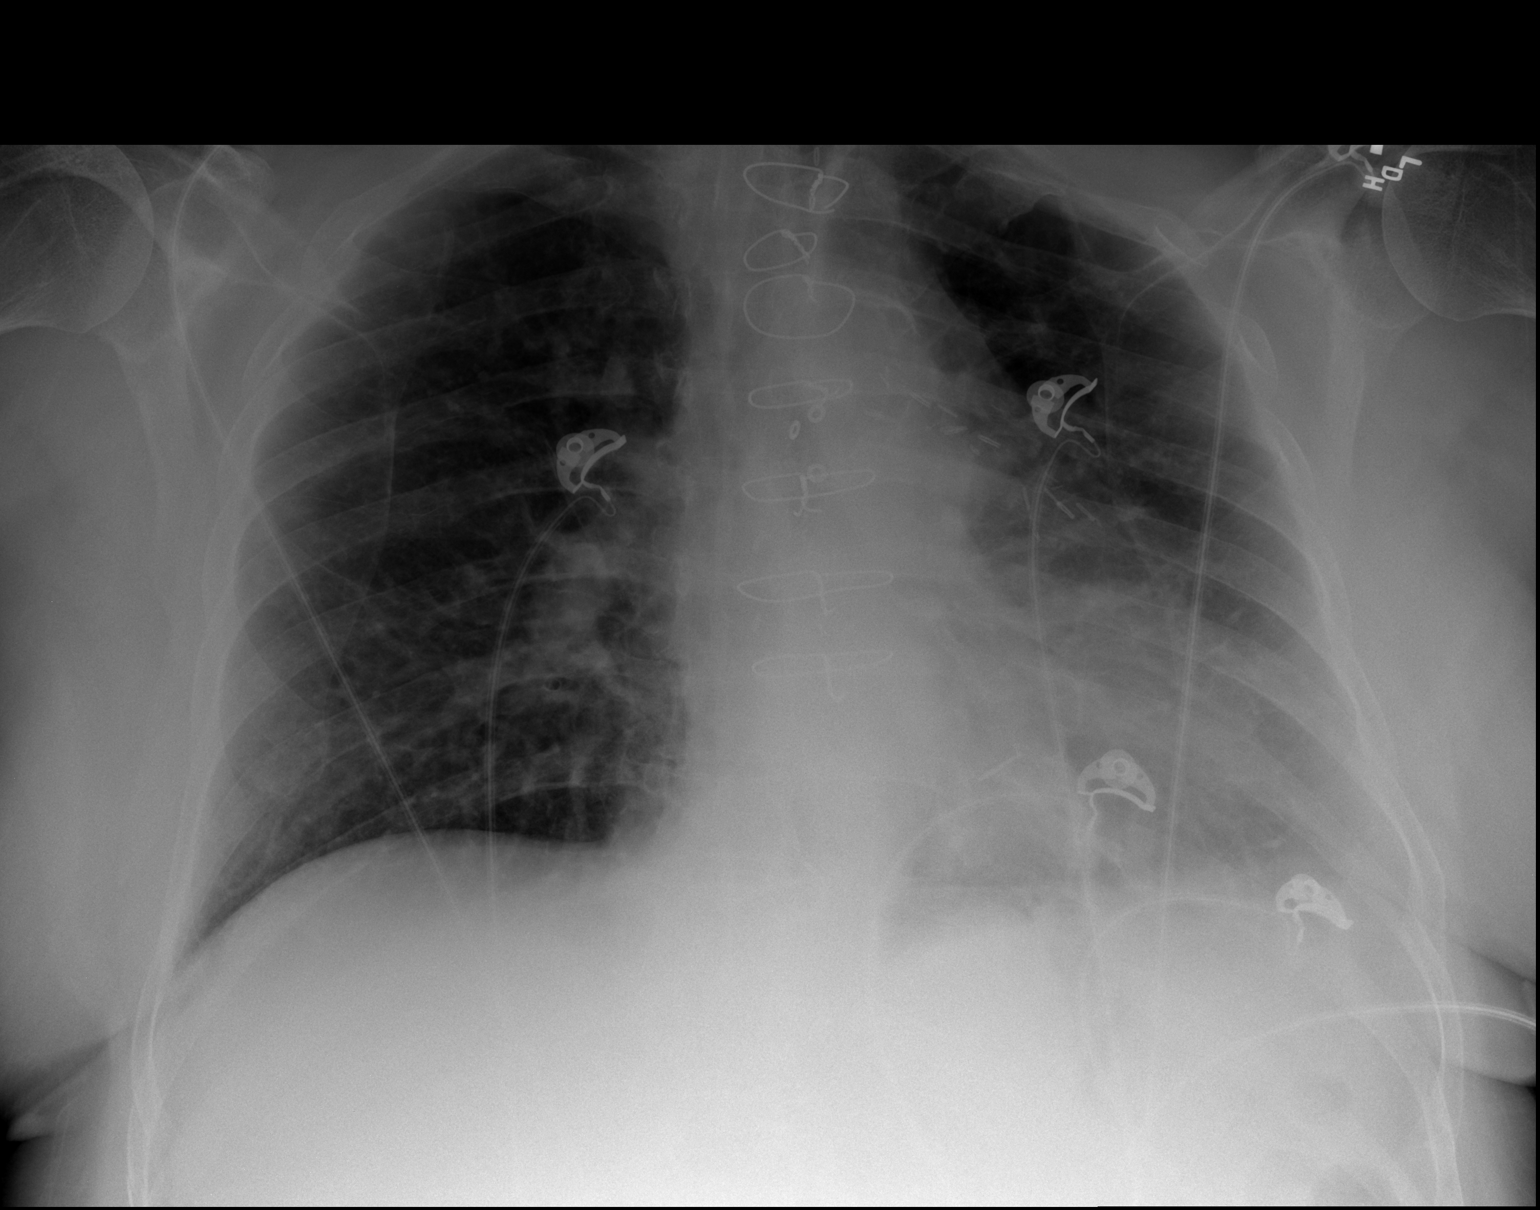
[im 2/2]
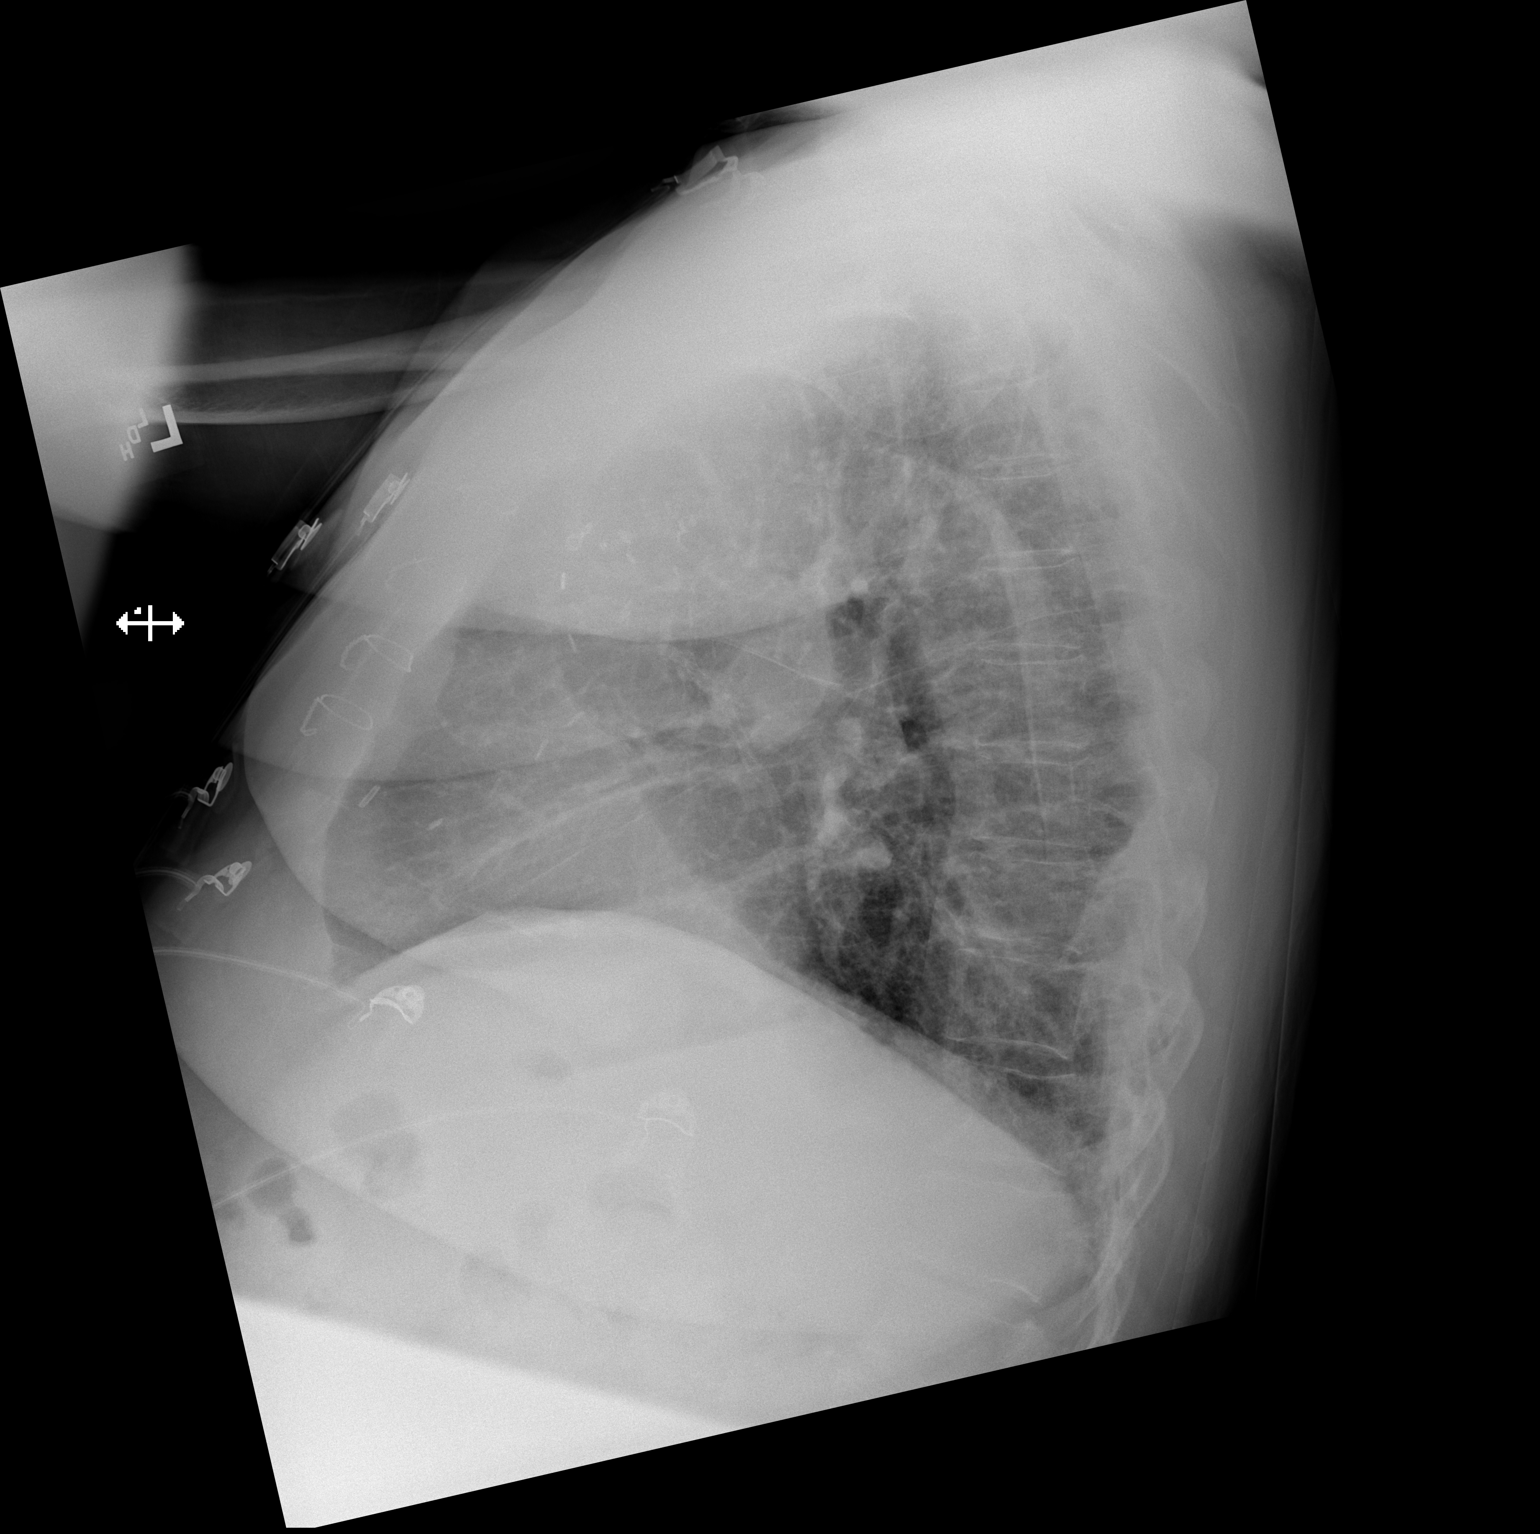

[2 of 2 positions shown; findings below may reference images not displayed]

FINDINGS: Stable cardiomegaly. Median sternotomy wires appear intact and
stable in alignment. No evidence of pneumonia, pulmonary edema,
pleural effusion or pneumothorax. No acute-appearing osseous
abnormality.
IMPRESSION: No acute findings. No evidence of pneumonia or pulmonary edema.
Stable cardiomegaly.

## 2023-07-20 DIAGNOSIS — Z6831 Body mass index (BMI) 31.0-31.9, adult: Secondary | ICD-10-CM | POA: Diagnosis not present

## 2023-07-20 DIAGNOSIS — E1169 Type 2 diabetes mellitus with other specified complication: Secondary | ICD-10-CM | POA: Diagnosis not present

## 2023-07-20 DIAGNOSIS — I2782 Chronic pulmonary embolism: Secondary | ICD-10-CM | POA: Diagnosis not present

## 2023-07-20 DIAGNOSIS — E669 Obesity, unspecified: Secondary | ICD-10-CM | POA: Diagnosis not present

## 2023-07-20 DIAGNOSIS — M810 Age-related osteoporosis without current pathological fracture: Secondary | ICD-10-CM | POA: Diagnosis not present

## 2023-07-20 DIAGNOSIS — I480 Paroxysmal atrial fibrillation: Secondary | ICD-10-CM | POA: Diagnosis not present

## 2023-07-20 DIAGNOSIS — I5032 Chronic diastolic (congestive) heart failure: Secondary | ICD-10-CM | POA: Diagnosis not present

## 2023-07-20 DIAGNOSIS — Z951 Presence of aortocoronary bypass graft: Secondary | ICD-10-CM | POA: Diagnosis not present

## 2023-07-20 DIAGNOSIS — G4733 Obstructive sleep apnea (adult) (pediatric): Secondary | ICD-10-CM | POA: Diagnosis not present

## 2023-07-20 DIAGNOSIS — I25119 Atherosclerotic heart disease of native coronary artery with unspecified angina pectoris: Secondary | ICD-10-CM | POA: Diagnosis not present

## 2023-07-20 DIAGNOSIS — E1122 Type 2 diabetes mellitus with diabetic chronic kidney disease: Secondary | ICD-10-CM | POA: Diagnosis not present

## 2023-07-20 DIAGNOSIS — K219 Gastro-esophageal reflux disease without esophagitis: Secondary | ICD-10-CM | POA: Diagnosis not present

## 2023-07-20 DIAGNOSIS — N182 Chronic kidney disease, stage 2 (mild): Secondary | ICD-10-CM | POA: Diagnosis not present

## 2023-07-20 DIAGNOSIS — F32A Depression, unspecified: Secondary | ICD-10-CM | POA: Diagnosis not present

## 2023-07-20 DIAGNOSIS — I2510 Atherosclerotic heart disease of native coronary artery without angina pectoris: Secondary | ICD-10-CM | POA: Diagnosis not present

## 2023-07-20 DIAGNOSIS — R2681 Unsteadiness on feet: Secondary | ICD-10-CM | POA: Diagnosis not present

## 2023-07-20 DIAGNOSIS — I130 Hypertensive heart and chronic kidney disease with heart failure and stage 1 through stage 4 chronic kidney disease, or unspecified chronic kidney disease: Secondary | ICD-10-CM | POA: Diagnosis not present

## 2023-07-20 DIAGNOSIS — I495 Sick sinus syndrome: Secondary | ICD-10-CM | POA: Diagnosis not present

## 2023-07-20 DIAGNOSIS — N400 Enlarged prostate without lower urinary tract symptoms: Secondary | ICD-10-CM | POA: Diagnosis not present

## 2023-07-20 DIAGNOSIS — R351 Nocturia: Secondary | ICD-10-CM | POA: Diagnosis not present

## 2023-07-20 DIAGNOSIS — E1159 Type 2 diabetes mellitus with other circulatory complications: Secondary | ICD-10-CM | POA: Diagnosis not present

## 2023-07-20 DIAGNOSIS — M545 Low back pain, unspecified: Secondary | ICD-10-CM | POA: Diagnosis not present

## 2023-07-20 DIAGNOSIS — G4700 Insomnia, unspecified: Secondary | ICD-10-CM | POA: Diagnosis not present

## 2023-07-20 DIAGNOSIS — G43909 Migraine, unspecified, not intractable, without status migrainosus: Secondary | ICD-10-CM | POA: Diagnosis not present

## 2023-07-20 DIAGNOSIS — N401 Enlarged prostate with lower urinary tract symptoms: Secondary | ICD-10-CM | POA: Diagnosis not present

## 2023-07-20 DIAGNOSIS — J449 Chronic obstructive pulmonary disease, unspecified: Secondary | ICD-10-CM | POA: Diagnosis not present

## 2023-07-20 DIAGNOSIS — Z008 Encounter for other general examination: Secondary | ICD-10-CM | POA: Diagnosis not present

## 2023-07-20 DIAGNOSIS — Z86711 Personal history of pulmonary embolism: Secondary | ICD-10-CM | POA: Diagnosis not present

## 2023-07-20 DIAGNOSIS — F321 Major depressive disorder, single episode, moderate: Secondary | ICD-10-CM | POA: Diagnosis not present

## 2023-07-20 DIAGNOSIS — J441 Chronic obstructive pulmonary disease with (acute) exacerbation: Secondary | ICD-10-CM | POA: Diagnosis not present

## 2023-07-20 DIAGNOSIS — E785 Hyperlipidemia, unspecified: Secondary | ICD-10-CM | POA: Diagnosis not present

## 2023-07-20 DIAGNOSIS — R350 Frequency of micturition: Secondary | ICD-10-CM | POA: Diagnosis not present

## 2023-11-03 ENCOUNTER — Emergency Department
Admission: EM | Admit: 2023-11-03 | Discharge: 2023-11-03 | Disposition: A | Discharge status: Home / Self Care | Attending: Emergency Medicine | Admitting: Emergency Medicine

## 2023-11-03 DIAGNOSIS — I4891 Unspecified atrial fibrillation: Secondary | ICD-10-CM | POA: Insufficient documentation

## 2023-11-03 LAB — CBC WITH DIFFERENTIAL/PLATELET
Abs Immature Granulocytes: 0.05 K/uL (ref 0.00–0.07)
Basophils Absolute: 0 K/uL (ref 0.0–0.1)
Basophils Relative: 0 %
Eosinophils Absolute: 0.1 K/uL (ref 0.0–0.5)
Eosinophils Relative: 2 %
HCT: 42.7 % (ref 39.0–52.0)
Hemoglobin: 14 g/dL (ref 13.0–17.0)
Immature Granulocytes: 1 %
Lymphocytes Relative: 25 %
Lymphs Abs: 1.9 K/uL (ref 0.7–4.0)
MCH: 29 pg (ref 26.0–34.0)
MCHC: 32.8 g/dL (ref 30.0–36.0)
MCV: 88.6 fL (ref 80.0–100.0)
Monocytes Absolute: 0.6 K/uL (ref 0.1–1.0)
Monocytes Relative: 9 %
Neutro Abs: 4.8 K/uL (ref 1.7–7.7)
Neutrophils Relative %: 63 %
Platelets: 337 K/uL (ref 150–400)
RBC: 4.82 MIL/uL (ref 4.22–5.81)
RDW: 15.1 % (ref 11.5–15.5)
WBC: 7.6 K/uL (ref 4.0–10.5)
nRBC: 0 % (ref 0.0–0.2)

## 2023-11-03 LAB — COMPREHENSIVE METABOLIC PANEL WITH GFR
ALT: 14 U/L (ref 0–44)
AST: 29 U/L (ref 15–41)
Albumin: 3.9 g/dL (ref 3.5–5.0)
Alkaline Phosphatase: 65 U/L (ref 38–126)
Anion gap: 17 — ABNORMAL HIGH (ref 5–15)
BUN: 21 mg/dL (ref 8–23)
CO2: 19 mmol/L — ABNORMAL LOW (ref 22–32)
Calcium: 9.2 mg/dL (ref 8.9–10.3)
Chloride: 104 mmol/L (ref 98–111)
Creatinine, Ser: 1.03 mg/dL (ref 0.61–1.24)
GFR, Estimated: >60 mL/min (ref >60)
Glucose, Bld: 177 mg/dL — ABNORMAL HIGH (ref 70–99)
Potassium: 3.6 mmol/L (ref 3.5–5.1)
Sodium: 140 mmol/L (ref 135–145)
Total Bilirubin: 0.8 mg/dL (ref 0.0–1.2)
Total Protein: 7.7 g/dL (ref 6.5–8.1)

## 2023-11-03 LAB — TROPONIN I (HIGH SENSITIVITY)
Troponin I (High Sensitivity): 8 ng/L (ref <18)
Troponin I (High Sensitivity): 17 ng/L (ref <18)

## 2023-11-03 LAB — MAGNESIUM: Magnesium: 2.1 mg/dL (ref 1.7–2.4)

## 2023-11-03 MED ORDER — DILTIAZEM HCL 25 MG/5ML IV SOLN
10.0000 mg | Freq: Once | INTRAVENOUS | Status: AC
  Administered 2023-11-03: 10 mg via INTRAVENOUS
  Filled 2023-11-03: qty 5

## 2023-11-03 MED ORDER — SODIUM CHLORIDE 0.9 % IV BOLUS
500.0000 mL | Freq: Once | INTRAVENOUS | Status: AC
  Administered 2023-11-03: 500 mL via INTRAVENOUS

## 2023-11-03 NOTE — ED Triage Notes (Signed)
 [facility] [place] Pt suddenly started feeling weak and hot. Initially believed it may be his blood sugar. Tried eating some candy but that did not improve his symptoms. Pt noted to be tachycardic into the 150s. Denies chest pain, denies shortness of breath. Significant cardiac hx including 7 stents.

## 2023-11-03 NOTE — ED Provider Notes (Signed)
 [facility] Provider Note    Event Date/Time   First MD Initiated Contact with Patient 11/03/23 1522     (approximate)   History   Weakness   HPI  Scott FINIGAN Sr. is a 78 y.o. male  who presents to the emergency department today because of concern for acute onset weakness. The patient states that he was at a bank when all of a sudden he felt weak. Felt hot. Denies any chest pain or palpitations. When EMS arrived they found him to be tachycardic. The patient does not recall having issues with fast heart rates in the past and denies history of atrial fibrillation. Did have two cups of coffee today but that is normal for the patient. Denies any recent fevers, nausea and vomiting.       Physical Exam   Triage Vital Signs: ED Triage Vitals  Encounter Vitals Group     BP 11/03/23 1517 (!) 118/91     Girls Systolic BP Percentile --      Girls Diastolic BP Percentile --      Boys Systolic BP Percentile --      Boys Diastolic BP Percentile --      Pulse Rate 11/03/23 1517 (!) 160     Resp 11/03/23 1517 (!) 23     Temp 11/03/23 1517 98.3 F (36.8 C)     Temp Source 11/03/23 1517 Oral     SpO2 11/03/23 1517 94 %     Weight --      Height 11/03/23 1518 6' 1 (1.854 m)     Head Circumference --      Peak Flow --      Pain Score 11/03/23 1518 0     Pain Loc --      Pain Education --      Exclude from Growth Chart --     Most recent vital signs: Vitals:   11/03/23 1517  BP: (!) 118/91  Pulse: (!) 160  Resp: (!) 23  Temp: 98.3 F (36.8 C)  SpO2: 94%   General: Awake, alert, oriented. CV:  Good peripheral perfusion. Tachycardia. Resp:  Normal effort. Lungs clear. Abd:  No distention.    ED Results / Procedures / Treatments   Labs (all labs ordered are listed, but only abnormal results are displayed) Labs Reviewed  COMPREHENSIVE METABOLIC PANEL WITH GFR - Abnormal; Notable for the following components:      Result Value   CO2 19 (*)     Glucose, Bld 177 (*)    Anion gap 17 (*)    All other components within normal limits  CBC WITH DIFFERENTIAL/PLATELET  MAGNESIUM  TROPONIN I (HIGH SENSITIVITY)  TROPONIN I (HIGH SENSITIVITY)     EKG  I, Guadalupe Eagles, attending physician, personally viewed and interpreted this EKG  EKG Time: 1518 Rate: 154 Rhythm: narrow complex tachycardia, question aflutter/afib vs sinus tachycardia Axis: normal Intervals: qtc 479 QRS: narrow ST changes: no st elevation Impression: abnormal EKG  I, Guadalupe Eagles, attending physician, personally viewed and interpreted this EKG  EKG Time: 1550 Rate: 85 Rhythm: sinus rhythm Axis: normal Intervals: qtc 442 QRS: narrow, q waves v2 ST changes: no st elevation Impression: abnormal ekg   RADIOLOGY None   PROCEDURES:  Critical Care performed: No    MEDICATIONS ORDERED IN ED: Medications  diltiazem (CARDIZEM) injection 10 mg (10 mg Intravenous Given 11/03/23 1531)  sodium chloride  0.9 % bolus 500 mL (500 mLs Intravenous New Bag/Given 11/03/23 1530)  IMPRESSION / MDM / ASSESSMENT AND PLAN / ED COURSE  I reviewed the triage vital signs and the nursing notes.                              Differential diagnosis includes, but is not limited to, arrhythmia, ACS, electrolyte abnormality  Patient's presentation is most consistent with acute presentation with potential threat to life or bodily function.   The patient is on the cardiac monitor to evaluate for evidence of arrhythmia and/or significant heart rate changes.  Patient presented to the emergency department today because of concern for acute onset of weakness. EKG here shows narrow complex tachycardia, unclear underlying arrhythmia, however would consider afib/flutter. Will start by giving bolus of diltiazem. Will check blood work.  Shortly after receiving the diltiazem the patient converted back to sinus rhythm. Did feel significant improvement. Awaiting  bloodwork.  Blood work without significant electrolyte abnormality. Troponin negative x 2. Will plan on discharging. Patient is already on blood thinning medication and metoprolol. Do feel it is safe for him to wait to discuss any medication changes with his cardiologist.       FINAL CLINICAL IMPRESSION(S) / ED DIAGNOSES   Final diagnoses:  Atrial fibrillation with RVR (HCC)       Note:  This document was prepared using Dragon voice recognition software and may include unintentional dictation errors.    Floy Roberts, MD 11/03/23 [phone]

## 2024-02-15 IMAGING — CT CT RENAL STONE PROTOCOL
2 of 4 series · 16 of 46 positions shown, 18 images · non-contrast
Comparison: August 16, 2005.

CLINICAL DATA: Left lower quadrant abdominal pain.



[Series 2: stone full standard · axial · 0.89mm/px · z∈[-570,-110]mm · 13 of 102 slices shown, 15 images]
[im 5/102  soft-tissue]
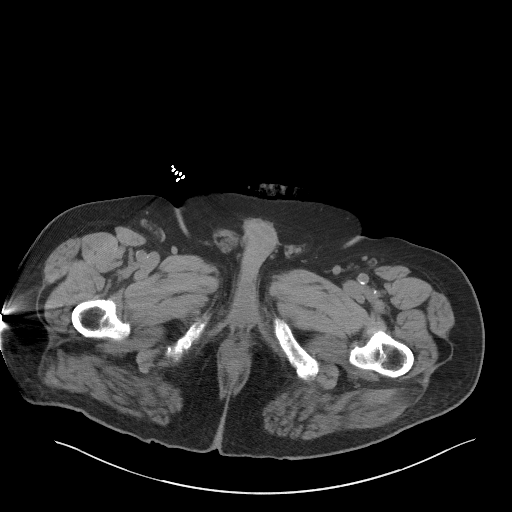
[im 5/102  bone]
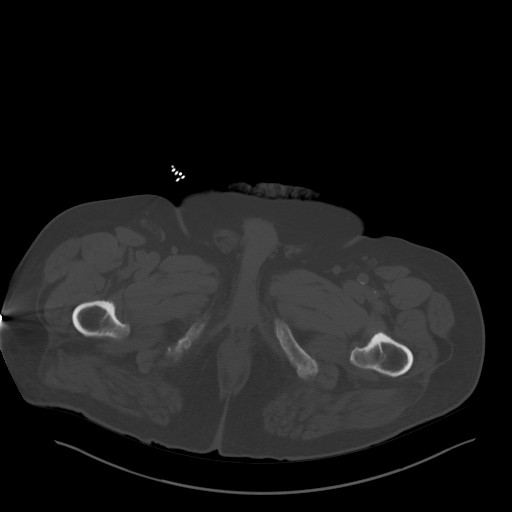
[im 14/102  soft-tissue]
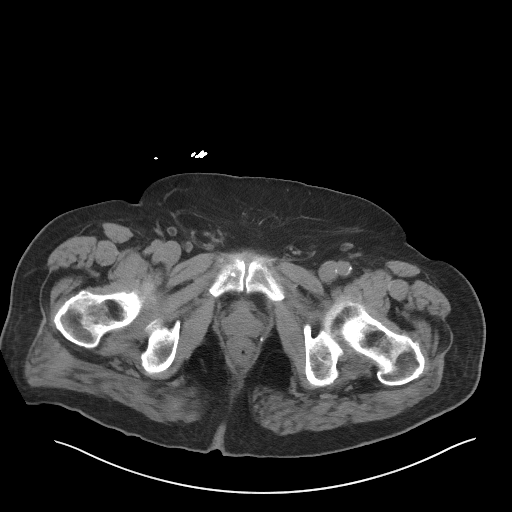
[im 22/102  soft-tissue]
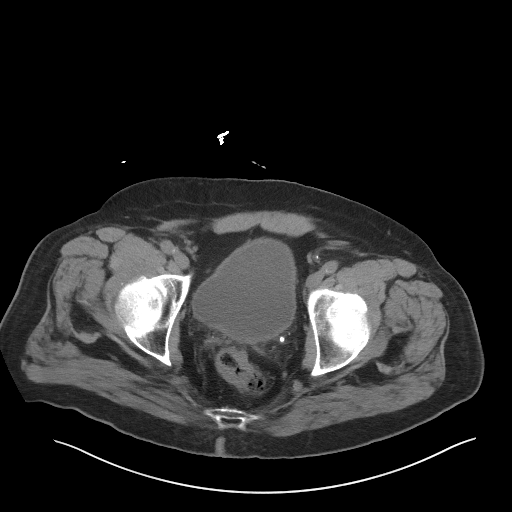
[im 27/102  soft-tissue]
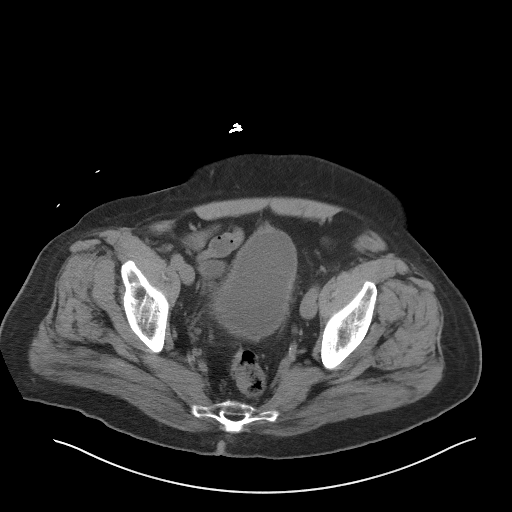
[im 36/102  soft-tissue]
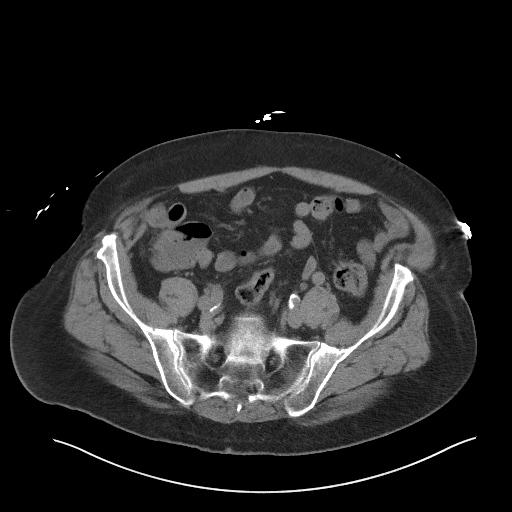
[im 44/102  soft-tissue]
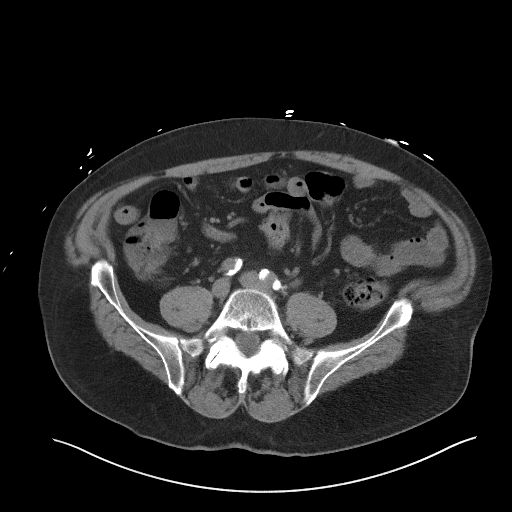
[im 53/102  soft-tissue]
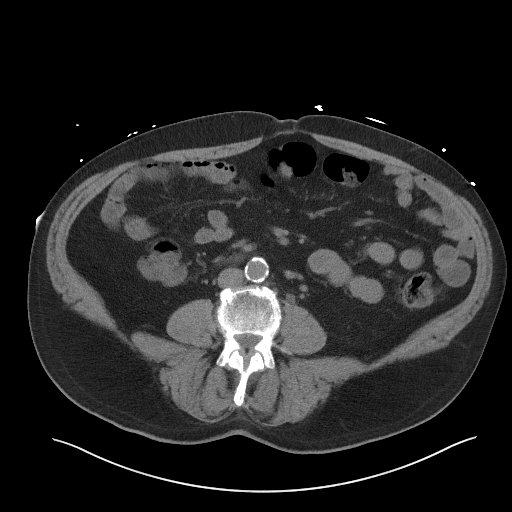
[im 58/102  soft-tissue]
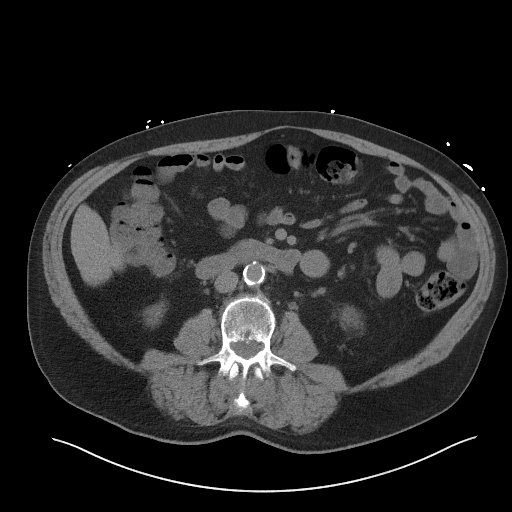
[im 66/102  soft-tissue]
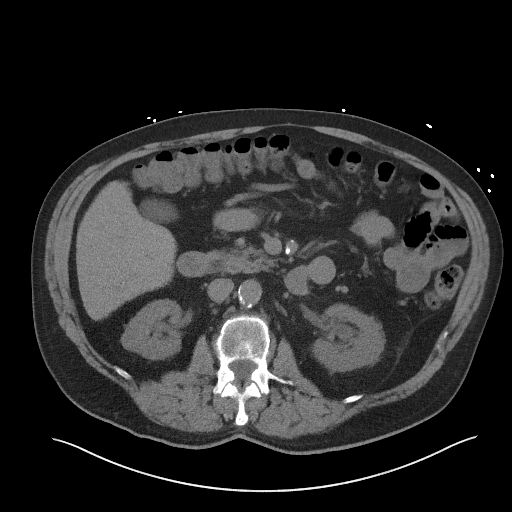
[im 66/102  bone]
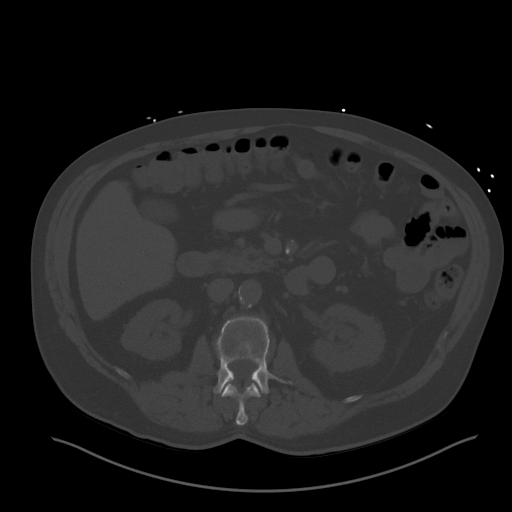
[im 75/102  soft-tissue]
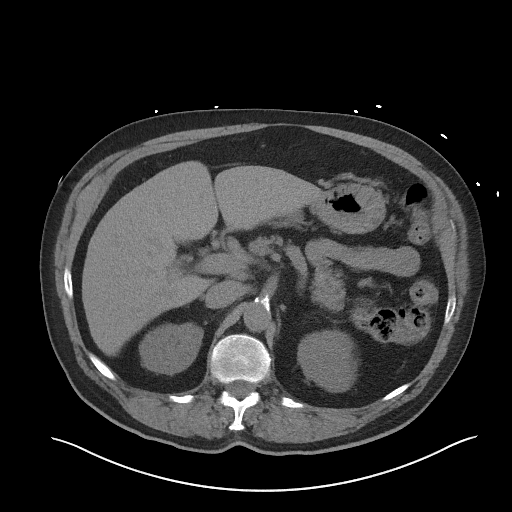
[im 80/102  soft-tissue]
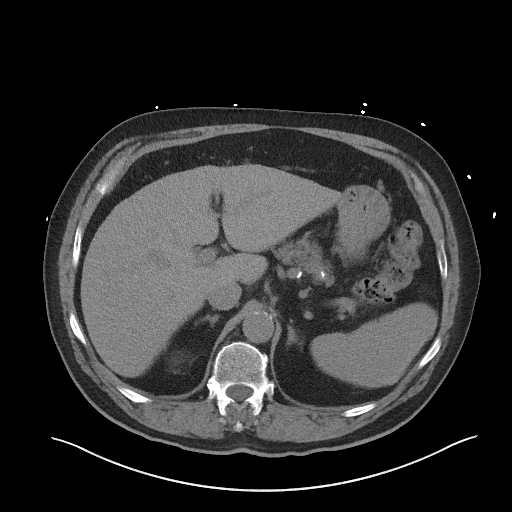
[im 88/102  soft-tissue]
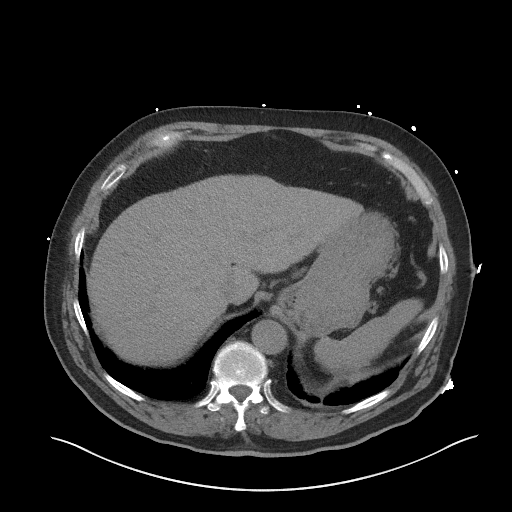
[im 97/102  soft-tissue]
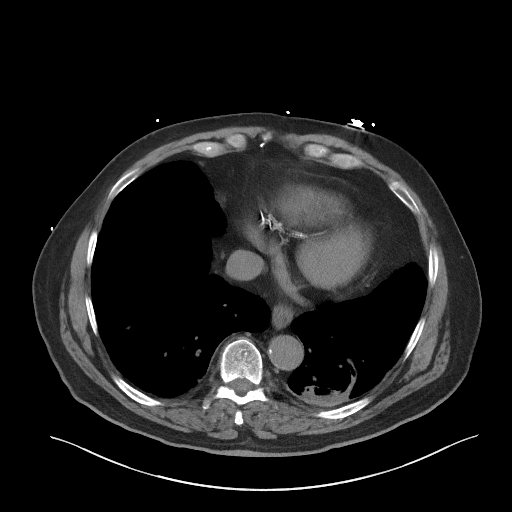

[Series 5: coronal · coronal · 0.88mm/px · 3 of 155 slices shown]
[im 52/155  soft-tissue]
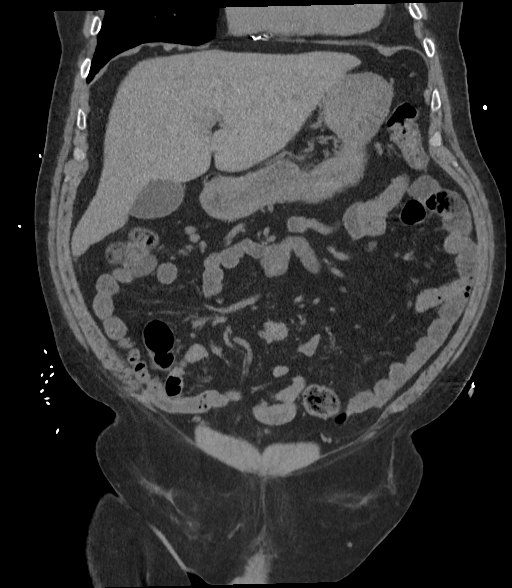
[im 69/155  soft-tissue]
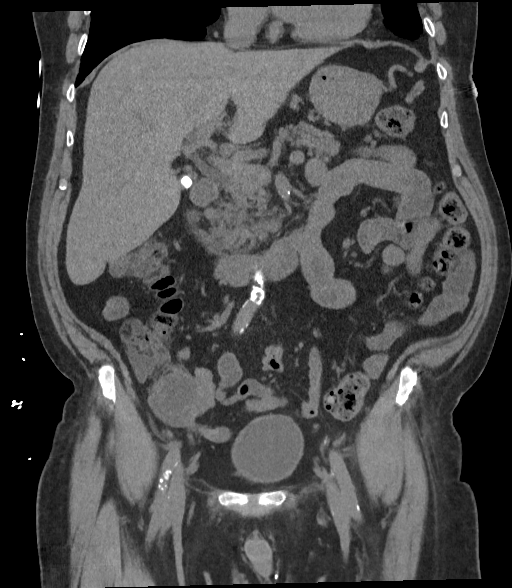
[im 86/155  soft-tissue]
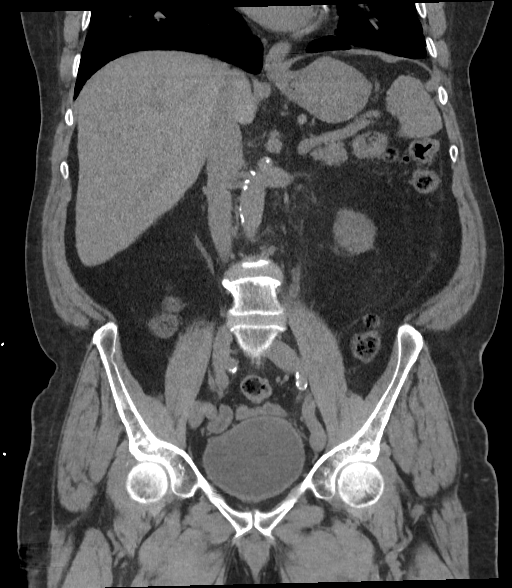

[16 of 46 positions shown; findings below may reference images not displayed]

FINDINGS: Lower chest: Probable scarring or rounded atelectasis is noted in
the left lower lobe.

Hepatobiliary: Cholelithiasis is noted. No biliary dilatation is
noted. The liver is unremarkable.

Pancreas: Unremarkable. No pancreatic ductal dilatation or
surrounding inflammatory changes.

Spleen: Normal in size without focal abnormality.

Adrenals/Urinary Tract: Adrenal glands appear normal. Mild left
hydroureteronephrosis is noted secondary to 5 mm calculus at the
left ureterovesical junction. Urinary bladder is otherwise
unremarkable.

Stomach/Bowel: The stomach appears normal. There is no evidence of
bowel obstruction or inflammation. Status post appendectomy.

Vascular/Lymphatic: Aortic atherosclerosis. No enlarged abdominal or
pelvic lymph nodes.

Reproductive: Prostate is unremarkable.

Other: No abdominal wall hernia or abnormality. No abdominopelvic
ascites.

Musculoskeletal: No acute or significant osseous findings.
IMPRESSION: Mild left hydroureteronephrosis is noted secondary to 5 mm calculus
at the left ureterovesical junction.

Cholelithiasis.

Aortic Atherosclerosis ([id number]).
